# Patient Record
Sex: Male | Born: 1960 | Race: Black or African American | Hispanic: No | Marital: Married | Smoking: Never smoker
Health system: Southern US, Community
[De-identification: ages and names within clinical notes are randomized; demographics above are authoritative.]

## PROBLEM LIST (undated history)

## (undated) DIAGNOSIS — C9001 Multiple myeloma in remission: Secondary | ICD-10-CM

## (undated) HISTORY — PX: OTHER SURGICAL HISTORY: SHX169

## (undated) HISTORY — DX: Multiple myeloma in remission: C90.01

## (undated) HISTORY — PX: BACK SURGERY: SHX140

---

## 2003-06-09 ENCOUNTER — Inpatient Hospital Stay (HOSPITAL_COMMUNITY): Admission: AD | Admit: 2003-06-09 | Discharge: 2003-06-25 | Payer: Self-pay | Admitting: Neurosurgery

## 2003-06-14 ENCOUNTER — Encounter: Payer: Self-pay | Admitting: Hematology & Oncology

## 2003-11-23 ENCOUNTER — Ambulatory Visit (HOSPITAL_COMMUNITY): Admission: RE | Admit: 2003-11-23 | Discharge: 2003-11-23 | Payer: Self-pay | Admitting: Hematology and Oncology

## 2003-12-06 ENCOUNTER — Ambulatory Visit (HOSPITAL_COMMUNITY): Admission: RE | Admit: 2003-12-06 | Discharge: 2003-12-07 | Payer: Self-pay | Admitting: Interventional Radiology

## 2005-11-26 DIAGNOSIS — C9001 Multiple myeloma in remission: Secondary | ICD-10-CM

## 2005-11-26 HISTORY — DX: Multiple myeloma in remission: C90.01

## 2011-10-27 ENCOUNTER — Encounter: Payer: Medicare Other | Admitting: Hematology and Oncology

## 2011-10-27 DIAGNOSIS — C9000 Multiple myeloma not having achieved remission: Secondary | ICD-10-CM

## 2011-10-28 ENCOUNTER — Encounter: Payer: Medicare Other | Admitting: Hematology and Oncology

## 2011-10-28 DIAGNOSIS — M810 Age-related osteoporosis without current pathological fracture: Secondary | ICD-10-CM

## 2011-10-28 DIAGNOSIS — C9000 Multiple myeloma not having achieved remission: Secondary | ICD-10-CM

## 2011-11-25 ENCOUNTER — Encounter: Payer: Medicare Other | Admitting: Internal Medicine

## 2011-11-25 DIAGNOSIS — C9000 Multiple myeloma not having achieved remission: Secondary | ICD-10-CM

## 2011-12-02 ENCOUNTER — Encounter: Payer: Medicare Other | Admitting: Hematology and Oncology

## 2011-12-02 DIAGNOSIS — C9000 Multiple myeloma not having achieved remission: Secondary | ICD-10-CM

## 2012-02-23 DIAGNOSIS — C9000 Multiple myeloma not having achieved remission: Secondary | ICD-10-CM

## 2012-03-04 ENCOUNTER — Encounter: Payer: Medicare Other | Admitting: Hematology and Oncology

## 2012-03-04 DIAGNOSIS — M549 Dorsalgia, unspecified: Secondary | ICD-10-CM

## 2012-03-04 DIAGNOSIS — C9000 Multiple myeloma not having achieved remission: Secondary | ICD-10-CM

## 2012-03-04 DIAGNOSIS — Z23 Encounter for immunization: Secondary | ICD-10-CM

## 2012-04-26 DIAGNOSIS — C9000 Multiple myeloma not having achieved remission: Secondary | ICD-10-CM

## 2012-04-26 DIAGNOSIS — Z452 Encounter for adjustment and management of vascular access device: Secondary | ICD-10-CM

## 2012-05-24 DIAGNOSIS — C9000 Multiple myeloma not having achieved remission: Secondary | ICD-10-CM

## 2012-05-31 ENCOUNTER — Encounter: Payer: Medicare Other | Admitting: Internal Medicine

## 2012-05-31 DIAGNOSIS — K5900 Constipation, unspecified: Secondary | ICD-10-CM

## 2012-05-31 DIAGNOSIS — G893 Neoplasm related pain (acute) (chronic): Secondary | ICD-10-CM

## 2012-05-31 DIAGNOSIS — D649 Anemia, unspecified: Secondary | ICD-10-CM

## 2012-05-31 DIAGNOSIS — C9000 Multiple myeloma not having achieved remission: Secondary | ICD-10-CM

## 2012-07-05 DIAGNOSIS — C9000 Multiple myeloma not having achieved remission: Secondary | ICD-10-CM

## 2012-07-05 DIAGNOSIS — Z452 Encounter for adjustment and management of vascular access device: Secondary | ICD-10-CM

## 2012-08-23 DIAGNOSIS — C9000 Multiple myeloma not having achieved remission: Secondary | ICD-10-CM

## 2012-09-01 ENCOUNTER — Encounter: Payer: Medicare Other | Admitting: Internal Medicine

## 2012-09-01 DIAGNOSIS — D649 Anemia, unspecified: Secondary | ICD-10-CM

## 2012-09-01 DIAGNOSIS — G893 Neoplasm related pain (acute) (chronic): Secondary | ICD-10-CM

## 2012-09-01 DIAGNOSIS — C9000 Multiple myeloma not having achieved remission: Secondary | ICD-10-CM

## 2012-09-27 ENCOUNTER — Encounter: Payer: Medicare Other | Admitting: Internal Medicine

## 2012-09-27 DIAGNOSIS — C9000 Multiple myeloma not having achieved remission: Secondary | ICD-10-CM

## 2012-11-10 DIAGNOSIS — C9000 Multiple myeloma not having achieved remission: Secondary | ICD-10-CM

## 2012-11-24 ENCOUNTER — Encounter: Payer: Medicare Other | Admitting: Internal Medicine

## 2012-11-24 DIAGNOSIS — M549 Dorsalgia, unspecified: Secondary | ICD-10-CM

## 2012-11-24 DIAGNOSIS — Z9484 Stem cells transplant status: Secondary | ICD-10-CM

## 2012-11-24 DIAGNOSIS — C8291 Follicular lymphoma, unspecified, lymph nodes of head, face, and neck: Secondary | ICD-10-CM

## 2013-01-04 DIAGNOSIS — C9000 Multiple myeloma not having achieved remission: Secondary | ICD-10-CM

## 2013-01-04 DIAGNOSIS — Z452 Encounter for adjustment and management of vascular access device: Secondary | ICD-10-CM

## 2013-01-05 ENCOUNTER — Encounter: Payer: Self-pay | Admitting: *Deleted

## 2013-01-05 NOTE — Progress Notes (Signed)
Received confirmation of Revlimid shipment from [employer]

## 2013-02-02 ENCOUNTER — Telehealth: Payer: Self-pay | Admitting: *Deleted

## 2013-02-02 NOTE — Telephone Encounter (Signed)
[facility] faxed Revlimid refill request.  Request to provider for review. 

## 2013-02-03 ENCOUNTER — Telehealth: Payer: Self-pay

## 2013-02-03 NOTE — Telephone Encounter (Signed)
S/w Genia Del SW at [facility]. They are taking care of pt. I faxed revlimid refil request to  [phone].

## 2013-02-15 DIAGNOSIS — C9000 Multiple myeloma not having achieved remission: Secondary | ICD-10-CM

## 2013-02-15 DIAGNOSIS — D801 Nonfamilial hypogammaglobulinemia: Secondary | ICD-10-CM

## 2013-02-15 DIAGNOSIS — B009 Herpesviral infection, unspecified: Secondary | ICD-10-CM

## 2013-03-21 DIAGNOSIS — Z452 Encounter for adjustment and management of vascular access device: Secondary | ICD-10-CM

## 2013-03-21 DIAGNOSIS — C9000 Multiple myeloma not having achieved remission: Secondary | ICD-10-CM

## 2013-04-26 DIAGNOSIS — G8929 Other chronic pain: Secondary | ICD-10-CM | POA: Insufficient documentation

## 2013-05-09 DIAGNOSIS — C9000 Multiple myeloma not having achieved remission: Secondary | ICD-10-CM

## 2013-05-11 DIAGNOSIS — R609 Edema, unspecified: Secondary | ICD-10-CM

## 2013-05-11 DIAGNOSIS — Z23 Encounter for immunization: Secondary | ICD-10-CM

## 2013-05-11 DIAGNOSIS — C9000 Multiple myeloma not having achieved remission: Secondary | ICD-10-CM

## 2013-05-24 DIAGNOSIS — Z95828 Presence of other vascular implants and grafts: Secondary | ICD-10-CM | POA: Insufficient documentation

## 2013-12-20 DIAGNOSIS — N529 Male erectile dysfunction, unspecified: Secondary | ICD-10-CM | POA: Insufficient documentation

## 2014-01-19 ENCOUNTER — Encounter: Payer: Self-pay | Admitting: Oncology

## 2014-02-08 ENCOUNTER — Encounter: Payer: Self-pay | Admitting: Oncology

## 2014-06-30 DIAGNOSIS — Z95828 Presence of other vascular implants and grafts: Secondary | ICD-10-CM | POA: Diagnosis not present

## 2014-06-30 DIAGNOSIS — C9000 Multiple myeloma not having achieved remission: Secondary | ICD-10-CM | POA: Diagnosis not present

## 2014-07-06 DIAGNOSIS — M272 Inflammatory conditions of jaws: Secondary | ICD-10-CM | POA: Diagnosis not present

## 2014-08-07 DIAGNOSIS — Z95828 Presence of other vascular implants and grafts: Secondary | ICD-10-CM | POA: Diagnosis not present

## 2014-08-07 DIAGNOSIS — C9000 Multiple myeloma not having achieved remission: Secondary | ICD-10-CM | POA: Diagnosis not present

## 2014-08-14 DIAGNOSIS — M25512 Pain in left shoulder: Secondary | ICD-10-CM | POA: Diagnosis not present

## 2014-08-14 DIAGNOSIS — G8929 Other chronic pain: Secondary | ICD-10-CM | POA: Insufficient documentation

## 2014-08-14 DIAGNOSIS — C9000 Multiple myeloma not having achieved remission: Secondary | ICD-10-CM | POA: Diagnosis not present

## 2014-08-14 DIAGNOSIS — M25511 Pain in right shoulder: Secondary | ICD-10-CM

## 2014-08-21 DIAGNOSIS — M7500 Adhesive capsulitis of unspecified shoulder: Secondary | ICD-10-CM | POA: Diagnosis not present

## 2014-11-09 DIAGNOSIS — C9000 Multiple myeloma not having achieved remission: Secondary | ICD-10-CM | POA: Diagnosis not present

## 2014-11-10 DIAGNOSIS — Z95828 Presence of other vascular implants and grafts: Secondary | ICD-10-CM | POA: Diagnosis not present

## 2014-11-10 DIAGNOSIS — C9000 Multiple myeloma not having achieved remission: Secondary | ICD-10-CM | POA: Diagnosis not present

## 2014-11-29 DIAGNOSIS — M272 Inflammatory conditions of jaws: Secondary | ICD-10-CM | POA: Diagnosis not present

## 2014-11-29 DIAGNOSIS — M87180 Osteonecrosis due to drugs, jaw: Secondary | ICD-10-CM | POA: Diagnosis not present

## 2014-11-29 DIAGNOSIS — R9431 Abnormal electrocardiogram [ECG] [EKG]: Secondary | ICD-10-CM | POA: Diagnosis not present

## 2014-11-29 DIAGNOSIS — T458X5A Adverse effect of other primarily systemic and hematological agents, initial encounter: Secondary | ICD-10-CM | POA: Diagnosis not present

## 2014-12-04 DIAGNOSIS — Z95828 Presence of other vascular implants and grafts: Secondary | ICD-10-CM | POA: Diagnosis not present

## 2014-12-04 DIAGNOSIS — C9000 Multiple myeloma not having achieved remission: Secondary | ICD-10-CM | POA: Diagnosis not present

## 2014-12-12 DIAGNOSIS — C412 Malignant neoplasm of vertebral column: Secondary | ICD-10-CM | POA: Diagnosis not present

## 2014-12-12 DIAGNOSIS — M549 Dorsalgia, unspecified: Secondary | ICD-10-CM | POA: Diagnosis not present

## 2014-12-12 DIAGNOSIS — M879 Osteonecrosis, unspecified: Secondary | ICD-10-CM | POA: Diagnosis not present

## 2014-12-12 DIAGNOSIS — M009 Pyogenic arthritis, unspecified: Secondary | ICD-10-CM | POA: Diagnosis not present

## 2014-12-12 DIAGNOSIS — M8788 Other osteonecrosis, other site: Secondary | ICD-10-CM | POA: Diagnosis not present

## 2014-12-12 DIAGNOSIS — C9000 Multiple myeloma not having achieved remission: Secondary | ICD-10-CM | POA: Diagnosis not present

## 2014-12-12 DIAGNOSIS — G8929 Other chronic pain: Secondary | ICD-10-CM | POA: Diagnosis not present

## 2014-12-12 DIAGNOSIS — Z9221 Personal history of antineoplastic chemotherapy: Secondary | ICD-10-CM | POA: Diagnosis not present

## 2014-12-12 DIAGNOSIS — M272 Inflammatory conditions of jaws: Secondary | ICD-10-CM | POA: Diagnosis not present

## 2014-12-28 DIAGNOSIS — M272 Inflammatory conditions of jaws: Secondary | ICD-10-CM | POA: Diagnosis not present

## 2015-02-21 DIAGNOSIS — C9000 Multiple myeloma not having achieved remission: Secondary | ICD-10-CM | POA: Diagnosis not present

## 2015-02-22 DIAGNOSIS — Z95828 Presence of other vascular implants and grafts: Secondary | ICD-10-CM | POA: Diagnosis not present

## 2015-02-22 DIAGNOSIS — C9000 Multiple myeloma not having achieved remission: Secondary | ICD-10-CM | POA: Diagnosis not present

## 2015-02-26 DIAGNOSIS — C9000 Multiple myeloma not having achieved remission: Secondary | ICD-10-CM | POA: Diagnosis not present

## 2015-04-06 DIAGNOSIS — Z95828 Presence of other vascular implants and grafts: Secondary | ICD-10-CM | POA: Diagnosis not present

## 2015-07-02 DIAGNOSIS — C9001 Multiple myeloma in remission: Secondary | ICD-10-CM | POA: Diagnosis not present

## 2015-07-02 DIAGNOSIS — M25511 Pain in right shoulder: Secondary | ICD-10-CM | POA: Diagnosis not present

## 2015-07-02 DIAGNOSIS — C9000 Multiple myeloma not having achieved remission: Secondary | ICD-10-CM | POA: Diagnosis not present

## 2015-07-02 DIAGNOSIS — G8929 Other chronic pain: Secondary | ICD-10-CM | POA: Diagnosis not present

## 2015-08-24 DIAGNOSIS — Z95828 Presence of other vascular implants and grafts: Secondary | ICD-10-CM | POA: Diagnosis not present

## 2015-09-19 DIAGNOSIS — C9000 Multiple myeloma not having achieved remission: Secondary | ICD-10-CM | POA: Diagnosis not present

## 2015-09-24 DIAGNOSIS — M546 Pain in thoracic spine: Secondary | ICD-10-CM | POA: Diagnosis not present

## 2015-09-24 DIAGNOSIS — C9001 Multiple myeloma in remission: Secondary | ICD-10-CM | POA: Diagnosis not present

## 2015-09-24 DIAGNOSIS — M7501 Adhesive capsulitis of right shoulder: Secondary | ICD-10-CM | POA: Diagnosis not present

## 2015-09-24 DIAGNOSIS — G8929 Other chronic pain: Secondary | ICD-10-CM | POA: Diagnosis not present

## 2015-10-29 DIAGNOSIS — Z95828 Presence of other vascular implants and grafts: Secondary | ICD-10-CM | POA: Diagnosis not present

## 2015-10-29 DIAGNOSIS — C9000 Multiple myeloma not having achieved remission: Secondary | ICD-10-CM | POA: Diagnosis not present

## 2015-11-27 ENCOUNTER — Other Ambulatory Visit (HOSPITAL_COMMUNITY): Payer: Self-pay | Admitting: Oncology

## 2015-11-27 ENCOUNTER — Encounter (HOSPITAL_COMMUNITY): Payer: Self-pay | Admitting: Oncology

## 2015-11-27 DIAGNOSIS — C9001 Multiple myeloma in remission: Secondary | ICD-10-CM | POA: Insufficient documentation

## 2015-11-29 ENCOUNTER — Other Ambulatory Visit (HOSPITAL_COMMUNITY): Payer: Self-pay | Admitting: Oncology

## 2015-11-29 DIAGNOSIS — C9001 Multiple myeloma in remission: Secondary | ICD-10-CM

## 2015-12-26 ENCOUNTER — Encounter (HOSPITAL_COMMUNITY): Payer: Medicare Other | Attending: Hematology & Oncology

## 2015-12-26 DIAGNOSIS — C9001 Multiple myeloma in remission: Secondary | ICD-10-CM | POA: Insufficient documentation

## 2015-12-26 DIAGNOSIS — C9000 Multiple myeloma not having achieved remission: Secondary | ICD-10-CM | POA: Diagnosis not present

## 2015-12-26 DIAGNOSIS — Z452 Encounter for adjustment and management of vascular access device: Secondary | ICD-10-CM | POA: Diagnosis not present

## 2015-12-26 LAB — COMPREHENSIVE METABOLIC PANEL
ALT: 16 U/L — ABNORMAL LOW (ref 17–63)
AST: 22 U/L (ref 15–41)
Albumin: 4.1 g/dL (ref 3.5–5.0)
Alkaline Phosphatase: 54 U/L (ref 38–126)
Anion gap: 6 (ref 5–15)
BUN: 14 mg/dL (ref 6–20)
CO2: 26 mmol/L (ref 22–32)
Calcium: 8.2 mg/dL — ABNORMAL LOW (ref 8.9–10.3)
Chloride: 106 mmol/L (ref 101–111)
Creatinine, Ser: 0.86 mg/dL (ref 0.61–1.24)
GFR calc Af Amer: >60 mL/min (ref >60)
GFR calc non Af Amer: >60 mL/min (ref >60)
Glucose, Bld: 88 mg/dL (ref 65–99)
Potassium: 3.7 mmol/L (ref 3.5–5.1)
Sodium: 138 mmol/L (ref 135–145)
Total Bilirubin: 1.3 mg/dL — ABNORMAL HIGH (ref 0.3–1.2)
Total Protein: 7.1 g/dL (ref 6.5–8.1)

## 2015-12-26 LAB — CBC WITH DIFFERENTIAL/PLATELET
Basophils Absolute: 0 10*3/uL (ref 0.0–0.1)
Basophils Relative: 0 %
Eosinophils Absolute: 0.1 10*3/uL (ref 0.0–0.7)
Eosinophils Relative: 2 %
HCT: 39.6 % (ref 39.0–52.0)
Hemoglobin: 12.3 g/dL — ABNORMAL LOW (ref 13.0–17.0)
Lymphocytes Relative: 56 %
Lymphs Abs: 2.4 10*3/uL (ref 0.7–4.0)
MCH: 23.5 pg — ABNORMAL LOW (ref 26.0–34.0)
MCHC: 31.1 g/dL (ref 30.0–36.0)
MCV: 75.6 fL — ABNORMAL LOW (ref 78.0–100.0)
Monocytes Absolute: 0.4 10*3/uL (ref 0.1–1.0)
Monocytes Relative: 9 %
Neutro Abs: 1.4 10*3/uL — ABNORMAL LOW (ref 1.7–7.7)
Neutrophils Relative %: 33 %
Platelets: 138 10*3/uL — ABNORMAL LOW (ref 150–400)
RBC: 5.24 MIL/uL (ref 4.22–5.81)
RDW: 16.2 % — ABNORMAL HIGH (ref 11.5–15.5)
WBC: 4.2 10*3/uL (ref 4.0–10.5)

## 2015-12-26 LAB — LACTATE DEHYDROGENASE: LDH: 155 U/L (ref 98–192)

## 2015-12-26 LAB — SEDIMENTATION RATE: Sed Rate: 2 mm/hr (ref 0–16)

## 2015-12-26 LAB — C-REACTIVE PROTEIN: CRP: <0.5 mg/dL (ref <1.0)

## 2015-12-26 MED ORDER — HEPARIN SOD (PORK) LOCK FLUSH 100 UNIT/ML IV SOLN
500.0000 [IU] | Freq: Once | INTRAVENOUS | Status: AC
  Administered 2015-12-26: 500 [IU] via INTRAVENOUS
  Filled 2015-12-26: qty 5

## 2015-12-26 MED ORDER — METHADONE HCL 10 MG PO TABS: 10.0000 mg | ORAL_TABLET | Freq: Two times a day (BID) | ORAL | Status: DC

## 2015-12-26 MED ORDER — CHLORHEXIDINE GLUCONATE 0.12 % MT SOLN: 15.0000 mL | Freq: Two times a day (BID) | OROMUCOSAL | Status: DC

## 2015-12-26 MED ORDER — SODIUM CHLORIDE 0.9% FLUSH
10.0000 mL | INTRAVENOUS | Status: DC | PRN
  Administered 2015-12-26: 10 mL via INTRAVENOUS
  Filled 2015-12-26: qty 10

## 2015-12-26 NOTE — Patient Instructions (Addendum)
[place] at [facility] Discharge Instructions  RECOMMENDATIONS MADE BY THE CONSULTANT AND ANY TEST RESULTS WILL BE SENT TO YOUR REFERRING PHYSICIAN.  You had your port flushed with labs. We refilled your pain medicine today. I sent a refill on your mouthwash to the pharmacy. Follow up as scheduled. Call for any concerns or questions.  Thank you for choosing [place] at [facility] to provide your oncology and hematology care.  To afford each patient quality time with our provider, please arrive at least 15 minutes before your scheduled appointment time.   Beginning January 23rd 2017 lab work for the [facility] will be done in the  Main lab at [facility] on 1st floor. If you have a lab appointment with the [facility] please come in thru the  Main Entrance and check in at the main information desk  You need to re-schedule your appointment should you arrive 10 or more minutes late.  We strive to give you quality time with our providers, and arriving late affects you and other patients whose appointments are after yours.  Also, if you no show three or more times for appointments you may be dismissed from the clinic at the providers discretion.     Again, thank you for choosing [facility].  Our hope is that these requests will decrease the amount of time that you wait before being seen by our physicians.       _____________________________________________________________  Should you have questions after your visit to [facility], please contact our office at [phone] between the hours of 8:30 a.m. and 4:30 p.m.  Voicemails left after 4:30 p.m. will not be returned until the following business day.  For prescription refill requests, have your pharmacy contact our office.         Resources For Cancer Patients and their Caregivers ? American Cancer Society: Can assist with transportation, wigs, general  needs, runs Look Good Feel Better.        (223) 209-0905 ? Cancer Care: Provides financial assistance, online support groups, medication/co-pay assistance.  1-800-813-HOPE (559) 721-6258) ? [facility] Assists [place] cancer patients and their families through emotional , educational and financial support.  725-778-4776 ? [place] DSS Where to apply for food stamps, Medicaid and utility assistance. 269-535-4234 ? RCATS: Transportation to medical appointments. (763)506-8054 ? Social Security Administration: May apply for disability if have a Stage IV cancer. 4161599642 6802960716 ? [employer], Disability and Transit Services: Assists with nutrition, care and transit needs. [place] Support Programs: @10RELATIVEDAYS @ > Cancer Support Group  2nd Tuesday of the month 1pm-2pm, Journey Room  > Creative Journey  3rd Tuesday of the month 1130am-1pm, Journey Room  > Look Good Feel Better  1st Wednesday of the month 10am-12 noon, Journey Room (Call American Cancer Society to register [phone])        24-Hour Urine Collection HOW DO I DO A 24-HOUR URINE COLLECTION?  When you get up in the morning, urinate in the toilet and flush. Write down the time. This will be your start time on the day of collection and your end time on the next morning.  From then on, collect all of your urine in the plastic jug that is given to you.  Stop collecting your urine 24 hours after you started.  If the plastic jug that is given to you already has liquid in it, that is okay.  Do not throw out the liquid or rinse out the jug. Some tests need the liquid to be added to your urine.  Keep your plastic jug cool in an ice chest or keep it in the refrigerator during the test.  When 24 hours are over, bring your plastic jug to the clinic lab. Keep the jug cool in an ice chest while you are bringing it to the lab.   This information is not intended  to replace advice given to you by your health care provider. Make sure you discuss any questions you have with your health care provider.   Document Released: 08/29/2008 Document Revised: 06/23/2014 Document Reviewed: 10/26/2013 Elsevier Interactive Patient Education [employer].

## 2015-12-26 NOTE — Progress Notes (Signed)
Steven [name] presented for Portacath access and flush. Portacath located right chest wall accessed with  H 20 needle. Good blood return present. Portacath flushed with 23ml NS and 500U/13ml Heparin and needle removed intact. Procedure without incident. Patient tolerated procedure well.  Labs drawn per order

## 2015-12-27 LAB — IGG, IGA, IGM
IgA: 207 mg/dL (ref 90–386)
IgG (Immunoglobin G), Serum: 1087 mg/dL (ref 700–1600)
IgM, Serum: 89 mg/dL (ref 20–172)

## 2015-12-27 LAB — BETA 2 MICROGLOBULIN, SERUM: Beta-2 Microglobulin: 1.6 mg/L (ref 0.6–2.4)

## 2015-12-27 LAB — KAPPA/LAMBDA LIGHT CHAINS
Kappa free light chain: 14.6 mg/L (ref 3.3–19.4)
Kappa, lambda light chain ratio: 1.12 (ref 0.26–1.65)
Lambda free light chains: 13 mg/L (ref 5.7–26.3)

## 2015-12-28 LAB — PROTEIN ELECTROPHORESIS, SERUM
A/G Ratio: 1.3 (ref 0.7–1.7)
Albumin ELP: 3.8 g/dL (ref 2.9–4.4)
Alpha-1-Globulin: 0.2 g/dL (ref 0.0–0.4)
Alpha-2-Globulin: 0.5 g/dL (ref 0.4–1.0)
Beta Globulin: 1.2 g/dL (ref 0.7–1.3)
Gamma Globulin: 1.2 g/dL (ref 0.4–1.8)
Globulin, Total: 3 g/dL (ref 2.2–3.9)
Total Protein ELP: 6.8 g/dL (ref 6.0–8.5)

## 2015-12-28 LAB — IMMUNOFIXATION ELECTROPHORESIS
IgA: 214 mg/dL (ref 90–386)
IgG (Immunoglobin G), Serum: 1093 mg/dL (ref 700–1600)
IgM, Serum: 91 mg/dL (ref 20–172)
Total Protein ELP: 6.7 g/dL (ref 6.0–8.5)

## 2016-01-04 ENCOUNTER — Other Ambulatory Visit (HOSPITAL_COMMUNITY): Payer: Self-pay | Admitting: *Deleted

## 2016-01-04 DIAGNOSIS — C9001 Multiple myeloma in remission: Secondary | ICD-10-CM

## 2016-01-04 LAB — PROTEIN, URINE, 24 HOUR
Collection Interval-UPROT: 24 hours
Protein, 24H Urine: 63 mg/d (ref 50–100)
Protein, Urine: 7 mg/dL
Urine Total Volume-UPROT: 900 mL

## 2016-01-23 ENCOUNTER — Ambulatory Visit (HOSPITAL_COMMUNITY): Payer: Medicare Other | Admitting: Hematology & Oncology

## 2016-01-28 ENCOUNTER — Other Ambulatory Visit (HOSPITAL_COMMUNITY): Payer: Self-pay | Admitting: Oncology

## 2016-01-28 DIAGNOSIS — C9001 Multiple myeloma in remission: Secondary | ICD-10-CM

## 2016-01-28 MED ORDER — METHADONE HCL 10 MG PO TABS
10.0000 mg | ORAL_TABLET | Freq: Two times a day (BID) | ORAL | 0 refills | Status: DC
Start: 2016-01-28 — End: 2016-02-26

## 2016-02-26 ENCOUNTER — Encounter (HOSPITAL_COMMUNITY): Payer: Self-pay | Admitting: Oncology

## 2016-02-26 ENCOUNTER — Encounter (HOSPITAL_COMMUNITY): Payer: Medicare Other | Attending: Oncology | Admitting: Oncology

## 2016-02-26 VITALS — BP 163/80 | HR 66 | Temp 99.3°F | Resp 18 | Ht 66.0 in | Wt 172.6 lb

## 2016-02-26 DIAGNOSIS — D509 Iron deficiency anemia, unspecified: Secondary | ICD-10-CM

## 2016-02-26 DIAGNOSIS — C9001 Multiple myeloma in remission: Secondary | ICD-10-CM | POA: Diagnosis not present

## 2016-02-26 DIAGNOSIS — Z95828 Presence of other vascular implants and grafts: Secondary | ICD-10-CM

## 2016-02-26 DIAGNOSIS — D61818 Other pancytopenia: Secondary | ICD-10-CM

## 2016-02-26 LAB — IRON AND TIBC
Iron: 89 ug/dL (ref 45–182)
Saturation Ratios: 27 % (ref 17.9–39.5)
TIBC: 333 ug/dL (ref 250–450)
UIBC: 244 ug/dL

## 2016-02-26 LAB — COMPREHENSIVE METABOLIC PANEL
ALT: 14 U/L — ABNORMAL LOW (ref 17–63)
AST: 25 U/L (ref 15–41)
Albumin: 4.2 g/dL (ref 3.5–5.0)
Alkaline Phosphatase: 52 U/L (ref 38–126)
Anion gap: 3 — ABNORMAL LOW (ref 5–15)
BUN: 15 mg/dL (ref 6–20)
CO2: 25 mmol/L (ref 22–32)
Calcium: 8 mg/dL — ABNORMAL LOW (ref 8.9–10.3)
Chloride: 106 mmol/L (ref 101–111)
Creatinine, Ser: 0.81 mg/dL (ref 0.61–1.24)
GFR calc Af Amer: >60 mL/min (ref >60)
GFR calc non Af Amer: >60 mL/min (ref >60)
Glucose, Bld: 134 mg/dL — ABNORMAL HIGH (ref 65–99)
Potassium: 3.7 mmol/L (ref 3.5–5.1)
Sodium: 134 mmol/L — ABNORMAL LOW (ref 135–145)
Total Bilirubin: 1.2 mg/dL (ref 0.3–1.2)
Total Protein: 7.5 g/dL (ref 6.5–8.1)

## 2016-02-26 LAB — LACTATE DEHYDROGENASE: LDH: 198 U/L — ABNORMAL HIGH (ref 98–192)

## 2016-02-26 LAB — FERRITIN: Ferritin: 80 ng/mL (ref 24–336)

## 2016-02-26 MED ORDER — METHADONE HCL 10 MG PO TABS: 10.0000 mg | ORAL_TABLET | Freq: Two times a day (BID) | ORAL | 0 refills | Status: DC

## 2016-02-26 MED ORDER — HEPARIN SOD (PORK) LOCK FLUSH 100 UNIT/ML IV SOLN
500.0000 [IU] | Freq: Once | INTRAVENOUS | Status: DC
  Filled 2016-02-26: qty 5

## 2016-02-26 MED ORDER — SODIUM CHLORIDE 0.9% FLUSH: 10.0000 mL | INTRAVENOUS | Status: DC | PRN

## 2016-02-26 NOTE — Patient Instructions (Signed)
[place] at [facility] Discharge Instructions  RECOMMENDATIONS MADE BY THE CONSULTANT AND ANY TEST RESULTS WILL BE SENT TO YOUR REFERRING PHYSICIAN.  Port flush and labs today.  We will update you with results when they are available. You were provided a 3 month supply of your pain medication. Port flush in 6 weeks. Port flush and labs in 12 weeks. Return in 12 weeks for follow-up. You declined Influenza vaccine today. Please call us with any questions or concerns.  Thank you for choosing [place] at [facility] to provide your oncology and hematology care.  To afford each patient quality time with our provider, please arrive at least 15 minutes before your scheduled appointment time.   Beginning January 23rd 2017 lab work for the [facility] will be done in the  Main lab at [facility] on 1st floor. If you have a lab appointment with the [facility] please come in thru the  Main Entrance and check in at the main information desk  You need to re-schedule your appointment should you arrive 10 or more minutes late.  We strive to give you quality time with our providers, and arriving late affects you and other patients whose appointments are after yours.  Also, if you no show three or more times for appointments you may be dismissed from the clinic at the providers discretion.     Again, thank you for choosing [facility].  Our hope is that these requests will decrease the amount of time that you wait before being seen by our physicians.       _____________________________________________________________  Should you have questions after your visit to [facility], please contact our office at [phone] between the hours of 8:30 a.m. and 4:30 p.m.  Voicemails left after 4:30 p.m. will not be returned until the following business day.  For prescription refill requests, have your pharmacy contact our office.          Resources For Cancer Patients and their Caregivers ? American Cancer Society: Can assist with transportation, wigs, general needs, runs Look Good Feel Better.        272-344-5790 ? Cancer Care: Provides financial assistance, online support groups, medication/co-pay assistance.  1-800-813-HOPE 778 498 8925) ? [facility] Assists [place] cancer patients and their families through emotional , educational and financial support.  508-447-8359 ? [place] DSS Where to apply for food stamps, Medicaid and utility assistance. 306-108-1051 ? RCATS: Transportation to medical appointments. 872 168 2670 ? Social Security Administration: May apply for disability if have a Stage IV cancer. 6676863854 779 728 5528 ? [employer], Disability and Transit Services: Assists with nutrition, care and transit needs. [place] Support Programs: @10RELATIVEDAYS @ > Cancer Support Group  2nd Tuesday of the month 1pm-2pm, Journey Room  > Creative Journey  3rd Tuesday of the month 1130am-1pm, Journey Room  > Look Good Feel Better  1st Wednesday of the month 10am-12 noon, Journey Room (Call [facility] to register [phone])

## 2016-02-26 NOTE — Progress Notes (Signed)
[facility] Hematology/Oncology Consultation   Name: Steven [name]      MRN: [id number]    Date: 02/26/2016 Time:10:49 PM   REFERRING PHYSICIAN:  Everardo All, MD (Medical Oncology at [facility])  [place]:  Transfer of medical oncology care.   DIAGNOSIS:  Multiple myeloma, kappa light chain, presenting with back pain as a result of T2 vertebral body fracture    Multiple myeloma in remission ([place])   06/05/2003 Initial Diagnosis    Myeloma, presenting with excruciating back pain and T12 vertebral body fracture, kappa light chain multiple myeloma diagnosed with 2030 mg light chains per 24 hours and urine      06/09/2003 - 06/22/2003 Radiation Therapy    19 Gray to T11-L1 or T12 vertebral body fracture. Sublimity T2-T4 for T3 vertebral body involvement      06/26/2003 - 07/25/2004 Chemotherapy    Doxil, Velcade, and dexamethasone       02/27/2004 Bone Marrow Biopsy    Normal cellular bone marrow showing trilineage hematopoiesis with less than 10% plasma cells.      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  Followed by [facility] Department of dentistry.      06/22/2008 Progression    Relapse with The light chains in 24-hour urine found, therapy reinitiated with Revlimid/dexamethasone.      07/31/2008 - 08/10/2013 Chemotherapy    Revlimid/dexamethasone initiated for recurrent disease.       08/27/2010 - 09/10/2010 Radiation Therapy    2500 cGy to right hip for impending right femoral neck fracture      08/04/2013 Imaging    Bone density-normal with T score of -1.0.      01/19/2014 Imaging    MRI pelvis-partially visualized right L4-L5 and L5-S1 facet arthritis with periarticular edema. This can be associated with referred pain to the right hip. Small abnormal  marrow signal foci in the proximal right femur are likely representing treated myeloma. There is no cortical erosion, stress reaction or stress fracture. Similar lesions are present in the right supra-acetabular ileum.      02/08/2014 Imaging    Bone survey-no significant change in widespread myelomatous involvement of skeleton. Multiple pathologic fractures in the thoracic spine at L1 appears stable. No interval pathologic fracture identified.      02/26/2016 Treatment Plan Change    Transfer her medical oncology care to [facility]       HISTORY OF PRESENT ILLNESS:  Steven [name] is a 55 y.o. 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  Oncology history is outlined above.  He is doing well.  He denies any new complaints, however, he does have some chronic complaints that have been ongoing and at baseline.  He notes chronic back pain.  He also notes chronic peripheral neuropathy of his feet bilaterally.  It is unclear whether neuropathy is secondary to chemotherapy of from his vertebral body involvement and pathologic fractures.  The patient reports that the neuropathy occurred suddenly and simultaneous to diagnosis.  He cannot decipher the timing of this symptom.  Otherwise, he is doing well.  He is not on any current therapy at this time.  He was last seen by Dr. Tressie Stalker in April of this year:   Review of Systems  Constitutional: Negative for chills, fever and weight loss.  HENT: Negative.   Eyes: Negative.   Respiratory: Negative.   Cardiovascular: Negative.   Gastrointestinal: Negative.   Genitourinary:  Negative.   Musculoskeletal: Positive for back pain (chronic since time of dx).  Skin: Negative.   Neurological: Positive for tingling (feet bilaterally.). Negative for weakness.  Endo/Heme/Allergies: Negative.   Psychiatric/Behavioral: Negative.   14 point review of systems was performed and is negative except as detailed under history of present illness and above   PAST MEDICAL HISTORY:   Past Medical History:  Diagnosis Date  . Multiple myeloma in remission ([place]) 11/27/2015    ALLERGIES: Not on File    MEDICATIONS: I have reviewed the patient's current medications.    Current Outpatient Prescriptions on File Prior to Visit  Medication Sig Dispense Refill  . aspirin (GOODSENSE ASPIRIN) 325 MG tablet Take 325 mg by mouth.    . calcium citrate-vitamin D (CITRACAL+D) 315-200 MG-UNIT tablet Take by mouth.    . chlorhexidine (PERIDEX) 0.12 % solution Use as directed 15 mLs in the mouth or throat 2 (two) times daily. 825 mL 2  . folic acid (FOLVITE) 1 MG tablet Take 1 mg by mouth.    . lidocaine-prilocaine (EMLA) cream Apply 1 application topically as needed.    . polyethylene glycol (MIRALAX / GLYCOLAX) packet Take by mouth.    . potassium chloride SA (K-DUR,KLOR-CON) 20 MEQ tablet Take by mouth.    . Pramox-PE-Glycerin-Petrolatum (PREPARATION H) 1-0.25-14.4-15 % CREA Place rectally.    . sodium chloride (OCEAN) 0.65 % nasal spray Place into the nose.     No current facility-administered medications on file prior to visit.      PAST SURGICAL HISTORY History reviewed. No pertinent surgical history.  FAMILY HISTORY: Family History  Problem Relation Age of Onset  . Diabetes Mother   . Cancer Sister     SOCIAL HISTORY: He is a never smoker, never drinker, and no illicit drug abuse. Religion is Protestant. He is on disability secondary to multiple myeloma. Previously, he was a Magazine features editor.  Social History   Social History  . Marital status: Married    Spouse  name: N/A  . Number of children: N/A  . Years of education: N/A   Social History Main Topics  . Smoking status: Never Smoker  . Smokeless tobacco: Never Used  . Alcohol use No  . Drug use: No  . Sexual activity: Not Asked   Other Topics Concern  . None   Social History Narrative  . None    PERFORMANCE STATUS: The patient's performance status is 1 - Symptomatic but completely ambulatory  PHYSICAL EXAM: Most Recent Vital Signs: Blood pressure (!) 163/80, pulse 66, temperature 99.3 F (37.4 C), temperature source Oral, resp. rate 18, height 5' 6"  (1.676 m), weight 172 lb 9.6 oz (78.3 kg), SpO2 98 %. BP (!) 163/80 (BP Location: Left Arm)   Pulse 66   Temp 99.3 F (37.4 C) (Oral)   Resp 18   Ht 5' 6"  (1.676 m)   Wt 172 lb 9.6 oz (78.3 kg)   SpO2 98%   BMI 27.86 kg/m  General Appearance:    Alert, cooperative, no distress, appears stated age, Accompanied by his wife, Ivin Booty.   Head:    Normocephalic, without obvious abnormality, atraumatic  Eyes:    Conjunctiva/corneas clear, EOM's intact.       Ears:    External ears normal  Nose:   No sinus drainage     Throat:   Lips, mucosa, and tongue normal; fair dental condition   Neck:   Supple, symmetrical, trachea midline, no adenopathy;       thyroid:  No enlargement/tenderness/nodules  Back:     Back brace in place.  Lungs:     Clear to auscultation bilaterally, respirations unlabored  Chest wall:    No tenderness or deformity  Heart:    Regular rate and rhythm, S1 and S2 normal, no murmur, rub   or gallop  Abdomen:     Soft, non-tender, bowel sounds active all four quadrants,    no masses, no organomegaly        Extremities:   Extremities normal, atraumatic, no cyanosis or edema  Pulses:   2+ and symmetric all extremities  Skin:   Skin color, texture, turgor normal, no rashes or lesions  Lymph nodes:   Cervical, supraclavicular, and axillary nodes normal  Neurologic:   CNII-XII intact. Normal strength, sensation and  reflexes      Throughout. Mild stutter during conversation      LABORATORY DATA:  Results for orders placed or performed in visit on 02/26/16 (from the past 48 hour(s))  Comprehensive metabolic panel     Status: Abnormal   Collection Time: 02/26/16  5:00 PM  Result Value Ref Range   Sodium 134 (L) 135 - 145 mmol/L   Potassium 3.7 3.5 - 5.1 mmol/L   Chloride 106 101 - 111 mmol/L   CO2 25 22 - 32 mmol/L   Glucose, Bld 134 (H) 65 - 99 mg/dL   BUN 15 6 - 20 mg/dL   Creatinine, Ser 0.81 0.61 - 1.24 mg/dL   Calcium 8.0 (L) 8.9 - 10.3 mg/dL   Total Protein 7.5 6.5 - 8.1 g/dL   Albumin 4.2 3.5 - 5.0 g/dL   AST 25 15 - 41 U/L   ALT 14 (L) 17 - 63 U/L   Alkaline Phosphatase 52 38 - 126 U/L   Total Bilirubin 1.2 0.3 - 1.2 mg/dL   GFR calc non Af Amer >60 >60 mL/min   GFR calc Af Amer >60 >60 mL/min    Comment: (NOTE) The eGFR has been calculated using the CKD EPI equation. This calculation has not been validated in all clinical situations. eGFR's persistently <60 mL/min signify possible Chronic Kidney Disease.    Anion gap 3 (L) 5 - 15  Lactate dehydrogenase     Status: Abnormal   Collection Time: 02/26/16  5:00 PM  Result Value Ref Range   LDH 198 (H) 98 - 192 U/L  Iron and TIBC     Status: None   Collection Time: 02/26/16  5:00 PM  Result Value Ref Range   Iron 89 45 - 182 ug/dL   TIBC 333 250 - 450 ug/dL   Saturation Ratios 27 17.9 - 39.5 %   UIBC 244 ug/dL    Comment: Performed at [facility]  Ferritin     Status: None   Collection Time: 02/26/16  5:00 PM  Result Value Ref Range   Ferritin 80 24 - 336 ng/mL    Comment: Performed at [facility]  RADIOGRAPHY:              PATHOLOGY:        ASSESSMENT/PLAN: Multiple myeloma in remission, light chain disease Hx herpes zoster T2 vertebral body fracture Osteonecrosis of Jaw Microcytic anemia  Multiple myeloma, kappa light chain, presenting with back pain as a result of T2  vertebral body fracture, in remission.  Not on therapy at this time.  Bisphosphonate therapy discontinued due to osteonecrosis of jaw, followed by [facility] Dept of Dentistry.  Oncology history is developed.  Labs today: CBC diff, CMET, LDH, ESR, CRP, SPEP+IFE, B2M, IgG, IgM, IgA, light chain assay, iron/TIBC, ferritin.  Previous labs demonstrate a mild microcytic anemia.  Further peripheral work-up with iron studies are ordered for today. I suspect however, underlying thalassemia trait. Mild pancytopenia may be post transplant related and from prior years of therapy. Will continue to monitor.   Rx for Methadone x 3 months printed and provided to the patient.  Compliance will be monitored closely.  NCCN guidelines pertaining to surveillance of MM recommends annual bone surveys.  Last one performed in 2015 according to available records.  Order is placed for bone survey.  Port flushes every 6 weeks.  Labs in 12 weeks: CBC diff,  CMET, LDH, ESR, CRP, SPEP+IFE, B2M, IgG, IgM, IgA, light chain assay  Return in 12 weeks for follow-up.  NCCN guidelines pertaining to surveillance of multiple myeloma (active) are as follows (3.2017):  A. CBC diff  B. Serum quantitative immunoglobulins, SPEP + SIFE  C. 24 hr urine collection for total protein, UPEP, UIFE.  D. Serum BUN, creatinine, calcium  E. Serum FLC assay as clinically indicated.  F. Skeletal survey annually or for symptoms  G. Bone marrow aspiration and biopsy as clinically indicated.  H. Whole body low-dose CT scan as clinically indicated  I. Whole body MRI as clinically indicated  J. PET/CT scan as clinically indicated.  K. Assess minimal residual disease (MRD) as indicated.   ORDERS PLACED FOR THIS ENCOUNTER: Orders Placed This Encounter  Procedures  . DG Bone Survey Met  . CBC with Differential  . Comprehensive metabolic panel  . Lactate dehydrogenase  . Sedimentation rate  . Kappa/lambda light chains  . Beta 2 microglobuline, serum    . IgG, IgA, IgM  . Immunofixation electrophoresis  . Protein electrophoresis, serum  . Iron and TIBC  . Ferritin  . CBC with Differential  . Comprehensive metabolic panel  . Lactate dehydrogenase  . Sedimentation rate  . Kappa/lambda light chains  . Beta 2 microglobuline, serum  . IgG, IgA, IgM  . Protein electrophoresis, serum  . Immunofixation electrophoresis    MEDICATIONS PRESCRIBED THIS ENCOUNTER: Meds ordered this encounter  Medications  . DISCONTD: methadone (DOLOPHINE) 10 MG tablet    Sig: Take 1 tablet (10 mg total) by mouth every 12 (twelve) hours.    Dispense:  60 tablet    Refill:  0    Order Specific Question:   Supervising Provider    Answer:   Patrici Ranks [id number]  . DISCONTD: methadone (DOLOPHINE) 10 MG tablet    Sig: Take 1 tablet (10 mg total) by mouth every 12 (twelve) hours.    Dispense:  60 tablet    Refill:  0    To be filled on 03/25/2016    Order Specific Question:   Supervising Provider    Answer:   Patrici Ranks [[id number]]  . DISCONTD: methadone (DOLOPHINE) 10 MG tablet    Sig: Take 1  tablet (10 mg total) by mouth every 12 (twelve) hours.    Dispense:  60 tablet    Refill:  0    To be filled on 04/22/2016    Order Specific Question:   Supervising Provider    Answer:   Patrici Ranks [[id number]]  . methadone (DOLOPHINE) 10 MG tablet    Sig: Take 1 tablet (10 mg total) by mouth every 12 (twelve) hours.    Dispense:  60 tablet    Refill:  0    To be filled on 05/20/2016    Order Specific Question:   Supervising Provider    Answer:   Patrici Ranks [[id number]]  . heparin lock flush 100 unit/mL  . sodium chloride flush (NS) 0.9 % injection 10 mL    All questions were answered. The patient knows to call the clinic with any problems, questions or concerns. We can certainly see the patient much sooner if necessary.  This note is electronically signed TD:[email or web address] Cyril Mourning, MD  02/26/2016 10:49 PM

## 2016-02-26 NOTE — Assessment & Plan Note (Addendum)
Multiple myeloma, kappa light chain, presenting with back pain as a result of T2 vertebral body fracture, in remission.  Not on therapy at this time.  Bisphosphonate therapy discontinued due to osteonecrosis of jaw, followed by [facility] Dept of Dentistry.  Oncology history is developed.  Labs today: CBC diff, CMET, LDH, ESR, CRP, SPEP+IFE, B2M, IgG, IgM, IgA, light chain assay, iron/TIBC, ferritin.  I personally reviewed and went over laboratory results with the patient.  The results are noted within this dictation.  Previous labs demonstrate a mild microcytic anemia.  Further peripheral work-up with iron studies are ordered for today.  Rx for Methadone x 3 months printed and provided to the patient.  Compliance will be monitored closely.  NCCN guidelines pertaining to surveillance of MM recommends annual bone surveys.  Last one performed in 2015 according to available records.  Order is placed for bone survey.  Port flushes every 6 weeks.  Labs in 12 weeks: CBC diff,  CMET, LDH, ESR, CRP, SPEP+IFE, B2M, IgG, IgM, IgA, light chain assay  Return in 12 weeks for follow-up.  NCCN guidelines pertaining to surveillance of multiple myeloma (active) are as follows (3.2017):  A. CBC diff  B. Serum quantitative immunoglobulins, SPEP + SIFE  C. 24 hr urine collection for total protein, UPEP, UIFE.  D. Serum BUN, creatinine, calcium  E. Serum FLC assay as clinically indicated.  F. Skeletal survey annually or for symptoms  G. Bone marrow aspiration and biopsy as clinically indicated.  H. Whole body low-dose CT scan as clinically indicated  I. Whole body MRI as clinically indicated  J. PET/CT scan as clinically indicated.  K. Assess minimal residual disease (MRD) as indicated.

## 2016-02-27 LAB — PROTEIN ELECTROPHORESIS, SERUM
A/G Ratio: 1.2 (ref 0.7–1.7)
Albumin ELP: 3.9 g/dL (ref 2.9–4.4)
Alpha-1-Globulin: 0.3 g/dL (ref 0.0–0.4)
Alpha-2-Globulin: 0.7 g/dL (ref 0.4–1.0)
Beta Globulin: 1.2 g/dL (ref 0.7–1.3)
Gamma Globulin: 1.2 g/dL (ref 0.4–1.8)
Globulin, Total: 3.3 g/dL (ref 2.2–3.9)
Total Protein ELP: 7.2 g/dL (ref 6.0–8.5)

## 2016-02-27 LAB — IGG, IGA, IGM
IgA: 236 mg/dL (ref 90–386)
IgG (Immunoglobin G), Serum: 1164 mg/dL (ref 700–1600)
IgM, Serum: 103 mg/dL (ref 20–172)

## 2016-02-27 LAB — KAPPA/LAMBDA LIGHT CHAINS
Kappa free light chain: 12.5 mg/L (ref 3.3–19.4)
Kappa, lambda light chain ratio: 0.86 (ref 0.26–1.65)
Lambda free light chains: 14.5 mg/L (ref 5.7–26.3)

## 2016-02-27 LAB — BETA 2 MICROGLOBULIN, SERUM: Beta-2 Microglobulin: 1 mg/L (ref 0.6–2.4)

## 2016-02-29 LAB — IMMUNOFIXATION ELECTROPHORESIS
IgA: 244 mg/dL (ref 90–386)
IgG (Immunoglobin G), Serum: 1152 mg/dL (ref 700–1600)
IgM, Serum: 101 mg/dL (ref 20–172)
Total Protein ELP: 7.3 g/dL (ref 6.0–8.5)

## 2016-03-02 ENCOUNTER — Encounter (HOSPITAL_COMMUNITY): Payer: Self-pay | Admitting: Oncology

## 2016-03-04 ENCOUNTER — Telehealth (HOSPITAL_COMMUNITY): Payer: Self-pay | Admitting: *Deleted

## 2016-03-04 NOTE — Telephone Encounter (Signed)
-----   Message from [facility], PA-C sent at 03/03/2016 10:44 PM EDT ----- Stable

## 2016-03-04 NOTE — Telephone Encounter (Signed)
LMOM that labs were normal.

## 2016-04-22 ENCOUNTER — Ambulatory Visit (HOSPITAL_COMMUNITY)
Admission: RE | Admit: 2016-04-22 | Discharge: 2016-04-22 | Disposition: A | Discharge status: Home / Self Care | Payer: Medicare Other | Source: Ambulatory Visit | Attending: Oncology | Admitting: Oncology

## 2016-04-22 ENCOUNTER — Encounter (HOSPITAL_COMMUNITY): Payer: Medicare Other | Attending: Oncology

## 2016-04-22 VITALS — BP 154/81 | HR 66 | Temp 97.9°F | Resp 20

## 2016-04-22 DIAGNOSIS — C9001 Multiple myeloma in remission: Secondary | ICD-10-CM | POA: Diagnosis not present

## 2016-04-22 DIAGNOSIS — Z452 Encounter for adjustment and management of vascular access device: Secondary | ICD-10-CM | POA: Diagnosis present

## 2016-04-22 DIAGNOSIS — Z95828 Presence of other vascular implants and grafts: Secondary | ICD-10-CM

## 2016-04-22 MED ORDER — HEPARIN SOD (PORK) LOCK FLUSH 100 UNIT/ML IV SOLN
500.0000 [IU] | Freq: Once | INTRAVENOUS | Status: AC
  Administered 2016-04-22: 500 [IU] via INTRAVENOUS
  Filled 2016-04-22: qty 5

## 2016-04-22 MED ORDER — SODIUM CHLORIDE 0.9% FLUSH
10.0000 mL | Freq: Once | INTRAVENOUS | Status: AC
  Administered 2016-04-22: 10 mL via INTRAVENOUS

## 2016-04-22 NOTE — Progress Notes (Signed)
Steven [name] presented for Portacath access and flush. Proper placement of portacath confirmed by CXR. Portacath located right chest wall accessed with  H 20 needle. Good blood return present. Portacath flushed with 29ml NS and 500U/52ml Heparin and needle removed intact. Procedure without incident. Patient tolerated procedure well.

## 2016-04-22 NOTE — Patient Instructions (Signed)
[facility] at [facility] Discharge Instructions  RECOMMENDATIONS MADE BY THE CONSULTANT AND ANY TEST RESULTS WILL BE SENT TO YOUR REFERRING PHYSICIAN.  Port flush today as scheduled. Return as scheduled.  Thank you for choosing [facility] at [facility] to provide your oncology and hematology care.  To afford each patient quality time with our provider, please arrive at least 15 minutes before your scheduled appointment time.   Beginning January 23rd 2017 lab work for the Cancer Center will be done in the  Main lab at [place] on 1st floor. If you have a lab appointment with the Cancer Center please come in thru the  Main Entrance and check in at the main information desk  You need to re-schedule your appointment should you arrive 10 or more minutes late.  We strive to give you quality time with our providers, and arriving late affects you and other patients whose appointments are after yours.  Also, if you no show three or more times for appointments you may be dismissed from the clinic at the providers discretion.     Again, thank you for choosing [facility].  Our hope is that these requests will decrease the amount of time that you wait before being seen by our physicians.       _____________________________________________________________  Should you have questions after your visit to [facility], please contact our office at [phone] between the hours of 8:30 a.m. and 4:30 p.m.  Voicemails left after 4:30 p.m. will not be returned until the following business day.  For prescription refill requests, have your pharmacy contact our office.         Resources For Cancer Patients and their Caregivers ? American Cancer Society: Can assist with transportation, wigs, general needs, runs Look Good Feel Better.        1-888-227-6333 ? Cancer Care: Provides financial assistance, online support groups,  medication/co-pay assistance.  1-800-813-HOPE (4673) ? [facility] Assists [place] cancer patients and their families through emotional , educational and financial support.  336-427-4357 ? [place] DSS Where to apply for food stamps, Medicaid and utility assistance. 336-342-1394 ? RCATS: Transportation to medical appointments. 336-347-2287 ? Social Security Administration: May apply for disability if have a Stage IV cancer. 336-342-7796 1-800-772-1213 ? [place] Aging, Disability and Transit Services: Assists with nutrition, care and transit needs. 336-349-2343  Cancer Center Support Programs: @10RELATIVEDAYS@ > Cancer Support Group  2nd Tuesday of the month 1pm-2pm, Journey Room  > Creative Journey  3rd Tuesday of the month 1130am-1pm, Journey Room  > Look Good Feel Better  1st Wednesday of the month 10am-12 noon, Journey Room (Call American Cancer Society to register 1-800-395-5775)   

## 2016-05-29 ENCOUNTER — Encounter (HOSPITAL_COMMUNITY): Payer: Medicare Other | Attending: Oncology | Admitting: Hematology & Oncology

## 2016-05-29 ENCOUNTER — Other Ambulatory Visit (HOSPITAL_COMMUNITY): Payer: Medicare Other

## 2016-05-29 ENCOUNTER — Encounter (HOSPITAL_BASED_OUTPATIENT_CLINIC_OR_DEPARTMENT_OTHER): Payer: Medicare Other

## 2016-05-29 ENCOUNTER — Encounter (HOSPITAL_COMMUNITY): Payer: Self-pay | Admitting: Hematology & Oncology

## 2016-05-29 VITALS — Wt 174.3 lb

## 2016-05-29 DIAGNOSIS — D509 Iron deficiency anemia, unspecified: Secondary | ICD-10-CM | POA: Diagnosis not present

## 2016-05-29 DIAGNOSIS — T451X5A Adverse effect of antineoplastic and immunosuppressive drugs, initial encounter: Secondary | ICD-10-CM

## 2016-05-29 DIAGNOSIS — G620 Drug-induced polyneuropathy: Secondary | ICD-10-CM | POA: Diagnosis not present

## 2016-05-29 DIAGNOSIS — C9001 Multiple myeloma in remission: Secondary | ICD-10-CM

## 2016-05-29 DIAGNOSIS — Z452 Encounter for adjustment and management of vascular access device: Secondary | ICD-10-CM

## 2016-05-29 DIAGNOSIS — Z95828 Presence of other vascular implants and grafts: Secondary | ICD-10-CM

## 2016-05-29 LAB — CBC WITH DIFFERENTIAL/PLATELET
Basophils Absolute: 0 10*3/uL (ref 0.0–0.1)
Basophils Relative: 1 %
Eosinophils Absolute: 0.1 10*3/uL (ref 0.0–0.7)
Eosinophils Relative: 2 %
HCT: 40.2 % (ref 39.0–52.0)
Hemoglobin: 12.6 g/dL — ABNORMAL LOW (ref 13.0–17.0)
Lymphocytes Relative: 60 %
Lymphs Abs: 2.2 10*3/uL (ref 0.7–4.0)
MCH: 24 pg — ABNORMAL LOW (ref 26.0–34.0)
MCHC: 31.3 g/dL (ref 30.0–36.0)
MCV: 76.4 fL — ABNORMAL LOW (ref 78.0–100.0)
Monocytes Absolute: 0.2 10*3/uL (ref 0.1–1.0)
Monocytes Relative: 6 %
Neutro Abs: 1.2 10*3/uL — ABNORMAL LOW (ref 1.7–7.7)
Neutrophils Relative %: 32 %
Platelets: 100 10*3/uL — ABNORMAL LOW (ref 150–400)
RBC: 5.26 MIL/uL (ref 4.22–5.81)
RDW: 16.9 % — ABNORMAL HIGH (ref 11.5–15.5)
WBC: 3.8 10*3/uL — ABNORMAL LOW (ref 4.0–10.5)

## 2016-05-29 LAB — COMPREHENSIVE METABOLIC PANEL
ALT: 12 U/L — ABNORMAL LOW (ref 17–63)
AST: 20 U/L (ref 15–41)
Albumin: 4 g/dL (ref 3.5–5.0)
Alkaline Phosphatase: 47 U/L (ref 38–126)
Anion gap: 6 (ref 5–15)
BUN: 11 mg/dL (ref 6–20)
CO2: 26 mmol/L (ref 22–32)
Calcium: 8.3 mg/dL — ABNORMAL LOW (ref 8.9–10.3)
Chloride: 105 mmol/L (ref 101–111)
Creatinine, Ser: 0.8 mg/dL (ref 0.61–1.24)
GFR calc Af Amer: >60 mL/min (ref >60)
GFR calc non Af Amer: >60 mL/min (ref >60)
Glucose, Bld: 108 mg/dL — ABNORMAL HIGH (ref 65–99)
Potassium: 3.3 mmol/L — ABNORMAL LOW (ref 3.5–5.1)
Sodium: 137 mmol/L (ref 135–145)
Total Bilirubin: 1.4 mg/dL — ABNORMAL HIGH (ref 0.3–1.2)
Total Protein: 7.1 g/dL (ref 6.5–8.1)

## 2016-05-29 LAB — LACTATE DEHYDROGENASE: LDH: 152 U/L (ref 98–192)

## 2016-05-29 LAB — SEDIMENTATION RATE: Sed Rate: 1 mm/hr (ref 0–16)

## 2016-05-29 MED ORDER — SODIUM CHLORIDE 0.9% FLUSH
10.0000 mL | INTRAVENOUS | Status: DC | PRN
  Administered 2016-05-29: 10 mL via INTRAVENOUS
  Filled 2016-05-29: qty 10

## 2016-05-29 MED ORDER — METHADONE HCL 10 MG PO TABS: 10.0000 mg | ORAL_TABLET | Freq: Two times a day (BID) | ORAL | 0 refills | Status: DC

## 2016-05-29 MED ORDER — CHLORHEXIDINE GLUCONATE 0.12 % MT SOLN: 15.0000 mL | Freq: Two times a day (BID) | OROMUCOSAL | 6 refills | Status: DC

## 2016-05-29 MED ORDER — HEPARIN SOD (PORK) LOCK FLUSH 100 UNIT/ML IV SOLN
500.0000 [IU] | Freq: Once | INTRAVENOUS | Status: AC
  Administered 2016-05-29: 500 [IU] via INTRAVENOUS

## 2016-05-29 NOTE — Patient Instructions (Signed)
[name] at [facility] Discharge Instructions  RECOMMENDATIONS MADE BY THE CONSULTANT AND ANY TEST RESULTS WILL BE SENT TO YOUR REFERRING PHYSICIAN.  Port flush with labs done. Follow up as scheduled.  Thank you for choosing  [name] at [facility] to provide your oncology and hematology care.  To afford each patient quality time with our provider, please arrive at least 15 minutes before your scheduled appointment time.   Beginning January 23rd 2017 lab work for the [name] will be done in the  Main lab at [place] on 1st floor. If you have a lab appointment with the [name] please come in thru the  Main Entrance and check in at the main information desk  You need to re-schedule your appointment should you arrive 10 or more minutes late.  We strive to give you quality time with our providers, and arriving late affects you and other patients whose appointments are after yours.  Also, if you no show three or more times for appointments you may be dismissed from the clinic at the providers discretion.     Again, thank you for choosing [name].  Our hope is that these requests will decrease the amount of time that you wait before being seen by our physicians.       _____________________________________________________________  Should you have questions after your visit to [name], please contact our office at [phone] between the hours of 8:30 a.m. and 4:30 p.m.  Voicemails left after 4:30 p.m. will not be returned until the following business day.  For prescription refill requests, have your pharmacy contact our office.         Resources For Cancer Patients and their Caregivers ? American Cancer Society: Can assist with transportation, wigs, general needs, runs Look Good Feel Better.        1-888-227-6333 ? Cancer Care: Provides financial assistance, online support groups,  medication/co-pay assistance.  1-800-813-HOPE (4673) ? [name] Assists [place] cancer patients and their families through emotional , educational and financial support.  336-427-4357 ? [place] DSS Where to apply for food stamps, Medicaid and utility assistance. 336-342-1394 ? RCATS: Transportation to medical appointments. 336-347-2287 ? Social Security Administration: May apply for disability if have a Stage IV cancer. 336-342-7796 1-800-772-1213 ? [place] Aging, Disability and Transit Services: Assists with nutrition, care and transit needs. 336-349-2343  [name] Support Programs: @10RELATIVEDAYS@ > Cancer Support Group  2nd Tuesday of the month 1pm-2pm, Journey Room  > Creative Journey  3rd Tuesday of the month 1130am-1pm, Journey Room  > Look Good Feel Better  1st Wednesday of the month 10am-12 noon, Journey Room (Call American Cancer Society to register 1-800-395-5775)   

## 2016-05-29 NOTE — Patient Instructions (Signed)
[place] at [facility] Discharge Instructions  RECOMMENDATIONS MADE BY THE CONSULTANT AND ANY TEST RESULTS WILL BE SENT TO YOUR REFERRING PHYSICIAN.  You saw Dr.Penland today. Follow up in 3 months with labs You have methadone prescriptions for Jan- Feb- March See [name] at checkout for appointments.  Thank you for choosing [place] at [facility] to provide your oncology and hematology care.  To afford each patient quality time with our provider, please arrive at least 15 minutes before your scheduled appointment time.   Beginning January 23rd 2017 lab work for the [facility] will be done in the  Main lab at [facility] on 1st floor. If you have a lab appointment with the [facility] please come in thru the  Main Entrance and check in at the main information desk  You need to re-schedule your appointment should you arrive 10 or more minutes late.  We strive to give you quality time with our providers, and arriving late affects you and other patients whose appointments are after yours.  Also, if you no show three or more times for appointments you may be dismissed from the clinic at the providers discretion.     Again, thank you for choosing [facility].  Our hope is that these requests will decrease the amount of time that you wait before being seen by our physicians.       _____________________________________________________________  Should you have questions after your visit to [facility], please contact our office at [phone] between the hours of 8:30 a.m. and 4:30 p.m.  Voicemails left after 4:30 p.m. will not be returned until the following business day.  For prescription refill requests, have your pharmacy contact our office.         Resources For Cancer Patients and their Caregivers ? American Cancer Society: Can assist with transportation, wigs, general needs, runs Look Good Feel Better.         951-504-0686 ? Cancer Care: Provides financial assistance, online support groups, medication/co-pay assistance.  1-800-813-HOPE 510 854 4536) ? [facility] Assists [place] cancer patients and their families through emotional , educational and financial support.  (639) 739-5193 ? [place] DSS Where to apply for food stamps, Medicaid and utility assistance. 506-754-2668 ? RCATS: Transportation to medical appointments. 254-018-5670 ? Social Security Administration: May apply for disability if have a Stage IV cancer. 916-287-4486 334 766 5618 ? [employer], Disability and Transit Services: Assists with nutrition, care and transit needs. [place] Support Programs: @10RELATIVEDAYS @ > Cancer Support Group  2nd Tuesday of the month 1pm-2pm, Journey Room  > Creative Journey  3rd Tuesday of the month 1130am-1pm, Journey Room  > Look Good Feel Better  1st Wednesday of the month 10am-12 noon, Journey Room (Call [place] to register [phone])

## 2016-05-29 NOTE — Progress Notes (Addendum)
[facility] Hematology/Oncology Consultation   Name: Steven [name]      MRN: [id number]    Date: 05/29/2016 Time:2:17 PM   REFERRING PHYSICIAN:  Everardo All, MD (Medical Oncology at [facility])  [place]:  Transfer of medical oncology care.   DIAGNOSIS:  Multiple myeloma, kappa light chain, presenting with back pain as a result of T2 vertebral body fracture    Multiple myeloma in remission ([place])   06/05/2003 Initial Diagnosis    Myeloma, presenting with excruciating back pain and T12 vertebral body fracture, kappa light chain multiple myeloma diagnosed with 2030 mg light chains per 24 hours and urine      06/09/2003 - 06/22/2003 Radiation Therapy    19 [place] to T11-L1 or T12 vertebral body fracture. [place] T2-T4 for T3 vertebral body involvement      06/26/2003 - 07/25/2004 Chemotherapy    Doxil, Velcade, and dexamethasone       02/27/2004 Bone Marrow Biopsy    Normal cellular bone marrow showing trilineage hematopoiesis with less than 10% plasma cells.      07/25/2004 Bone Marrow Transplant    Transplant after high-dose chemotherapy with melphalan 200 mg/m, but no maintenance therapy was given, transplant at [facility].      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  Followed by [facility] Department of dentistry.      06/22/2008 Progression    Relapse with The light chains in 24-hour urine found, therapy reinitiated with Revlimid/dexamethasone.      07/31/2008 - 08/10/2013 Chemotherapy    Revlimid/dexamethasone initiated for recurrent disease.       08/27/2010 - 09/10/2010 Radiation Therapy    2500 cGy to right hip for impending right femoral neck fracture      08/04/2013 Imaging    Bone density-normal with T score of -1.0.      01/19/2014 Imaging    MRI pelvis-partially visualized right L4-L5 and L5-S1 facet arthritis with periarticular edema. This can be associated with referred pain to the right hip. Small abnormal  marrow signal foci in the proximal right femur are likely representing treated myeloma. There is no cortical erosion, stress reaction or stress fracture. Similar lesions are present in the right supra-acetabular ileum.      02/08/2014 Imaging    Bone survey-no significant change in widespread myelomatous involvement of skeleton. Multiple pathologic fractures in the thoracic spine at L1 appears stable. No interval pathologic fracture identified.      02/26/2016 Treatment Plan Change    Transfer her medical oncology care to [facility]       HISTORY OF PRESENT ILLNESS:  Steven [name] is a 55 y.o. 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  Oncology history is outlined above.  Patient is accompanied by wife. He has had port flushed and labs drawn today.  Denies new pain or GU issues. Reports normal appetite.  He tries to walk on treadmill daily. He notes that he tries to stay physically active.   Needs refill on methadone and requested I print script for Magic Mouthwash. He takes methadone twice a day. Pain is  fairly controlled. Neuropathy is his major complaint. He he tried multiple therapies in the past but activity and methadone are working the best.  He denies falls. No new pain.  Review of Systems  Constitutional: Negative for chills, fever and weight loss.  HENT: Negative.   Eyes: Negative.   Respiratory: Negative.   Cardiovascular: Negative.   Gastrointestinal: Negative.   Genitourinary: Negative.   Musculoskeletal: Positive for back pain (chronic since time of dx).  Skin: Negative.   Neurological: Positive for tingling  (feet bilaterally.). Negative for weakness.  Endo/Heme/Allergies: Negative.   Psychiatric/Behavioral: Negative.   14 point review of systems was performed and is negative except as detailed under history of present illness and above   PAST MEDICAL HISTORY:   Past Medical History:  Diagnosis Date  . Multiple myeloma in remission ([place]) 11/27/2015    ALLERGIES: Not on File    MEDICATIONS: I have reviewed the patient's current medications.    Current Outpatient Prescriptions on File Prior to Visit  Medication Sig Dispense Refill  . aspirin (GOODSENSE ASPIRIN) 325 MG tablet Take 325 mg by mouth.    . calcium citrate-vitamin D (CITRACAL+D) 315-200 MG-UNIT tablet Take by mouth.    . chlorhexidine (PERIDEX) 0.12 % solution Use as directed 15 mLs in the mouth or throat 2 (two) times daily. 867 mL 2  . folic acid (FOLVITE) 1 MG tablet Take 1 mg by mouth.    . lidocaine-prilocaine (EMLA) cream Apply 1 application topically as needed.    . methadone (DOLOPHINE) 10 MG tablet Take 1 tablet (10 mg total) by mouth every 12 (twelve) hours. 60 tablet 0  . polyethylene glycol (MIRALAX / GLYCOLAX) packet Take by mouth.    . potassium chloride SA (K-DUR,KLOR-CON) 20 MEQ tablet Take by mouth.     No current facility-administered medications on file prior to visit.      PAST SURGICAL HISTORY History reviewed. No pertinent surgical history.  FAMILY HISTORY: Family History  Problem Relation Age of Onset  . Diabetes Mother   . Cancer Sister     SOCIAL HISTORY: He is a never smoker, never drinker, and no illicit drug abuse. Religion is Protestant. He is on disability secondary to multiple myeloma. Previously, he was a Magazine features editor.  Social History   Social History  . Marital status: Married    Spouse name: N/A  . Number of children: N/A  . Years of education: N/A   Social History Main Topics  . Smoking status: Never Smoker  . Smokeless tobacco: Never Used  . Alcohol use No  .  Drug use: No  . Sexual activity: Not Asked   Other Topics Concern  . None   Social History Narrative  . None    PERFORMANCE STATUS: The patient's performance status is 1 - Symptomatic but completely ambulatory  PHYSICAL EXAM: Most Recent Vital Signs: Weight 174 lb 4.8 oz (79.1 kg). Wt 174 lb 4.8 oz (79.1 kg)   BMI 28.13 kg/m   General Appearance:    Alert, cooperative, no distress, appears stated age, Accompanied by his wife, Ivin Booty.   Head:    Normocephalic, without obvious abnormality, atraumatic  Eyes:    Conjunctiva/corneas clear, EOM's intact.       Ears:    External ears normal  Nose:   No sinus drainage     Throat:   Lips, mucosa, and tongue normal; fair dental condition   Neck:   Supple, symmetrical, trachea midline, no adenopathy;  thyroid:  No enlargement/tenderness/nodules  Back:     Back brace in place.  Lungs:     Clear to auscultation bilaterally, respirations unlabored  Chest wall:    No tenderness or deformity  Heart:    Regular rate and rhythm, S1 and S2 normal, no murmur, rub   or gallop  Abdomen:     Soft, non-tender, bowel sounds active all four quadrants,    no masses, no organomegaly        Extremities:   Extremities normal, atraumatic, no cyanosis or edema  Pulses:   2+ and symmetric all extremities  Skin:   Skin color, texture, turgor normal, no rashes or lesions  Lymph nodes:   Cervical, supraclavicular, and axillary nodes normal  Neurologic:   CNII-XII intact. Normal strength, sensation and reflexes      Throughout. Mild stutter during conversation      LABORATORY DATA:  No results found for this or any previous visit (from the past 48 hour(s)).    Results for WEBER, MONNIER (MRN [id number])    Ref. Range 05/29/2016 13:09  Sodium Latest Ref Range: 135 - 145 mmol/L 137  Potassium Latest Ref Range: 3.5 - 5.1 mmol/L 3.3 (L)  Chloride Latest Ref Range: 101 - 111 mmol/L 105  CO2 Latest Ref Range: 22 - 32 mmol/L 26  BUN Latest Ref  Range: 6 - 20 mg/dL 11  Creatinine Latest Ref Range: 0.61 - 1.24 mg/dL 0.80  Calcium Latest Ref Range: 8.9 - 10.3 mg/dL 8.3 (L)  EGFR (Non-African Amer.) Latest Ref Range: >60 mL/min >60  EGFR (African American) Latest Ref Range: >60 mL/min >60  Glucose Latest Ref Range: 65 - 99 mg/dL 108 (H)  Anion gap Latest Ref Range: 5 - 15  6  Alkaline Phosphatase Latest Ref Range: 38 - 126 U/L 47  Albumin Latest Ref Range: 3.5 - 5.0 g/dL 4.0  AST Latest Ref Range: 15 - 41 U/L 20  ALT Latest Ref Range: 17 - 63 U/L 12 (L)  Total Protein Latest Ref Range: 6.5 - 8.1 g/dL 7.1  Total Bilirubin Latest Ref Range: 0.3 - 1.2 mg/dL 1.4 (H)  LDH Latest Ref Range: 98 - 192 U/L 152  Beta-2 Microglobulin Latest Ref Range: 0.6 - 2.4 mg/L 1.3   Results for ROREY, BISSON (MRN [id number])   Ref. Range 05/29/2016 13:09  WBC Latest Ref Range: 4.0 - 10.5 K/uL 3.8 (L)  RBC Latest Ref Range: 4.22 - 5.81 MIL/uL 5.26  Hemoglobin Latest Ref Range: 13.0 - 17.0 g/dL 12.6 (L)  HCT Latest Ref Range: 39.0 - 52.0 % 40.2  MCV Latest Ref Range: 78.0 - 100.0 fL 76.4 (L)  MCH Latest Ref Range: 26.0 - 34.0 pg 24.0 (L)  MCHC Latest Ref Range: 30.0 - 36.0 g/dL 31.3  RDW Latest Ref Range: 11.5 - 15.5 % 16.9 (H)  Platelets Latest Ref Range: 150 - 400 K/uL 100 (L)  Neutrophils Latest Units: % 32  Lymphocytes Latest Units: % 60  Monocytes Relative Latest Units: % 6  Eosinophil Latest Units: % 2  Basophil Latest Units: % 1  NEUT# Latest Ref Range: 1.7 - 7.7 K/uL 1.2 (L)  Lymphocyte # Latest Ref Range: 0.7 - 4.0 K/uL 2.2  Monocyte # Latest Ref Range: 0.1 - 1.0 K/uL 0.2  Eosinophils Absolute Latest Ref Range: 0.0 - 0.7 K/uL 0.1  Basophils Absolute Latest Ref Range: 0.0 - 0.1 K/uL 0.0  Sed Rate Latest Ref Range: 0 - 16 mm/hr 1  Glucose Latest Ref  Range: 65 - 99 mg/dL 108 (H)  IgG (Immunoglobin G), Serum Latest Ref Range: 700 - 1,600 mg/dL 1,045  IgA Latest Ref Range: 90 - 386 mg/dL 241                 RADIOGRAPHY: Study Result   CLINICAL DATA:  Multiple myeloma in remission  EXAM: METASTATIC BONE SURVEY  COMPARISON:  Metastatic bone survey of February 08, 2014  FINDINGS: Numerous stable subcentimeter lucencies are noted within the frontal and parietal and occipital bones.  Spine: There is stable compression of the body of T12. Stable post kyphoplasty changes at L1 are present. Mild loss of height of the bodies of T9 and 10 are stable. Mild compression of T3 is faintly visible and grossly stable. There is stable multilevel facet joint hypertrophy of the lumbar spine.  Chest: The lungs are well-expanded and clear. The heart and pulmonary vascularity are normal. The Port-A-Cath tip projects over the midportion of the SVC. There is no pleural effusion. There is stable expansile remodeling of the anterior aspect of the left seventh rib. There are subtle lucencies within the clavicles which are stable.  Upper extremities: Numerous stable lucencies are noted in the shafts of the humeri. There also lucencies in the proximal shaft of the right radius.  Pelvis and lower extremities: There is heterogeneous mineralization throughout the bony pelvis similar to that seen previously. Multiple tiny lucencies are present. There is no fracture. The hip joint spaces are preserved. Within the femurs there are multiple tiny lucencies bilaterally. There are stable 2-3 mm lucencies in the shaft of the left fibula.  IMPRESSION: Stable appearance of widespread myelomatous involvement of the skeleton. No progressive lesions are observed. Stable appearing partial compressions of thoracic vertebral bodies. Stable vertebra plana at T12.   Electronically Signed   By: David  Martinique M.D.   On: 04/23/2016 07:10                PATHOLOGY:        ASSESSMENT/PLAN: Multiple myeloma in remission, light chain disease Hx herpes zoster T2 vertebral  body fracture Osteonecrosis of Jaw Microcytic anemia Chemotherapy induced neuropathy  Multiple myeloma, kappa light chain, presenting with back pain as a result of T2 vertebral body fracture, in remission.  Not on therapy at this time.  Bisphosphonate therapy discontinued due to osteonecrosis of jaw, followed by [facility] Dept of Dentistry.  Patient is pleasant. He is in remission for multiple myeloma.  [place] had a bone survey on 04/22/16 indicating stable appearance of widespread myelomatous involvement of the skeleton. Reviewed results with patient. Results are noted above.   He had port flushed and labs drawn today.  Refilled patient's medication. Continue physical activity/methadone for neuropathy.  RTC in 6 weeks.   NCCN guidelines pertaining to surveillance of multiple myeloma (active) are as follows (3.2017):  A. CBC diff  B. Serum quantitative immunoglobulins, SPEP + SIFE  C. 24 hr urine collection for total protein, UPEP, UIFE.  D. Serum BUN, creatinine, calcium  E. Serum FLC assay as clinically indicated.  F. Skeletal survey annually or for symptoms  G. Bone marrow aspiration and biopsy as clinically indicated.  H. Whole body low-dose CT scan as clinically indicated  I. Whole body MRI as clinically indicated  J. PET/CT scan as clinically indicated.  K. Assess minimal residual disease (MRD) as indicated.  ORDERS PLACED FOR THIS ENCOUNTER: No orders of the defined types were placed in this encounter.  Orders Placed This Encounter  Procedures  .  CBC with Differential    Standing Status:   Future    Standing Expiration Date:   05/29/2017  . Comprehensive metabolic panel    Standing Status:   Future    Standing Expiration Date:   05/29/2017  . Lactate dehydrogenase    Standing Status:   Future    Standing Expiration Date:   05/29/2017  . Sedimentation rate    Standing Status:   Future    Standing Expiration Date:   05/29/2017  . Kappa/lambda light chains    Standing  Status:   Future    Standing Expiration Date:   05/29/2017  . Beta 2 microglobuline, serum    Standing Status:   Future    Standing Expiration Date:   05/29/2017  . IgG, IgA, IgM    Standing Status:   Future    Standing Expiration Date:   05/29/2017  . Immunofixation electrophoresis    Standing Status:   Future    Standing Expiration Date:   05/29/2017  . Protein electrophoresis, serum    Standing Status:   Future    Standing Expiration Date:   05/29/2017   All questions were answered. The patient knows to call the clinic with any problems, questions or concerns. We can certainly see the patient much sooner if necessary.  This document serves as a record of services personally performed by Ancil Linsey, MD. It was created on her behalf by Elmyra Ricks, a trained medical scribe. The creation of this record is based on the scribe's personal observations and the provider's statements to them. This document has been checked and approved by the attending provider.  I have reviewed the above documentation for accuracy and completeness and I agree with the above.  This note is electronically signed [email or web address] Cyril Mourning, MD  05/29/2016 2:17 PM

## 2016-05-29 NOTE — Progress Notes (Signed)
Steven [name] presented for Portacath access and flush. Portacath located right chest wall accessed with  H 20 needle. Good blood return present. Portacath flushed with 20ml NS and 500U/5ml Heparin and needle removed intact. Procedure without incident. Patient tolerated procedure well.  Labs drawn per orders.    

## 2016-05-30 ENCOUNTER — Other Ambulatory Visit (HOSPITAL_COMMUNITY): Payer: Self-pay

## 2016-05-30 DIAGNOSIS — C9001 Multiple myeloma in remission: Secondary | ICD-10-CM

## 2016-05-30 LAB — IGG, IGA, IGM
IgA: 241 mg/dL (ref 90–386)
IgG (Immunoglobin G), Serum: 1045 mg/dL (ref 700–1600)
IgM, Serum: 97 mg/dL (ref 20–172)

## 2016-05-30 LAB — IMMUNOFIXATION ELECTROPHORESIS
IgA: 229 mg/dL (ref 90–386)
IgG (Immunoglobin G), Serum: 1042 mg/dL (ref 700–1600)
IgM, Serum: 96 mg/dL (ref 20–172)
Total Protein ELP: 6.8 g/dL (ref 6.0–8.5)

## 2016-05-30 LAB — KAPPA/LAMBDA LIGHT CHAINS
Kappa free light chain: 9.2 mg/L (ref 3.3–19.4)
Kappa, lambda light chain ratio: 0.93 (ref 0.26–1.65)
Lambda free light chains: 9.9 mg/L (ref 5.7–26.3)

## 2016-05-30 LAB — BETA 2 MICROGLOBULIN, SERUM: Beta-2 Microglobulin: 1.3 mg/L (ref 0.6–2.4)

## 2016-05-30 MED ORDER — POTASSIUM CHLORIDE CRYS ER 20 MEQ PO TBCR: 40.0000 meq | EXTENDED_RELEASE_TABLET | Freq: Every day | ORAL | 0 refills | Status: DC

## 2016-06-02 LAB — PROTEIN ELECTROPHORESIS, SERUM
A/G Ratio: 1.2 (ref 0.7–1.7)
Albumin ELP: 3.7 g/dL (ref 2.9–4.4)
Alpha-1-Globulin: 0.2 g/dL (ref 0.0–0.4)
Alpha-2-Globulin: 0.5 g/dL (ref 0.4–1.0)
Beta Globulin: 1.1 g/dL (ref 0.7–1.3)
Gamma Globulin: 1.2 g/dL (ref 0.4–1.8)
Globulin, Total: 3 g/dL (ref 2.2–3.9)
Total Protein ELP: 6.7 g/dL (ref 6.0–8.5)

## 2016-06-14 ENCOUNTER — Encounter (HOSPITAL_COMMUNITY): Payer: Self-pay | Admitting: Hematology & Oncology

## 2016-07-25 ENCOUNTER — Encounter (HOSPITAL_COMMUNITY): Payer: Medicare Other

## 2016-08-28 ENCOUNTER — Other Ambulatory Visit (HOSPITAL_COMMUNITY): Payer: Self-pay | Admitting: Oncology

## 2016-08-28 DIAGNOSIS — C9001 Multiple myeloma in remission: Secondary | ICD-10-CM

## 2016-08-28 MED ORDER — METHADONE HCL 10 MG PO TABS: 10.0000 mg | ORAL_TABLET | Freq: Two times a day (BID) | ORAL | 0 refills | Status: DC

## 2016-09-05 ENCOUNTER — Encounter (HOSPITAL_BASED_OUTPATIENT_CLINIC_OR_DEPARTMENT_OTHER): Payer: Medicare Other

## 2016-09-05 ENCOUNTER — Encounter (HOSPITAL_COMMUNITY): Payer: Medicare Other | Attending: Oncology | Admitting: Adult Health

## 2016-09-05 ENCOUNTER — Encounter (HOSPITAL_COMMUNITY): Payer: Self-pay | Admitting: Adult Health

## 2016-09-05 VITALS — BP 164/78 | HR 66 | Temp 98.2°F | Resp 16 | Wt 171.1 lb

## 2016-09-05 DIAGNOSIS — C9001 Multiple myeloma in remission: Secondary | ICD-10-CM | POA: Insufficient documentation

## 2016-09-05 DIAGNOSIS — Z452 Encounter for adjustment and management of vascular access device: Secondary | ICD-10-CM | POA: Diagnosis present

## 2016-09-05 LAB — COMPREHENSIVE METABOLIC PANEL
ALT: 13 U/L — ABNORMAL LOW (ref 17–63)
AST: 21 U/L (ref 15–41)
Albumin: 4.4 g/dL (ref 3.5–5.0)
Alkaline Phosphatase: 45 U/L (ref 38–126)
Anion gap: 12 (ref 5–15)
BUN: 15 mg/dL (ref 6–20)
CO2: 22 mmol/L (ref 22–32)
Calcium: 8.9 mg/dL (ref 8.9–10.3)
Chloride: 107 mmol/L (ref 101–111)
Creatinine, Ser: 0.95 mg/dL (ref 0.61–1.24)
GFR calc Af Amer: >60 mL/min (ref >60)
GFR calc non Af Amer: >60 mL/min (ref >60)
Glucose, Bld: 100 mg/dL — ABNORMAL HIGH (ref 65–99)
Potassium: 3.8 mmol/L (ref 3.5–5.1)
Sodium: 141 mmol/L (ref 135–145)
Total Bilirubin: 1.4 mg/dL — ABNORMAL HIGH (ref 0.3–1.2)
Total Protein: 7.5 g/dL (ref 6.5–8.1)

## 2016-09-05 LAB — CBC WITH DIFFERENTIAL/PLATELET
Basophils Absolute: 0 10*3/uL (ref 0.0–0.1)
Basophils Relative: 0 %
Eosinophils Absolute: 0 10*3/uL (ref 0.0–0.7)
Eosinophils Relative: 1 %
HCT: 40.9 % (ref 39.0–52.0)
Hemoglobin: 13.2 g/dL (ref 13.0–17.0)
Lymphocytes Relative: 42 %
Lymphs Abs: 1.5 10*3/uL (ref 0.7–4.0)
MCH: 24.3 pg — ABNORMAL LOW (ref 26.0–34.0)
MCHC: 32.3 g/dL (ref 30.0–36.0)
MCV: 75.3 fL — ABNORMAL LOW (ref 78.0–100.0)
Monocytes Absolute: 0.2 10*3/uL (ref 0.1–1.0)
Monocytes Relative: 5 %
Neutro Abs: 1.8 10*3/uL (ref 1.7–7.7)
Neutrophils Relative %: 52 %
Platelets: 106 10*3/uL — ABNORMAL LOW (ref 150–400)
RBC: 5.43 MIL/uL (ref 4.22–5.81)
RDW: 16.1 % — ABNORMAL HIGH (ref 11.5–15.5)
WBC: 3.5 10*3/uL — ABNORMAL LOW (ref 4.0–10.5)

## 2016-09-05 LAB — LACTATE DEHYDROGENASE: LDH: 133 U/L (ref 98–192)

## 2016-09-05 MED ORDER — METHADONE HCL 10 MG PO TABS: 10.0000 mg | ORAL_TABLET | Freq: Two times a day (BID) | ORAL | 0 refills | Status: DC

## 2016-09-05 MED ORDER — CHLORHEXIDINE GLUCONATE 0.12 % MT SOLN: 15.0000 mL | Freq: Two times a day (BID) | OROMUCOSAL | 6 refills | Status: DC

## 2016-09-05 MED ORDER — HEPARIN SOD (PORK) LOCK FLUSH 100 UNIT/ML IV SOLN
500.0000 [IU] | Freq: Once | INTRAVENOUS | Status: AC
  Administered 2016-09-05: 500 [IU] via INTRAVENOUS
  Filled 2016-09-05: qty 5

## 2016-09-05 MED ORDER — SODIUM CHLORIDE 0.9% FLUSH
10.0000 mL | INTRAVENOUS | Status: DC | PRN
  Administered 2016-09-05: 10 mL via INTRAVENOUS
  Filled 2016-09-05: qty 10

## 2016-09-05 NOTE — Patient Instructions (Signed)
[facility] at [facility] Discharge Instructions  RECOMMENDATIONS MADE BY THE CONSULTANT AND ANY TEST RESULTS WILL BE SENT TO YOUR REFERRING PHYSICIAN.  Port flush with labs Follow up as scheduled.  Thank you for choosing [facility] at [facility] to provide your oncology and hematology care.  To afford each patient quality time with our provider, please arrive at least 15 minutes before your scheduled appointment time.    If you have a lab appointment with the Cancer Center please come in thru the  Main Entrance and check in at the main information desk  You need to re-schedule your appointment should you arrive 10 or more minutes late.  We strive to give you quality time with our providers, and arriving late affects you and other patients whose appointments are after yours.  Also, if you no show three or more times for appointments you may be dismissed from the clinic at the providers discretion.     Again, thank you for choosing [facility].  Our hope is that these requests will decrease the amount of time that you wait before being seen by our physicians.       _____________________________________________________________  Should you have questions after your visit to [facility], please contact our office at [phone] between the hours of 8:30 a.m. and 4:30 p.m.  Voicemails left after 4:30 p.m. will not be returned until the following business day.  For prescription refill requests, have your pharmacy contact our office.       Resources For Cancer Patients and their Caregivers ? American Cancer Society: Can assist with transportation, wigs, general needs, runs Look Good Feel Better.        1-888-227-6333 ? Cancer Care: Provides financial assistance, online support groups, medication/co-pay assistance.  1-800-813-HOPE (4673) ? [facility] Assists [place] cancer patients and  their families through emotional , educational and financial support.  336-427-4357 ? [place] DSS Where to apply for food stamps, Medicaid and utility assistance. 336-342-1394 ? RCATS: Transportation to medical appointments. 336-347-2287 ? Social Security Administration: May apply for disability if have a Stage IV cancer. 336-342-7796 1-800-772-1213 ? [place] Aging, Disability and Transit Services: Assists with nutrition, care and transit needs. 336-349-2343  Cancer Center Support Programs: @10RELATIVEDAYS@ > Cancer Support Group  2nd Tuesday of the month 1pm-2pm, Journey Room  > Creative Journey  3rd Tuesday of the month 1130am-1pm, Journey Room  > Look Good Feel Better  1st Wednesday of the month 10am-12 noon, Journey Room (Call American Cancer Society to register 1-800-395-5775)   

## 2016-09-05 NOTE — Patient Instructions (Addendum)
[place] at [facility] Discharge Instructions  RECOMMENDATIONS MADE BY THE CONSULTANT AND ANY TEST RESULTS WILL BE SENT TO YOUR REFERRING PHYSICIAN.  You saw Steven Craze, NP, today Follow up in 3 months with labs and port flush. See [name] at checkout for appointments.  Thank you for choosing [place] at [facility] to provide your oncology and hematology care.  To afford each patient quality time with our provider, please arrive at least 15 minutes before your scheduled appointment time.    If you have a lab appointment with the [facility] please come in thru the  Main Entrance and check in at the main information desk  You need to re-schedule your appointment should you arrive 10 or more minutes late.  We strive to give you quality time with our providers, and arriving late affects you and other patients whose appointments are after yours.  Also, if you no show three or more times for appointments you may be dismissed from the clinic at the providers discretion.     Again, thank you for choosing [facility].  Our hope is that these requests will decrease the amount of time that you wait before being seen by our physicians.       _____________________________________________________________  Should you have questions after your visit to [facility], please contact our office at [phone] between the hours of 8:30 a.m. and 4:30 p.m.  Voicemails left after 4:30 p.m. will not be returned until the following business day.  For prescription refill requests, have your pharmacy contact our office.       Resources For Cancer Patients and their Caregivers ? American Cancer Society: Can assist with transportation, wigs, general needs, runs Look Good Feel Better.        858-734-1441 ? Cancer Care: Provides financial assistance, online support groups, medication/co-pay assistance.  1-800-813-HOPE  (902)586-8647) ? [facility] Assists [place] cancer patients and their families through emotional , educational and financial support.  4061136124 ? [place] DSS Where to apply for food stamps, Medicaid and utility assistance. (314)341-2247 ? RCATS: Transportation to medical appointments. 206 208 7418 ? Social Security Administration: May apply for disability if have a Stage IV cancer. 785-477-8711 936 880 1912 ? [employer], Disability and Transit Services: Assists with nutrition, care and transit needs. [place] Support Programs: @10RELATIVEDAYS @ > Cancer Support Group  2nd Tuesday of the month 1pm-2pm, Journey Room  > Creative Journey  3rd Tuesday of the month 1130am-1pm, Journey Room  > Look Good Feel Better  1st Wednesday of the month 10am-12 noon, Journey Room (Call [place] to register [phone])

## 2016-09-05 NOTE — Progress Notes (Signed)
Steven [name] presented for Portacath access and flush. Portacath located right chest wall accessed with  H 20 needle. Good blood return present. Portacath flushed with 59ml NS and 500U/28ml Heparin and needle removed intact. Procedure without incident. Patient tolerated procedure well.  Labs drawn per orders.

## 2016-09-05 NOTE — Progress Notes (Signed)
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 [place] T2-T4 for T3 vertebral body involvement      06/26/2003 - 07/25/2004 Chemotherapy    Doxil, Velcade, and dexamethasone       02/27/2004 Bone Marrow Biopsy    Normal cellular bone marrow showing trilineage hematopoiesis with less than 10% plasma cells.      07/25/2004 Bone Marrow Transplant    Transplant after high-dose chemotherapy with melphalan 200 mg/m, but no maintenance therapy was given, transplant at [facility].      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  Followed by [facility] Department of dentistry.      06/22/2008 Progression    Relapse with The light chains in 24-hour urine found, therapy reinitiated with Revlimid/dexamethasone.      07/31/2008 - 08/10/2013 Chemotherapy    Revlimid/dexamethasone initiated for recurrent disease.       08/27/2010 - 09/10/2010 Radiation Therapy    2500 cGy to right hip for impending right femoral neck fracture      08/04/2013 Imaging    Bone density-normal with T score of -1.0.      01/19/2014 Imaging    MRI pelvis-partially visualized right L4-L5 and L5-S1 facet arthritis with periarticular edema. This can be associated with referred pain to the right hip. Small abnormal marrow signal foci in the proximal right femur are likely representing  treated myeloma. There is no cortical erosion, stress reaction or stress fracture. Similar lesions are present in the right supra-acetabular ileum.      02/08/2014 Imaging    Bone survey-no significant change in widespread myelomatous involvement of skeleton. Multiple pathologic fractures in the thoracic spine at L1 appears stable. No interval pathologic fracture identified.      12/26/2015 Tumor Marker    M-spike NOT OBSERVED; normal IgG, IgM, IgA, & kappa/lambda light chains.        02/26/2016 Treatment Plan Change    Transfer of medical oncology care to [facility]      02/26/2016 Tumor Marker    M-spike NOT OBSERVED; normal IgG, IgM, IgA, & kappa/lambda light chains.       03/22/2016 Imaging    Skeletal survey imaging: IMPRESSION: Stable appearance of widespread myelomatous involvement of the skeleton. No progressive lesions are observed. Stable appearing partial compressions of thoracic vertebral bodies. Stable vertebra plana at T12.      05/29/2016 Tumor Marker    M-spike NOT OBSERVED; normal IgG, IgM, IgA, & kappa/lambda light chains.         HISTORY OF PRESENT ILLNESS:  (From Dr. Donald Pore last note on 05/29/16)     INTERVAL HISTORY:  Steven [name] 56 y.o. male presents for routine follow-up for multiple myeloma, in remission.   He is here today with his wife. Reports feeling very well. He denies any new bone  pain or other concerns. He is voiding well; bowels are moving well.  Denies any headaches, dizziness, or falls.  Appetite and energy levels are good; weights have largely been stable. (our records indicate his weight is down about 3 lbs).  He remains active by walking on the treadmill daily or every other day.    Pain remains well-controlled with BID dosing of methadone alone. The majority of his pain is neuropathic and is in his feet/legs; describes the pain as tingling and throbbing.  He tried several pain medication regimens in the past, but  methadone has given him the best control.  He is requesting a refill of his methadone today, as well as the Peridex mouthwash he uses for h/o osteonecrosis of jaw.  He occasionally has oral ulcers; states "I have pretty bad teeth"; he is followed by [facility].   Otherwise, he feels well and is largely without complaints today.    REVIEW OF SYSTEMS:  Review of Systems  Constitutional: Negative.  Negative for appetite change, chills, fatigue and fever.  HENT:   Positive for mouth sores (occasionally, but not often ). Negative for trouble swallowing.   Eyes: Negative.   Respiratory: Negative.  Negative for cough and shortness of breath.   Cardiovascular: Negative.  Negative for chest pain and palpitations.  Gastrointestinal: Negative.  Negative for abdominal pain, blood in stool, constipation, diarrhea, nausea and vomiting.  Endocrine: Negative.   Genitourinary: Negative.  Negative for dysuria and hematuria.   Skin: Negative.  Negative for rash.  Neurological: Negative for dizziness and light-headedness.       + peripheral neuropathy to feet/legs   Hematological: Negative.   Psychiatric/Behavioral: Negative.      PAST MEDICAL/SURGICAL HISTORY:  Past Medical History:  Diagnosis Date  . Multiple myeloma in remission ([place]) 11/27/2015   History reviewed. No pertinent surgical history.   SOCIAL HISTORY:  Social History   Social History  . Marital status: Married    Spouse name: N/A  . Number of children: N/A  . Years of education: N/A   Occupational History  . Not on file.   Social History Main Topics  . Smoking status: Never Smoker  . Smokeless tobacco: Never Used  . Alcohol use No  . Drug use: No  . Sexual activity: Not on file   Other Topics Concern  . Not on file   Social History Narrative  . No narrative on file    FAMILY HISTORY:  Family History  Problem Relation Age of Onset  . Diabetes Mother   . Cancer Sister     CURRENT MEDICATIONS:  Outpatient  Encounter Prescriptions as of 09/05/2016  Medication Sig Note  . aspirin (GOODSENSE ASPIRIN) 325 MG tablet Take 325 mg by mouth. 12/26/2015: Received from: [facility]  . calcium citrate-vitamin D (CITRACAL+D) 315-200 MG-UNIT tablet Take by mouth. 12/26/2015: Received from: [facility]  . chlorhexidine (PERIDEX) 0.12 % solution Use as directed 15 mLs in the mouth or throat 2 (two) times daily.   . folic acid (FOLVITE) 1 MG tablet Take 1 mg by mouth. 12/26/2015: Received from: [facility]  . lidocaine-prilocaine (EMLA) cream Apply 1 application topically as needed.   . methadone (DOLOPHINE) 10 MG tablet Take 1 tablet (10 mg total) by mouth every 12 (twelve) hours.   . polyethylene glycol (MIRALAX / GLYCOLAX) packet Take by mouth. 12/26/2015: Received from: [facility]  . potassium chloride SA (K-DUR,KLOR-CON) 20 MEQ tablet Take 2 tablets (40 mEq total) by  mouth daily.   . [DISCONTINUED] chlorhexidine (PERIDEX) 0.12 % solution Use as directed 15 mLs in the mouth or throat 2 (two) times daily. Fill in January   . [DISCONTINUED] methadone (DOLOPHINE) 10 MG tablet Take 1 tablet (10 mg total) by mouth every 12 (twelve) hours. To be filled on 08/17/2016   . [DISCONTINUED] methadone (DOLOPHINE) 10 MG tablet Take 1 tablet (10 mg total) by mouth every 12 (twelve) hours.   . [DISCONTINUED] methadone (DOLOPHINE) 10 MG tablet Take 1 tablet (10 mg total) by mouth every 12 (twelve) hours.    Facility-Administered Encounter Medications as of 09/05/2016  Medication  . [COMPLETED] heparin lock flush 100 unit/mL  . sodium chloride flush (NS) 0.9 % injection 10 mL    ALLERGIES:  Not on File   PHYSICAL EXAM:  ECOG Performance status: 1 - Symptomatic, but independent.   Vitals:   09/05/16 1334  BP: (!) 164/78  Pulse: 66  Resp: 16  Temp: 98.2 F (36.8 C)   Filed Weights   09/05/16 1334  Weight: 171 lb 1.6 oz (77.6 kg)    Physical Exam  Constitutional: He is oriented to person, place, and  time and well-developed, well-nourished, and in no distress.  HENT:  Head: Normocephalic.  Mouth/Throat: Oropharynx is clear and moist. No oropharyngeal exudate.  Eyes: Conjunctivae are normal. Pupils are equal, round, and reactive to light. No scleral icterus.  Neck: Normal range of motion. Neck supple.  Cardiovascular: Normal rate, regular rhythm and normal heart sounds.   Pulmonary/Chest: Effort normal and breath sounds normal. No respiratory distress. He has no wheezes.  Abdominal: Soft. Bowel sounds are normal. There is no tenderness. There is no rebound and no guarding.  Musculoskeletal: He exhibits no edema.  Back brace in place.   Lymphadenopathy:    He has no cervical adenopathy.       Right: No supraclavicular adenopathy present.       Left: No supraclavicular adenopathy present.  Neurological: He is alert and oriented to person, place, and time. No cranial nerve deficit. Gait normal.  Skin: Skin is warm and dry. No rash noted.  Psychiatric: Mood, memory, affect and judgment normal.  Nursing note and vitals reviewed.    LABORATORY DATA:  I have reviewed the labs as listed.  CBC    Component Value Date/Time   WBC 3.8 (L) 05/29/2016 1309   RBC 5.26 05/29/2016 1309   HGB 12.6 (L) 05/29/2016 1309   HCT 40.2 05/29/2016 1309   PLT 100 (L) 05/29/2016 1309   MCV 76.4 (L) 05/29/2016 1309   MCH 24.0 (L) 05/29/2016 1309   MCHC 31.3 05/29/2016 1309   RDW 16.9 (H) 05/29/2016 1309   LYMPHSABS 2.2 05/29/2016 1309   MONOABS 0.2 05/29/2016 1309   EOSABS 0.1 05/29/2016 1309   BASOSABS 0.0 05/29/2016 1309   CMP Latest Ref Rng & Units 05/29/2016 02/26/2016 12/26/2015  Glucose 65 - 99 mg/dL 108(H) 134(H) 88  BUN 6 - 20 mg/dL 11 15 14   Creatinine 0.61 - 1.24 mg/dL 0.80 0.81 0.86  Sodium 135 - 145 mmol/L 137 134(L) 138  Potassium 3.5 - 5.1 mmol/L 3.3(L) 3.7 3.7  Chloride 101 - 111 mmol/L 105 106 106  CO2 22 - 32 mmol/L 26 25 26   Calcium 8.9 - 10.3 mg/dL 8.3(L) 8.0(L) 8.2(L)  Total  Protein 6.5 - 8.1 g/dL 7.1 7.5 7.1  Total Bilirubin 0.3 - 1.2 mg/dL 1.4(H) 1.2 1.3(H)  Alkaline Phos 38 - 126 U/L 47 52 54  AST 15 -  41 U/L 20 25 22   ALT 17 - 63 U/L 12(L) 14(L) 16(L)    PENDING LABS:  SPEP/IFE, IgG, IgM, IgA, kappa/lambda light chains    DIAGNOSTIC IMAGING:  Skeletal survey imaging: 04/22/16    PATHOLOGY:  Bone marrow biopsy: 02/27/04     ASSESSMENT & PLAN:   Kappa light chain multiple myeloma, in remission:  -Diagnosed in 05/2003; underwent radiation therapy to T11-L1, T2-T4 and chemotherapy with Doxil/Velcade/Decadron when achieved normal bone marrow. 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  In 06/2008, relapse of light chains in 24-hour urine collected noted; therapy restarted with Revlimid/Decadron. Underwent right femoral radiation for fracture in 08/2010. Completed chemotherapy in 07/2013. Has remained in remission since that time with undetectable M-spike and normal light chains.  -M-spike has been undetectable since we assumed his care last Fall. Light chains remain normal with normal ratios as well.   -SPEP/IFE pending; we will call patient with results. Clinically, he continues to do very well.   -Last skeletal survey was 04/2016 showing stability; will consider annual skeletal imaging for continued surveillance.  -Return to cancer center for follow-up in 3 months.   Bone health:  -Bisphosphonate therapy discontinued d/t osteonecrosis of jaw; he sees [facility] for management. Peridex oral solution refilled today and e-scribed to his pharmacy.  -Continue calcium/vitamin D supplementation and weight-bearing exercises as tolerated.   Chronic pain/peripheral neuropathy:  -Likely secondary to prior chemotherapy.      Dispo:  -Return to cancer center in 3 months for continued surveillance.    All questions were answered to patient's stated  satisfaction. Encouraged patient to call with any new concerns or questions before his next visit to the cancer center and we can certain see him sooner, if needed.      Orders placed this encounter:  Orders Placed This Encounter  Procedures  . CBC with Differential/Platelet  . Comprehensive metabolic panel  . Immunofixation electrophoresis  . IgG, IgA, IgM  . Protein electrophoresis, serum  . Lactate dehydrogenase      Mike Craze, NP [place] [phone]

## 2016-09-06 LAB — IGG, IGA, IGM
IgA: 254 mg/dL (ref 90–386)
IgG (Immunoglobin G), Serum: 1052 mg/dL (ref 700–1600)
IgM, Serum: 92 mg/dL (ref 20–172)

## 2016-09-08 LAB — PROTEIN ELECTROPHORESIS, SERUM
A/G Ratio: 1.3 (ref 0.7–1.7)
Albumin ELP: 3.9 g/dL (ref 2.9–4.4)
Alpha-1-Globulin: 0.2 g/dL (ref 0.0–0.4)
Alpha-2-Globulin: 0.5 g/dL (ref 0.4–1.0)
Beta Globulin: 1.3 g/dL (ref 0.7–1.3)
Gamma Globulin: 1.1 g/dL (ref 0.4–1.8)
Globulin, Total: 3.1 g/dL (ref 2.2–3.9)
Total Protein ELP: 7 g/dL (ref 6.0–8.5)

## 2016-09-09 LAB — IMMUNOFIXATION ELECTROPHORESIS
IgA: 254 mg/dL (ref 90–386)
IgG (Immunoglobin G), Serum: 1034 mg/dL (ref 700–1600)
IgM, Serum: 94 mg/dL (ref 20–172)
Total Protein ELP: 7 g/dL (ref 6.0–8.5)

## 2016-10-31 ENCOUNTER — Encounter (HOSPITAL_COMMUNITY): Payer: Medicare Other

## 2016-11-21 ENCOUNTER — Encounter (HOSPITAL_BASED_OUTPATIENT_CLINIC_OR_DEPARTMENT_OTHER): Payer: Medicare Other

## 2016-11-21 ENCOUNTER — Encounter (HOSPITAL_COMMUNITY): Payer: Self-pay | Admitting: Hematology

## 2016-11-21 ENCOUNTER — Encounter (HOSPITAL_COMMUNITY): Payer: Medicare Other | Attending: Oncology | Admitting: Hematology

## 2016-11-21 VITALS — BP 162/87 | HR 63 | Temp 97.8°F | Resp 18 | Wt 168.8 lb

## 2016-11-21 DIAGNOSIS — G620 Drug-induced polyneuropathy: Secondary | ICD-10-CM | POA: Diagnosis not present

## 2016-11-21 DIAGNOSIS — C9001 Multiple myeloma in remission: Secondary | ICD-10-CM | POA: Diagnosis not present

## 2016-11-21 DIAGNOSIS — Z452 Encounter for adjustment and management of vascular access device: Secondary | ICD-10-CM

## 2016-11-21 DIAGNOSIS — T451X5A Adverse effect of antineoplastic and immunosuppressive drugs, initial encounter: Secondary | ICD-10-CM

## 2016-11-21 LAB — COMPREHENSIVE METABOLIC PANEL
ALT: 12 U/L — ABNORMAL LOW (ref 17–63)
AST: 20 U/L (ref 15–41)
Albumin: 4.3 g/dL (ref 3.5–5.0)
Alkaline Phosphatase: 49 U/L (ref 38–126)
Anion gap: 8 (ref 5–15)
BUN: 13 mg/dL (ref 6–20)
CO2: 25 mmol/L (ref 22–32)
Calcium: 8.6 mg/dL — ABNORMAL LOW (ref 8.9–10.3)
Chloride: 105 mmol/L (ref 101–111)
Creatinine, Ser: 0.8 mg/dL (ref 0.61–1.24)
GFR calc Af Amer: >60 mL/min (ref >60)
GFR calc non Af Amer: >60 mL/min (ref >60)
Glucose, Bld: 105 mg/dL — ABNORMAL HIGH (ref 65–99)
Potassium: 3.9 mmol/L (ref 3.5–5.1)
Sodium: 138 mmol/L (ref 135–145)
Total Bilirubin: 1.3 mg/dL — ABNORMAL HIGH (ref 0.3–1.2)
Total Protein: 7.8 g/dL (ref 6.5–8.1)

## 2016-11-21 LAB — CBC WITH DIFFERENTIAL/PLATELET
Basophils Absolute: 0 10*3/uL (ref 0.0–0.1)
Basophils Relative: 0 %
Eosinophils Absolute: 0 10*3/uL (ref 0.0–0.7)
Eosinophils Relative: 0 %
HCT: 39.4 % (ref 39.0–52.0)
Hemoglobin: 12.6 g/dL — ABNORMAL LOW (ref 13.0–17.0)
Lymphocytes Relative: 22 %
Lymphs Abs: 1.3 10*3/uL (ref 0.7–4.0)
MCH: 24.1 pg — ABNORMAL LOW (ref 26.0–34.0)
MCHC: 32 g/dL (ref 30.0–36.0)
MCV: 75.5 fL — ABNORMAL LOW (ref 78.0–100.0)
Monocytes Absolute: 0.4 10*3/uL (ref 0.1–1.0)
Monocytes Relative: 7 %
Neutro Abs: 3.9 10*3/uL (ref 1.7–7.7)
Neutrophils Relative %: 70 %
Platelets: 145 10*3/uL — ABNORMAL LOW (ref 150–400)
RBC: 5.22 MIL/uL (ref 4.22–5.81)
RDW: 16.4 % — ABNORMAL HIGH (ref 11.5–15.5)
WBC: 5.6 10*3/uL (ref 4.0–10.5)

## 2016-11-21 LAB — LACTATE DEHYDROGENASE: LDH: 175 U/L (ref 98–192)

## 2016-11-21 MED ORDER — HEPARIN SOD (PORK) LOCK FLUSH 100 UNIT/ML IV SOLN
500.0000 [IU] | Freq: Once | INTRAVENOUS | Status: AC
  Administered 2016-11-21: 500 [IU] via INTRAVENOUS

## 2016-11-21 MED ORDER — METHADONE HCL 10 MG PO TABS: 10.0000 mg | ORAL_TABLET | Freq: Two times a day (BID) | ORAL | 0 refills | Status: DC

## 2016-11-21 MED ORDER — SODIUM CHLORIDE 0.9% FLUSH
10.0000 mL | INTRAVENOUS | Status: DC | PRN
  Administered 2016-11-21: 10 mL via INTRAVENOUS
  Filled 2016-11-21: qty 10

## 2016-11-21 MED ORDER — HEPARIN SOD (PORK) LOCK FLUSH 100 UNIT/ML IV SOLN
INTRAVENOUS | Status: AC
  Filled 2016-11-21: qty 5

## 2016-11-21 NOTE — Progress Notes (Signed)
Steven [name] presented for Portacath access and flush.  Portacath located right chest wall accessed with  H 20 needle.  Good blood return present. Portacath flushed with 43ml NS and 500U/74ml Heparin and needle removed intact.  Procedure tolerated well and without incident.  Discharged ambulatory.

## 2016-11-21 NOTE — Patient Instructions (Signed)
[facility] at [facility] Discharge Instructions  RECOMMENDATIONS MADE BY THE CONSULTANT AND ANY TEST RESULTS WILL BE SENT TO YOUR REFERRING PHYSICIAN.  You saw Dr. Kale today.  Thank you for choosing [facility] at [facility] to provide your oncology and hematology care.  To afford each patient quality time with our provider, please arrive at least 15 minutes before your scheduled appointment time.    If you have a lab appointment with the Cancer Center please come in thru the  Main Entrance and check in at the main information desk  You need to re-schedule your appointment should you arrive 10 or more minutes late.  We strive to give you quality time with our providers, and arriving late affects you and other patients whose appointments are after yours.  Also, if you no show three or more times for appointments you may be dismissed from the clinic at the providers discretion.     Again, thank you for choosing [facility].  Our hope is that these requests will decrease the amount of time that you wait before being seen by our physicians.       _____________________________________________________________  Should you have questions after your visit to  Cancer Center, please contact our office at [phone] between the hours of 8:30 a.m. and 4:30 p.m.  Voicemails left after 4:30 p.m. will not be returned until the following business day.  For prescription refill requests, have your pharmacy contact our office.       Resources For Cancer Patients and their Caregivers ? American Cancer Society: Can assist with transportation, wigs, general needs, runs Look Good Feel Better.        1-888-227-6333 ? Cancer Care: Provides financial assistance, online support groups, medication/co-pay assistance.  1-800-813-HOPE (4673) ? [facility] Assists [place] cancer patients and their families through  emotional , educational and financial support.  336-427-4357 ? [place] DSS Where to apply for food stamps, Medicaid and utility assistance. 336-342-1394 ? RCATS: Transportation to medical appointments. 336-347-2287 ? Social Security Administration: May apply for disability if have a Stage IV cancer. 336-342-7796 1-800-772-1213 ? [place] Aging, Disability and Transit Services: Assists with nutrition, care and transit needs. 336-349-2343  Cancer Center Support Programs: @10RELATIVEDAYS@ > Cancer Support Group  2nd Tuesday of the month 1pm-2pm, Journey Room  > Creative Journey  3rd Tuesday of the month 1130am-1pm, Journey Room  > Look Good Feel Better  1st Wednesday of the month 10am-12 noon, Journey Room (Call American Cancer Society to register 1-800-395-5775)    

## 2016-11-22 LAB — IGG, IGA, IGM
IgA: 265 mg/dL (ref 90–386)
IgG (Immunoglobin G), Serum: 1110 mg/dL (ref 700–1600)
IgM, Serum: 131 mg/dL (ref 20–172)

## 2016-11-24 LAB — KAPPA/LAMBDA LIGHT CHAINS
Kappa free light chain: 10.1 mg/L (ref 3.3–19.4)
Kappa, lambda light chain ratio: 1.03 (ref 0.26–1.65)
Lambda free light chains: 9.8 mg/L (ref 5.7–26.3)

## 2016-11-24 LAB — PROTEIN ELECTROPHORESIS, SERUM
A/G Ratio: 1.3 (ref 0.7–1.7)
Albumin ELP: 4.1 g/dL (ref 2.9–4.4)
Alpha-1-Globulin: 0.2 g/dL (ref 0.0–0.4)
Alpha-2-Globulin: 0.5 g/dL (ref 0.4–1.0)
Beta Globulin: 1.2 g/dL (ref 0.7–1.3)
Gamma Globulin: 1.2 g/dL (ref 0.4–1.8)
Globulin, Total: 3.2 g/dL (ref 2.2–3.9)
Total Protein ELP: 7.3 g/dL (ref 6.0–8.5)

## 2016-11-24 LAB — IMMUNOFIXATION ELECTROPHORESIS
IgA: 280 mg/dL (ref 90–386)
IgG (Immunoglobin G), Serum: 1173 mg/dL (ref 700–1600)
IgM, Serum: 137 mg/dL (ref 20–172)
Total Protein ELP: 7.4 g/dL (ref 6.0–8.5)

## 2016-12-10 NOTE — Progress Notes (Signed)
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 [place] T2-T4 for T3 vertebral body involvement      06/26/2003 - 07/25/2004 Chemotherapy    Doxil, Velcade, and dexamethasone       02/27/2004 Bone Marrow Biopsy    Normal cellular bone marrow showing trilineage hematopoiesis with less than 10% plasma cells.      07/25/2004 Bone Marrow Transplant    Transplant after high-dose chemotherapy with melphalan 200 mg/m, but no maintenance therapy was given, transplant at [facility]      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  Followed by [facility] Department of dentistry.      06/22/2008 Progression    Relapse with The light chains in 24-hour urine found, therapy reinitiated with Revlimid/dexamethasone.      07/31/2008 - 08/10/2013 Chemotherapy    Revlimid/dexamethasone initiated for recurrent disease.       08/27/2010 - 09/10/2010 Radiation Therapy    2500 cGy to right hip for impending right femoral neck fracture      08/04/2013 Imaging    Bone density-normal with T score of -1.0.      01/19/2014 Imaging    MRI pelvis-partially visualized right L4-L5 and L5-S1 facet arthritis with periarticular edema. This can be associated with referred pain to the right hip. Small abnormal marrow signal foci in the proximal right femur are likely representing treated myeloma. There is no  cortical erosion, stress reaction or stress fracture. Similar lesions are present in the right supra-acetabular ileum.      02/08/2014 Imaging    Bone survey-no significant change in widespread myelomatous involvement of skeleton. Multiple pathologic fractures in the thoracic spine at L1 appears stable. No interval pathologic fracture identified.      12/26/2015 Tumor Marker    M-spike NOT OBSERVED; normal IgG, IgM, IgA, & kappa/lambda light chains.        02/26/2016 Treatment Plan Change    Transfer of medical oncology care to [facility]      02/26/2016 Tumor Marker    M-spike NOT OBSERVED; normal IgG, IgM, IgA, & kappa/lambda light chains.       03/22/2016 Imaging    Skeletal survey imaging: IMPRESSION: Stable appearance of widespread myelomatous involvement of the skeleton. No progressive lesions are observed. Stable appearing partial compressions of thoracic vertebral bodies. Stable vertebra plana at T12.      05/29/2016 Tumor Marker    M-spike NOT OBSERVED; normal IgG, IgM, IgA, & kappa/lambda light chains.        INTERVAL HISTORY:  Patient is here for his 3 month follow-up for multiple myeloma which has been in remission. he notes some chronic low back pain but no other Specific focal new bone pains. No new fatigue. Overall feels well.  REVIEW OF SYSTEMS:    10 Point review of systems of done and is negative except as noted  above.  . Past Medical History:  Diagnosis Date  . Multiple myeloma in remission ([place]) 11/27/2015    .History reviewed. No pertinent surgical history.  . Social History  Substance Use Topics  . Smoking status: Never Smoker  . Smokeless tobacco: Never Used  . Alcohol use No    ALLERGIES:  has No Known Allergies.  MEDICATIONS:  Current Outpatient Prescriptions  Medication Sig Dispense Refill  . aspirin (GOODSENSE ASPIRIN) 325 MG tablet Take 325 mg by mouth.    . calcium citrate-vitamin D (CITRACAL+D) 315-200 MG-UNIT tablet  Take by mouth.    . chlorhexidine (PERIDEX) 0.12 % solution Use as directed 15 mLs in the mouth or throat 2 (two) times daily. 010 mL 6  . folic acid (FOLVITE) 1 MG tablet Take 1 mg by mouth.    . lidocaine-prilocaine (EMLA) cream Apply 1 application topically as needed.    Derrill Memo ON 02/28/2017] methadone (DOLOPHINE) 10 MG tablet Take 1 tablet (10 mg total) by mouth every 12 (twelve) hours. 60 tablet 0  . polyethylene glycol (MIRALAX / GLYCOLAX) packet Take by mouth.    . potassium chloride SA (K-DUR,KLOR-CON) 20 MEQ tablet Take 2 tablets (40 mEq total) by mouth daily. 28 tablet 0   No current facility-administered medications for this visit.     PHYSICAL EXAMINATION: ECOG PERFORMANCE STATUS: 1 - Symptomatic but completely ambulatory  . Vitals:   11/21/16 1457  BP: (!) 162/87  Pulse: 63  Resp: 18  Temp: 97.8 F (36.6 C)    Filed Weights   11/21/16 1457  Weight: 168 lb 12.8 oz (76.6 kg)   .Body mass index is 27.25 kg/m.  GENERAL:alert, in no acute distress and comfortable SKIN: no acute rashes, no significant lesions EYES: conjunctiva are pink and non-injected, sclera anicteric OROPHARYNX: MMM, no exudates, no oropharyngeal erythema or ulceration NECK: supple, no JVD LYMPH:  no palpable lymphadenopathy in the cervical, axillary or inguinal regions LUNGS: clear to auscultation b/l with normal respiratory effort HEART: regular rate & rhythm ABDOMEN:  normoactive bowel sounds , non tender, not distended. Extremity: no pedal edema PSYCH: alert & oriented x 3 with fluent speech NEURO: no focal motor/sensory deficits  LABORATORY DATA:   I have reviewed the data as listed  . CBC Latest Ref Rng & Units 11/21/2016 09/05/2016 05/29/2016  WBC 4.0 - 10.5 K/uL 5.6 3.5(L) 3.8(L)  Hemoglobin 13.0 - 17.0 g/dL 12.6(L) 13.2 12.6(L)  Hematocrit 39.0 - 52.0 % 39.4 40.9 40.2  Platelets 150 - 400 K/uL 145(L) 106(L) 100(L)    . CMP Latest Ref Rng & Units 11/21/2016 09/05/2016 05/29/2016    Glucose 65 - 99 mg/dL 105(H) 100(H) 108(H)  BUN 6 - 20 mg/dL _0 Creatinine 0.61 - 1.24 mg/dL 0.80 0.95 0.80  Sodium 135 - 145 mmol/L 138 141 137  Potassium 3.5 - 5.1 mmol/L 3.9 3.8 3.3(L)  Chloride 101 - 111 mmol/L 105 107 105  CO2 22 - 32 mmol/L _1 Calcium 8.9 - 10.3 mg/dL 8.6(L) 8.9 8.3(L)  Total Protein 6.5 - 8.1 g/dL 7.8 7.5 7.1  Total Bilirubin 0.3 - 1.2 mg/dL 1.3(H) 1.4(H) 1.4(H)  Alkaline Phos 38 - 126 U/L 49 45 47  AST 15 - 41 U/L _2 ALT 17 - 63 U/L 12(L) 13(L) 12(L)   Component     Latest Ref Rng & Units 11/21/2016 11/21/2016 11/21/2016         3:30 PM  3:30 PM  3:31 PM  Total Protein ELP     6.0 - 8.5 g/dL 7.3 7.4   Albumin ELP     2.9 - 4.4 g/dL 4.1    Alpha-1-Globulin     0.0 - 0.4 g/dL 0.2    Alpha-2-Globulin     0.4 - 1.0 g/dL 0.5    Beta Globulin     0.7 - 1.3 g/dL 1.2    Gamma Globulin     0.4 - 1.8 g/dL 1.2    M-SPIKE, %     Not Observed g/dL Not Observed    SPE Interp.      Comment    Comment      Comment    Globulin, Total     2.2 - 3.9 g/dL 3.2    A/G Ratio     0.7 - 1.7 1.3    IgG (Immunoglobin G), Serum     700 - 1,600 mg/dL 1,110 1,173   IgA     90 - 386 mg/dL 265 280   IgM, Serum     20 - 172 mg/dL 131 137   Immunofixation Result, Serum       Comment   Kappa free light chain     3.3 - 19.4 mg/L   10.1  Lamda free light chains     5.7 - 26.3 mg/L   9.8  Kappa, lamda light chain ratio     0.26 - 1.65   1.03  LDH     98 - 192 U/L 175      RADIOGRAPHIC STUDIES: I have personally reviewed the radiological images as listed and agreed with the findings in the report. No results found.  ASSESSMENT & PLAN:   56 year old male with  1) Kappa light chain multiple myeloma, in remission:  -Diagnosed in 05/2003; underwent radiation therapy to T11-L1, T2-T4 and chemotherapy with Doxil/Velcade/Decadron when achieved normal bone marrow. He went on to have stem cell transplant at [facility] in 07/2004. 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  In 06/2008, relapse of light chains in 24-hour urine collected noted; therapy restarted with Revlimid/Decadron. Underwent right femoral radiation for fracture in 08/2010. Completed chemotherapy in 07/2013. Has remained in remission since that time with undetectable M-spike and normal light chains.  Last skeletal survey was 04/2016 showing stability; will consider annual skeletal imaging for continued surveillance.  Plan -Labs from today show no M spike and normal serum free light chains and the ratio. No anemia no renal insufficiency and hypercalcemia. -No clinical indication or lab indication for multiple myeloma progression at this time. -RTC in 3 month with MD/APP with labs  I spent 20 minutes counseling the patient face to face. The total time spent in the appointment was 25 minutes and more than 50% was on counseling and direct patient cares.    Sullivan Lone MD [place] AAHIVMS [facility] Hematology/Oncology Physician [employer]  (Office):       [phone] (Work cell):  [phone] (Fax):           [phone]

## 2016-12-26 ENCOUNTER — Encounter (HOSPITAL_COMMUNITY): Payer: Medicare Other

## 2017-01-16 ENCOUNTER — Encounter (HOSPITAL_COMMUNITY): Payer: Medicare Other

## 2017-02-09 ENCOUNTER — Telehealth (HOSPITAL_COMMUNITY): Payer: Self-pay | Admitting: *Deleted

## 2017-02-09 ENCOUNTER — Other Ambulatory Visit (HOSPITAL_COMMUNITY): Payer: Self-pay | Admitting: Adult Health

## 2017-02-09 DIAGNOSIS — C9001 Multiple myeloma in remission: Secondary | ICD-10-CM

## 2017-02-09 MED ORDER — POTASSIUM CHLORIDE CRYS ER 20 MEQ PO TBCR: 20.0000 meq | EXTENDED_RELEASE_TABLET | Freq: Every day | ORAL | 0 refills | Status: DC

## 2017-02-09 NOTE — Telephone Encounter (Signed)
Potassium refilled.   Please call patient and let him know that I changed the prescription to 1 tab (20 mEq) potassium once daily, as it has been quite some time since we filled his potassium Rx.  We can re-address at his follow-up visit in 02/2017; please make sure he has labs (CMET) ordered & scheduled the same day as his follow-up visit please. -gwd

## 2017-02-10 ENCOUNTER — Other Ambulatory Visit (HOSPITAL_COMMUNITY): Payer: Self-pay

## 2017-02-10 DIAGNOSIS — C9001 Multiple myeloma in remission: Secondary | ICD-10-CM

## 2017-02-10 NOTE — Telephone Encounter (Signed)
Notified patient of potassium refill and change in dosage and lab appointment for same day prior to follow up with verbalized understanding.

## 2017-02-27 ENCOUNTER — Other Ambulatory Visit (HOSPITAL_COMMUNITY): Payer: Medicare Other

## 2017-02-27 ENCOUNTER — Encounter (HOSPITAL_COMMUNITY): Payer: Medicare Other | Admitting: Oncology

## 2017-03-02 ENCOUNTER — Encounter (HOSPITAL_COMMUNITY): Payer: Medicare Other | Attending: Oncology

## 2017-03-02 ENCOUNTER — Encounter (HOSPITAL_COMMUNITY): Payer: Self-pay | Admitting: Oncology

## 2017-03-02 ENCOUNTER — Encounter (HOSPITAL_BASED_OUTPATIENT_CLINIC_OR_DEPARTMENT_OTHER): Payer: Medicare Other | Admitting: Oncology

## 2017-03-02 DIAGNOSIS — Z95828 Presence of other vascular implants and grafts: Secondary | ICD-10-CM

## 2017-03-02 DIAGNOSIS — C9001 Multiple myeloma in remission: Secondary | ICD-10-CM

## 2017-03-02 LAB — COMPREHENSIVE METABOLIC PANEL
ALT: 12 U/L — ABNORMAL LOW (ref 17–63)
AST: 23 U/L (ref 15–41)
Albumin: 4.4 g/dL (ref 3.5–5.0)
Alkaline Phosphatase: 50 U/L (ref 38–126)
Anion gap: 11 (ref 5–15)
BUN: 15 mg/dL (ref 6–20)
CO2: 24 mmol/L (ref 22–32)
Calcium: 8.8 mg/dL — ABNORMAL LOW (ref 8.9–10.3)
Chloride: 104 mmol/L (ref 101–111)
Creatinine, Ser: 0.94 mg/dL (ref 0.61–1.24)
GFR calc Af Amer: >60 mL/min (ref >60)
GFR calc non Af Amer: >60 mL/min (ref >60)
Glucose, Bld: 117 mg/dL — ABNORMAL HIGH (ref 65–99)
Potassium: 3.7 mmol/L (ref 3.5–5.1)
Sodium: 139 mmol/L (ref 135–145)
Total Bilirubin: 0.9 mg/dL (ref 0.3–1.2)
Total Protein: 7.7 g/dL (ref 6.5–8.1)

## 2017-03-02 LAB — CBC WITH DIFFERENTIAL/PLATELET
Basophils Absolute: 0 10*3/uL (ref 0.0–0.1)
Basophils Relative: 0 %
Eosinophils Absolute: 0 10*3/uL (ref 0.0–0.7)
Eosinophils Relative: 0 %
HCT: 40.5 % (ref 39.0–52.0)
Hemoglobin: 12.9 g/dL — ABNORMAL LOW (ref 13.0–17.0)
Lymphocytes Relative: 22 %
Lymphs Abs: 1.1 10*3/uL (ref 0.7–4.0)
MCH: 24.1 pg — ABNORMAL LOW (ref 26.0–34.0)
MCHC: 31.9 g/dL (ref 30.0–36.0)
MCV: 75.7 fL — ABNORMAL LOW (ref 78.0–100.0)
Monocytes Absolute: 0.2 10*3/uL (ref 0.1–1.0)
Monocytes Relative: 5 %
Neutro Abs: 3.7 10*3/uL (ref 1.7–7.7)
Neutrophils Relative %: 73 %
Platelets: 103 10*3/uL — ABNORMAL LOW (ref 150–400)
RBC: 5.35 MIL/uL (ref 4.22–5.81)
RDW: 16.6 % — ABNORMAL HIGH (ref 11.5–15.5)
WBC: 5.1 10*3/uL (ref 4.0–10.5)

## 2017-03-02 LAB — LACTATE DEHYDROGENASE: LDH: 142 U/L (ref 98–192)

## 2017-03-02 MED ORDER — METHADONE HCL 10 MG PO TABS: 10.0000 mg | ORAL_TABLET | Freq: Two times a day (BID) | ORAL | 0 refills | Status: DC

## 2017-03-02 MED ORDER — POTASSIUM CHLORIDE CRYS ER 20 MEQ PO TBCR: 20.0000 meq | EXTENDED_RELEASE_TABLET | Freq: Every day | ORAL | 0 refills | Status: DC

## 2017-03-02 MED ORDER — SODIUM CHLORIDE 0.9% FLUSH
10.0000 mL | INTRAVENOUS | Status: DC | PRN
  Administered 2017-03-02: 10 mL via INTRAVENOUS
  Filled 2017-03-02: qty 10

## 2017-03-02 MED ORDER — HEPARIN SOD (PORK) LOCK FLUSH 100 UNIT/ML IV SOLN
500.0000 [IU] | Freq: Once | INTRAVENOUS | Status: AC
  Administered 2017-03-02: 500 [IU] via INTRAVENOUS

## 2017-03-02 NOTE — Progress Notes (Signed)
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 [place] T2-T4 for T3 vertebral body involvement      06/26/2003 - 07/25/2004 Chemotherapy    Doxil, Velcade, and dexamethasone       02/27/2004 Bone Marrow Biopsy    Normal cellular bone marrow showing trilineage hematopoiesis with less than 10% plasma cells.      07/25/2004 Bone Marrow Transplant    Transplant after high-dose chemotherapy with melphalan 200 mg/m, but no maintenance therapy was given, transplant at [facility].      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  Followed by [facility] Department of dentistry.      06/22/2008 Progression    Relapse with The light chains in 24-hour urine found, therapy reinitiated with Revlimid/dexamethasone.      07/31/2008 - 08/10/2013 Chemotherapy    Revlimid/dexamethasone initiated for recurrent disease.       08/27/2010 - 09/10/2010 Radiation Therapy    2500 cGy to right hip for impending right femoral neck fracture      08/04/2013 Imaging    Bone density-normal with T score of -1.0.      01/19/2014 Imaging    MRI pelvis-partially visualized right L4-L5 and L5-S1 facet arthritis with periarticular edema. This can be associated with referred pain to the right hip. Small abnormal marrow signal foci in the proximal right femur are likely representing treated myeloma. There is no  cortical erosion, stress reaction or stress fracture. Similar lesions are present in the right supra-acetabular ileum.      02/08/2014 Imaging    Bone survey-no significant change in widespread myelomatous involvement of skeleton. Multiple pathologic fractures in the thoracic spine at L1 appears stable. No interval pathologic fracture identified.      12/26/2015 Tumor Marker    M-spike NOT OBSERVED; normal IgG, IgM, IgA, & kappa/lambda light chains.        02/26/2016 Treatment Plan Change    Transfer of medical oncology care to [facility]      02/26/2016 Tumor Marker    M-spike NOT OBSERVED; normal IgG, IgM, IgA, & kappa/lambda light chains.       03/22/2016 Imaging    Skeletal survey imaging: IMPRESSION: Stable appearance of widespread myelomatous involvement of the skeleton. No progressive lesions are observed. Stable appearing partial compressions of thoracic vertebral bodies. Stable vertebra plana at T12.      05/29/2016 Tumor Marker    M-spike NOT OBSERVED; normal IgG, IgM, IgA, & kappa/lambda light chains.        INTERVAL HISTORY:  Patient is here for his 3 month follow-up for multiple myeloma which has been in remission. he notes some chronic low back pain but no other Specific focal new bone pains. No new fatigue. Overall feels well. March 02, 2017 Patient is h for further follow-up and treatment consideration.  No chills fever and abdominal pain.  No bony pains.  No nausea no vomiting or diarrhea appetite has been fairly good All the lab data is pending  REVIEW OF SYSTEMS:    10 Point review of systems of done and is negative except as noted above.  . Past Medical History:  Diagnosis Date  . Multiple myeloma in remission ([place]) 11/27/2015    .History reviewed. No pertinent surgical history.  . Social History  Substance Use Topics  . Smoking status: Never Smoker  . Smokeless tobacco: Never Used  . Alcohol use No    ALLERGIES:  has No Known  Allergies.  MEDICATIONS:  Current Outpatient Prescriptions  Medication Sig Dispense Refill  . aspirin (GOODSENSE ASPIRIN) 325 MG tablet Take 325 mg by mouth.    . calcium citrate-vitamin D (CITRACAL+D) 315-200 MG-UNIT tablet Take by mouth.    . chlorhexidine (PERIDEX) 0.12 % solution Use as directed 15 mLs in the mouth or throat 2 (two) times daily. 017 mL 6  . folic acid (FOLVITE) 1 MG tablet Take 1 mg by mouth.    . lidocaine-prilocaine (EMLA) cream Apply 1 application topically as needed.    . methadone (DOLOPHINE) 10 MG tablet Take 1 tablet (10 mg total) by mouth every 12 (twelve) hours. 60 tablet 0  . polyethylene glycol (MIRALAX / GLYCOLAX) packet Take by mouth.    . potassium chloride SA (K-DUR,KLOR-CON) 20 MEQ tablet Take 1 tablet (20 mEq total) by mouth daily. 30 tablet 0   No current facility-administered medications for this visit.    Facility-Administered Medications Ordered in Other Visits  Medication Dose Route Frequency Provider Last Rate Last Dose  . sodium chloride flush (NS) 0.9 % injection 10 mL  10 mL Intravenous PRN Twana First, MD   10 mL at 03/02/17 1400    PHYSICAL EXAMINATION: ECOG PERFORMANCE STATUS: 1 - Symptomatic but completely ambulatory  . Vitals:   03/02/17 1348  BP: (!) 184/76  Pulse: 67  Resp: 16  SpO2: 100%    Filed Weights   03/02/17 1348  Weight: 170 lb 6.4 oz (77.3 kg)   .Body mass index is 27.5 kg/m.  GENERAL:alert, in no acute distress and comfortable SKIN: no acute rashes, no significant lesions EYES: conjunctiva are pink and non-injected, sclera anicteric OROPHARYNX: MMM, no exudates, no oropharyngeal erythema or ulceration NECK: supple, no JVD LYMPH:  no palpable lymphadenopathy in the cervical, axillary or inguinal regions LUNGS: clear to auscultation b/l with normal respiratory effort HEART: regular rate & rhythm ABDOMEN:  normoactive bowel sounds , non tender, not distended. Extremity: no pedal edema PSYCH: alert &  oriented x 3 with fluent speech NEURO: no focal motor/sensory deficits  LABORATORY DATA:   I have reviewed the data as listed  . CBC Latest Ref Rng & Units 11/21/2016 09/05/2016 05/29/2016  WBC 4.0 - 10.5 K/uL 5.6 3.5(L) 3.8(L)  Hemoglobin 13.0 - 17.0 g/dL 12.6(L) 13.2 12.6(L)  Hematocrit 39.0 - 52.0 % 39.4 40.9 40.2  Platelets 150 - 400 K/uL 145(L) 106(L) 100(L)    . CMP Latest Ref Rng & Units 11/21/2016 09/05/2016 05/29/2016  Glucose 65 - 99 mg/dL 105(H) 100(H) 108(H)  BUN 6 - 20 mg/dL 13 15 11   Creatinine 0.61 - 1.24 mg/dL 0.80 0.95 0.80  Sodium 135 - 145 mmol/L 138 141 137  Potassium 3.5 - 5.1 mmol/L 3.9 3.8 3.3(L)  Chloride 101 - 111 mmol/L 105 107 105  CO2 22 - 32 mmol/L 25 22 26   Calcium 8.9 - 10.3 mg/dL 8.6(L) 8.9 8.3(L)  Total  Protein 6.5 - 8.1 g/dL 7.8 7.5 7.1  Total Bilirubin 0.3 - 1.2 mg/dL 1.3(H) 1.4(H) 1.4(H)  Alkaline Phos 38 - 126 U/L 49 45 47  AST 15 - 41 U/L 20 21 20   ALT 17 - 63 U/L 12(L) 13(L) 12(L)   Component     Latest Ref Rng & Units 11/21/2016 11/21/2016 11/21/2016         3:30 PM  3:30 PM  3:31 PM  Total Protein ELP     6.0 - 8.5 g/dL 7.3 7.4   Albumin ELP     2.9 - 4.4 g/dL 4.1    Alpha-1-Globulin     0.0 - 0.4 g/dL 0.2    Alpha-2-Globulin     0.4 - 1.0 g/dL 0.5    Beta Globulin     0.7 - 1.3 g/dL 1.2    Gamma Globulin     0.4 - 1.8 g/dL 1.2    M-SPIKE, %     Not Observed g/dL Not Observed    SPE Interp.      Comment    Comment      Comment    Globulin, Total     2.2 - 3.9 g/dL 3.2    A/G Ratio     0.7 - 1.7 1.3    IgG (Immunoglobin G), Serum     700 - 1,600 mg/dL 1,110 1,173   IgA     90 - 386 mg/dL 265 280   IgM, Serum     20 - 172 mg/dL 131 137   Immunofixation Result, Serum       Comment   Kappa free light chain     3.3 - 19.4 mg/L   10.1  Lamda free light chains     5.7 - 26.3 mg/L   9.8  Kappa, lamda light chain ratio     0.26 - 1.65   1.03  LDH     98 - 192 U/L 175      RADIOGRAPHIC STUDIES: I have personally reviewed  the radiological images as listed and agreed with the findings in the report. No results found.  ASSESSMENT & PLAN:   56 year old male with  1) Kappa light chain multiple myeloma, in remission:  -Diagnosed in 05/2003; underwent radiation therapy to T11-L1, T2-T4 and chemotherapy with Doxil/Velcade/Decadron when achieved normal bone marrow. He went on to have stem cell transplant at [facility] in 07/2004. 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  In 06/2008, relapse of light chains in 24-hour urine collected noted; therapy restarted with Revlimid/Decadron. Underwent right femoral radiation for fracture in 08/2010. Completed chemotherapy in 07/2013. Has remained in remission since that time with undetectable M-spike and normal light chains.  2.we will reevaluate patient in 3 months unless lab data in September of 2018 shows any abnormality  At the time of return appointment patient will get CBC, comprehensive metabolic panel, SIEP, light chain.  24 hours urine for evaluation and skeletal survey -No clinical indication or lab indication for multiple myeloma progression at this time. -RTC in 3 month with MD/APP with labs

## 2017-03-02 NOTE — Progress Notes (Signed)
Steven [name] presented for Portacath access and flush. Portacath located right chest wall accessed with  H 20 needle.  Good blood return present. Portacath flushed with 21ml NS and 500U/52ml Heparin and needle removed intact.  Procedure tolerated well and without incident.   Patient tolerated treatment without incidence. Patient discharged ambulatory and in stable condition from clinic. Patient to follow up as scheduled.

## 2017-03-02 NOTE — Patient Instructions (Signed)
[facility] at [facility] Discharge Instructions  RECOMMENDATIONS MADE BY THE CONSULTANT AND ANY TEST RESULTS WILL BE SENT TO YOUR REFERRING PHYSICIAN.  You had your port flushed today Continue to get it flushed every 6-8 weeks Follow up as scheduled.  Thank you for choosing [facility] at [facility] to provide your oncology and hematology care.  To afford each patient quality time with our provider, please arrive at least 15 minutes before your scheduled appointment time.    If you have a lab appointment with the Cancer Center please come in thru the  Main Entrance and check in at the main information desk  You need to re-schedule your appointment should you arrive 10 or more minutes late.  We strive to give you quality time with our providers, and arriving late affects you and other patients whose appointments are after yours.  Also, if you no show three or more times for appointments you may be dismissed from the clinic at the providers discretion.     Again, thank you for choosing [facility].  Our hope is that these requests will decrease the amount of time that you wait before being seen by our physicians.       _____________________________________________________________  Should you have questions after your visit to [facility], please contact our office at [phone] between the hours of 8:30 a.m. and 4:30 p.m.  Voicemails left after 4:30 p.m. will not be returned until the following business day.  For prescription refill requests, have your pharmacy contact our office.       Resources For Cancer Patients and their Caregivers ? American Cancer Society: Can assist with transportation, wigs, general needs, runs Look Good Feel Better.        1-888-227-6333 ? Cancer Care: Provides financial assistance, online support groups, medication/co-pay assistance.  1-800-813-HOPE (4673) ? [facility] Assists [place] cancer patients and their families through emotional , educational and financial support.  336-427-4357 ? [place] DSS Where to apply for food stamps, Medicaid and utility assistance. 336-342-1394 ? [facility]: Transportation to medical appointments. 336-347-2287 ? Social Security Administration: May apply for disability if have a Stage IV cancer. 336-342-7796 1-800-772-1213 ? [place] Aging, Disability and Transit Services: Assists with nutrition, care and transit needs. 336-349-2343  Cancer Center Support Programs: @10RELATIVEDAYS@ > Cancer Support Group  2nd Tuesday of the month 1pm-2pm, Journey Room  > Creative Journey  3rd Tuesday of the month 1130am-1pm, Journey Room  > Look Good Feel Better  1st Wednesday of the month 10am-12 noon, Journey Room (Call American Cancer Society to register 1-800-395-5775)    

## 2017-03-03 LAB — PROTEIN ELECTROPHORESIS, SERUM
A/G Ratio: 1.3 (ref 0.7–1.7)
Albumin ELP: 4.2 g/dL (ref 2.9–4.4)
Alpha-1-Globulin: 0.2 g/dL (ref 0.0–0.4)
Alpha-2-Globulin: 0.5 g/dL (ref 0.4–1.0)
Beta Globulin: 1.3 g/dL (ref 0.7–1.3)
Gamma Globulin: 1.3 g/dL (ref 0.4–1.8)
Globulin, Total: 3.2 g/dL (ref 2.2–3.9)
Total Protein ELP: 7.4 g/dL (ref 6.0–8.5)

## 2017-03-03 LAB — IMMUNOFIXATION ELECTROPHORESIS
IgA: 296 mg/dL (ref 90–386)
IgG (Immunoglobin G), Serum: 1233 mg/dL (ref 700–1600)
IgM (Immunoglobulin M), Srm: 98 mg/dL (ref 20–172)
Total Protein ELP: 7.4 g/dL (ref 6.0–8.5)

## 2017-03-03 LAB — IGG, IGA, IGM
IgA: 290 mg/dL (ref 90–386)
IgG (Immunoglobin G), Serum: 1208 mg/dL (ref 700–1600)
IgM (Immunoglobulin M), Srm: 96 mg/dL (ref 20–172)

## 2017-03-03 LAB — KAPPA/LAMBDA LIGHT CHAINS
Kappa free light chain: 9.4 mg/L (ref 3.3–19.4)
Kappa, lambda light chain ratio: 0.92 (ref 0.26–1.65)
Lambda free light chains: 10.2 mg/L (ref 5.7–26.3)

## 2017-04-02 ENCOUNTER — Other Ambulatory Visit: Payer: Self-pay | Admitting: *Deleted

## 2017-04-02 DIAGNOSIS — C9001 Multiple myeloma in remission: Secondary | ICD-10-CM

## 2017-04-03 NOTE — Telephone Encounter (Signed)
Advised pharmacy to send to [facility]

## 2017-04-07 ENCOUNTER — Other Ambulatory Visit (HOSPITAL_COMMUNITY): Payer: Self-pay | Admitting: Oncology

## 2017-04-07 DIAGNOSIS — C9001 Multiple myeloma in remission: Secondary | ICD-10-CM

## 2017-04-28 ENCOUNTER — Encounter (HOSPITAL_COMMUNITY): Payer: Self-pay

## 2017-04-28 ENCOUNTER — Encounter (HOSPITAL_COMMUNITY): Payer: Medicare Other | Attending: Oncology

## 2017-04-28 VITALS — BP 156/73 | HR 61 | Temp 97.4°F | Resp 16

## 2017-04-28 DIAGNOSIS — Z452 Encounter for adjustment and management of vascular access device: Secondary | ICD-10-CM | POA: Diagnosis not present

## 2017-04-28 DIAGNOSIS — C9001 Multiple myeloma in remission: Secondary | ICD-10-CM | POA: Insufficient documentation

## 2017-04-28 MED ORDER — SODIUM CHLORIDE 0.9% FLUSH
10.0000 mL | Freq: Once | INTRAVENOUS | Status: AC
  Administered 2017-04-28: 10 mL via INTRAVENOUS

## 2017-04-28 MED ORDER — METHADONE HCL 10 MG PO TABS: 10.0000 mg | ORAL_TABLET | Freq: Two times a day (BID) | ORAL | 0 refills | Status: DC

## 2017-04-28 MED ORDER — HEPARIN SOD (PORK) LOCK FLUSH 100 UNIT/ML IV SOLN
500.0000 [IU] | Freq: Once | INTRAVENOUS | Status: AC
  Administered 2017-04-28: 500 [IU] via INTRAVENOUS

## 2017-04-28 NOTE — Patient Instructions (Signed)
[facility] at [facility]  Discharge Instructions:  Your port was flushed today. _______________________________________________________________  Thank you for choosing [facility] at [facility] to provide your oncology and hematology care.  To afford each patient quality time with our providers, please arrive at least 15 minutes before your scheduled appointment.  You need to re-schedule your appointment if you arrive 10 or more minutes late.  We strive to give you quality time with our providers, and arriving late affects you and other patients whose appointments are after yours.  Also, if you no show three or more times for appointments you may be dismissed from the clinic.  Again, thank you for choosing [facility] at [facility]. Our hope is that these requests will allow you access to exceptional care and in a timely manner. _______________________________________________________________  If you have questions after your visit, please contact our office at [phone] between the hours of 8:30 a.m. and 5:00 p.m. Voicemails left after 4:30 p.m. will not be returned until the following business day. _______________________________________________________________  For prescription refill requests, have your pharmacy contact our office. _______________________________________________________________  Recommendations made by the consultant and any test results will be sent to your referring physician. _______________________________________________________________ 

## 2017-04-28 NOTE — Progress Notes (Signed)
Patients port flushed per protocol.  Site clean and dry with no bruising or swelling noted at site.  Band aid applied.  VSS with discharge and left ambulatory with no s/s of distress noted.  24hour urine jug given and reviewed instructions for collection and to bring back to the lab with understanding verbalized.

## 2017-05-01 ENCOUNTER — Other Ambulatory Visit (HOSPITAL_COMMUNITY): Payer: Self-pay | Admitting: *Deleted

## 2017-05-01 DIAGNOSIS — C9001 Multiple myeloma in remission: Secondary | ICD-10-CM

## 2017-05-01 MED ORDER — POTASSIUM CHLORIDE CRYS ER 20 MEQ PO TBCR: 20.0000 meq | EXTENDED_RELEASE_TABLET | Freq: Every day | ORAL | 0 refills | Status: DC

## 2017-05-04 ENCOUNTER — Other Ambulatory Visit (HOSPITAL_COMMUNITY): Payer: Self-pay

## 2017-05-04 DIAGNOSIS — C9001 Multiple myeloma in remission: Secondary | ICD-10-CM

## 2017-05-04 LAB — PROTEIN, URINE, 24 HOUR
Collection Interval-UPROT: 24 hours
Protein, Urine: <6 mg/dL
Urine Total Volume-UPROT: 1655 mL

## 2017-05-27 ENCOUNTER — Other Ambulatory Visit (HOSPITAL_COMMUNITY): Payer: Self-pay | Admitting: Oncology

## 2017-05-27 DIAGNOSIS — C9001 Multiple myeloma in remission: Secondary | ICD-10-CM

## 2017-05-27 MED ORDER — METHADONE HCL 10 MG PO TABS: 10.0000 mg | ORAL_TABLET | Freq: Two times a day (BID) | ORAL | 0 refills | Status: DC

## 2017-06-01 ENCOUNTER — Ambulatory Visit (HOSPITAL_COMMUNITY): Payer: Medicare Other

## 2017-06-01 ENCOUNTER — Other Ambulatory Visit (HOSPITAL_COMMUNITY): Payer: Medicare Other

## 2017-06-05 ENCOUNTER — Other Ambulatory Visit (HOSPITAL_COMMUNITY): Payer: Self-pay | Admitting: Oncology

## 2017-06-05 DIAGNOSIS — C9001 Multiple myeloma in remission: Secondary | ICD-10-CM

## 2017-06-15 ENCOUNTER — Ambulatory Visit (HOSPITAL_COMMUNITY): Payer: Medicare Other | Admitting: Hematology and Oncology

## 2017-06-15 ENCOUNTER — Other Ambulatory Visit (HOSPITAL_COMMUNITY): Payer: Medicare Other

## 2017-06-19 ENCOUNTER — Encounter (HOSPITAL_COMMUNITY): Payer: Self-pay | Admitting: Oncology

## 2017-06-19 ENCOUNTER — Inpatient Hospital Stay (HOSPITAL_COMMUNITY): Payer: Medicare Other

## 2017-06-19 ENCOUNTER — Other Ambulatory Visit: Payer: Self-pay

## 2017-06-19 ENCOUNTER — Ambulatory Visit (HOSPITAL_COMMUNITY)
Admission: RE | Admit: 2017-06-19 | Discharge: 2017-06-19 | Disposition: A | Discharge status: Home / Self Care | Payer: Medicare Other | Source: Ambulatory Visit | Attending: Oncology | Admitting: Oncology

## 2017-06-19 ENCOUNTER — Inpatient Hospital Stay (HOSPITAL_COMMUNITY): Payer: Medicare Other | Attending: Hematology and Oncology | Admitting: Oncology

## 2017-06-19 VITALS — BP 147/98 | HR 78 | Temp 98.3°F | Resp 18 | Ht 66.0 in | Wt 166.0 lb

## 2017-06-19 DIAGNOSIS — C9001 Multiple myeloma in remission: Secondary | ICD-10-CM

## 2017-06-19 DIAGNOSIS — Z95828 Presence of other vascular implants and grafts: Secondary | ICD-10-CM

## 2017-06-19 DIAGNOSIS — G8929 Other chronic pain: Secondary | ICD-10-CM

## 2017-06-19 DIAGNOSIS — I10 Essential (primary) hypertension: Secondary | ICD-10-CM

## 2017-06-19 DIAGNOSIS — M546 Pain in thoracic spine: Secondary | ICD-10-CM | POA: Diagnosis not present

## 2017-06-19 LAB — LACTATE DEHYDROGENASE: LDH: 120 U/L (ref 98–192)

## 2017-06-19 LAB — CBC WITH DIFFERENTIAL/PLATELET
Basophils Absolute: 0 10*3/uL (ref 0.0–0.1)
Basophils Relative: 0 %
Eosinophils Absolute: 0.2 10*3/uL (ref 0.0–0.7)
Eosinophils Relative: 4 %
HCT: 39.5 % (ref 39.0–52.0)
Hemoglobin: 12.6 g/dL — ABNORMAL LOW (ref 13.0–17.0)
Lymphocytes Relative: 54 %
Lymphs Abs: 2.3 10*3/uL (ref 0.7–4.0)
MCH: 23.7 pg — ABNORMAL LOW (ref 26.0–34.0)
MCHC: 31.9 g/dL (ref 30.0–36.0)
MCV: 74.2 fL — ABNORMAL LOW (ref 78.0–100.0)
Monocytes Absolute: 0.5 10*3/uL (ref 0.1–1.0)
Monocytes Relative: 11 %
Neutro Abs: 1.4 10*3/uL — ABNORMAL LOW (ref 1.7–7.7)
Neutrophils Relative %: 31 %
Platelets: 126 10*3/uL — ABNORMAL LOW (ref 150–400)
RBC: 5.32 MIL/uL (ref 4.22–5.81)
RDW: 16.1 % — ABNORMAL HIGH (ref 11.5–15.5)
WBC: 4.3 10*3/uL (ref 4.0–10.5)

## 2017-06-19 LAB — COMPREHENSIVE METABOLIC PANEL
ALT: 13 U/L — ABNORMAL LOW (ref 17–63)
AST: 19 U/L (ref 15–41)
Albumin: 4.1 g/dL (ref 3.5–5.0)
Alkaline Phosphatase: 53 U/L (ref 38–126)
Anion gap: 9 (ref 5–15)
BUN: 12 mg/dL (ref 6–20)
CO2: 24 mmol/L (ref 22–32)
Calcium: 8.6 mg/dL — ABNORMAL LOW (ref 8.9–10.3)
Chloride: 109 mmol/L (ref 101–111)
Creatinine, Ser: 0.97 mg/dL (ref 0.61–1.24)
GFR calc Af Amer: >60 mL/min (ref >60)
GFR calc non Af Amer: >60 mL/min (ref >60)
Glucose, Bld: 101 mg/dL — ABNORMAL HIGH (ref 65–99)
Potassium: 3.5 mmol/L (ref 3.5–5.1)
Sodium: 142 mmol/L (ref 135–145)
Total Bilirubin: 1.4 mg/dL — ABNORMAL HIGH (ref 0.3–1.2)
Total Protein: 7.3 g/dL (ref 6.5–8.1)

## 2017-06-19 MED ORDER — SODIUM CHLORIDE 0.9% FLUSH
10.0000 mL | INTRAVENOUS | Status: AC | PRN
  Administered 2017-06-19: 10 mL via INTRAVENOUS
  Filled 2017-06-19: qty 10

## 2017-06-19 MED ORDER — POTASSIUM CHLORIDE CRYS ER 20 MEQ PO TBCR: 20.0000 meq | EXTENDED_RELEASE_TABLET | Freq: Every day | ORAL | 5 refills | Status: DC

## 2017-06-19 MED ORDER — METHADONE HCL 10 MG PO TABS: 10.0000 mg | ORAL_TABLET | Freq: Two times a day (BID) | ORAL | 0 refills | Status: DC

## 2017-06-19 MED ORDER — HEPARIN SOD (PORK) LOCK FLUSH 100 UNIT/ML IV SOLN
500.0000 [IU] | Freq: Once | INTRAVENOUS | Status: AC
  Administered 2017-06-19: 500 [IU] via INTRAVENOUS

## 2017-06-19 NOTE — Patient Instructions (Signed)
[place] at [facility] Discharge Instructions  RECOMMENDATIONS MADE BY THE CONSULTANT AND ANY TEST RESULTS WILL BE SENT TO YOUR REFERRING PHYSICIAN.  We will update laboratory work today. You are due for a skeletal survey and this can be completed today or within the next 3 months at your convenience. Methadone refills have been provided for 3 months.  With Laboratory work in 3 months. Return in 3 months for follow-up.  Thank you for choosing [place] at [facility] to provide your oncology and hematology care.  To afford each patient quality time with our provider, please arrive at least 15 minutes before your scheduled appointment time.    If you have a lab appointment with the [facility] please come in thru the  Main Entrance and check in at the main information desk  You need to re-schedule your appointment should you arrive 10 or more minutes late.  We strive to give you quality time with our providers, and arriving late affects you and other patients whose appointments are after yours.  Also, if you no show three or more times for appointments you may be dismissed from the clinic at the providers discretion.     Again, thank you for choosing [facility].  Our hope is that these requests will decrease the amount of time that you wait before being seen by our physicians.       _____________________________________________________________  Should you have questions after your visit to [facility], please contact our office at [phone] between the hours of 8:30 a.m. and 4:30 p.m.  Voicemails left after 4:30 p.m. will not be returned until the following business day.  For prescription refill requests, have your pharmacy contact our office.       Resources For Cancer Patients and their Caregivers ? American Cancer Society: Can assist with transportation, wigs, general needs, runs Look Good Feel Better.         ([phone] ? Cancer Care: Provides financial assistance, online support groups, medication/co-pay assistance.  1-800-813-HOPE 806 177 3961) ? [facility] Assists [place] cancer patients and their families through emotional , educational and financial support.  551-692-9158 ? [place] DSS Where to apply for food stamps, Medicaid and utility assistance. 416-381-8048 ? [facility]: Transportation to medical appointments. 385 096 6558 ? Social Security Administration: May apply for disability if have a Stage IV cancer. 619-169-3090 8542333986 ? [employer], Disability and Transit Services: Assists with nutrition, care and transit needs. [place] Support Programs: @10RELATIVEDAYS @ > Cancer Support Group  2nd Tuesday of the month 1pm-2pm, Journey Room  > Creative Journey  3rd Tuesday of the month 1130am-1pm, Journey Room  > Look Good Feel Better  1st Wednesday of the month 10am-12 noon, Journey Room (Call [place] to register [phone])

## 2017-06-19 NOTE — Assessment & Plan Note (Addendum)
Multiple myeloma, kappa light chain restricted, presenting with back pain as a result of a T2 vertebral body fracture.  His multiple myeloma currently in remission.  Not on therapy at this time.  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  Labs today: CBC diff, CMET, LDH, SPEP+IFE, light chain assay, B2M.  Labs not yet drawn.  Previous laboratory work from 02/2017 demonstrates a mild anemia at 12.7 g/dL, normal white blood cell count, and chronic (stable) thrombocytopenia.  M spike was not observed and light chains were within normal limits.  Prescription for methadone times 3 months is printed for the patient today.  Port flushes every 6-8 weeks.  Laboratory work in 3 weeks: CBC diff, CMET, LDR, SPEP+IFE, light chain assay and B2M.  He is overdue for annual skeletal survey.  Orders placed for skeletal survey and this can be completed today if he wishes or within the next 3 months prior to his next follow-up appointment at which time results will be reviewed with medical oncology and recommendations will follow accordingly.  Return in 12 weeks for follow-up.

## 2017-06-19 NOTE — Progress Notes (Signed)
Steven Blitz, MD [address] [place] [address]  Multiple myeloma in remission [facility]) - Plan: Lactate dehydrogenase, Protein electrophoresis, serum, IgG, IgA, IgM, Immunofixation electrophoresis, Comprehensive metabolic panel, CBC with Differential/Platelet, CBC with Differential, Comprehensive metabolic panel, Lactate dehydrogenase, Kappa/lambda light chains, Beta 2 microglobuline, serum, IgG, IgA, IgM, Immunofixation electrophoresis, Protein electrophoresis, serum, DG Bone Survey Met, methadone (DOLOPHINE) 10 MG tablet, potassium chloride SA (K-DUR,KLOR-CON) 20 MEQ tablet, DISCONTINUED: methadone (DOLOPHINE) 10 MG tablet, DISCONTINUED: methadone (DOLOPHINE) 10 MG tablet  Chronic midline thoracic back pain  Port-A-Cath in place  Essential hypertension  CURRENT THERAPY: Active surveillance, not on active therapy.  INTERVAL HISTORY: Steven [name] 57 y.o. male returns for followup of multiple myeloma, kappa-light chain restricted   HPI Elements   Location:  Bone marrow, blood, vertebral body  Quality:  Multiple myeloma, kappa restricted  Severity:  In remission  Duration:   Context:  Not on current therapy.  Timing:  Presented with a T2 vertebral body fracture  Modifying Factors:   Associated Signs & Symptoms:    Hematologically, the patient is doing well.  He denies any new complaints.  Specifically, he denies any new pain including bone pain.  He continues to have chronic back pain and is wearing a back brace for support.  He continues on chronic opiate with methadone for which he has been on for many years.  He denies any B symptoms.  He reports that he would like to get back to exercising but due to his back pain, this is prohibitive.  I recommended exercise at the [facility] in the pool to help with stress on joints and bones.  Review of Systems  Constitutional: Negative.  Negative for chills, fever and weight loss.  HENT: Negative.   Eyes: Negative.   Respiratory:  Negative.  Negative for cough.   Cardiovascular: Negative.  Negative for chest pain.  Gastrointestinal: Negative.  Negative for blood in stool, constipation, diarrhea, melena, nausea and vomiting.  Genitourinary: Negative.   Musculoskeletal: Positive for back pain.  Skin: Negative.   Neurological: Negative.  Negative for weakness.  Endo/Heme/Allergies: Negative.   Psychiatric/Behavioral: Negative.     Past Medical History:  Diagnosis Date  . Multiple myeloma in remission ([place]) 11/27/2015    History reviewed. No pertinent surgical history.  Family History  Problem Relation Age of Onset  . Diabetes Mother   . Cancer Sister     Social History   Socioeconomic History  . Marital status: Married    Spouse name: None  . Number of children: None  . Years of education: None  . Highest education level: None  Social Needs  . Financial resource strain: None  . Food insecurity - worry: None  . Food insecurity - inability: None  . Transportation needs - medical: None  . Transportation needs - non-medical: None  Occupational History  . None  Tobacco Use  . Smoking status: Never Smoker  . Smokeless tobacco: Never Used  Substance and Sexual Activity  . Alcohol use: No  . Drug use: No  . Sexual activity: None  Other Topics Concern  . None  Social History Narrative  . None     PHYSICAL EXAMINATION  ECOG PERFORMANCE STATUS: 1 - Symptomatic but completely ambulatory  Vitals:   06/19/17 1052  BP: (!) 147/98  Pulse: 78  Resp: 18  Temp: 98.3 F (36.8 C)  SpO2: 100%    GENERAL:alert, no distress, well nourished, well developed, comfortable, cooperative and  smiling SKIN: skin color, texture, turgor are normal, no rashes or significant lesions HEAD: Normocephalic, No masses, lesions, tenderness or abnormalities EYES: normal, EOMI, Conjunctiva are pink and non-injected EARS: External ears normal OROPHARYNX:lips, buccal mucosa, and tongue normal and mucous membranes are  moist  NECK: supple, no adenopathy, trachea midline LYMPH:  no palpable lymphadenopathy BREAST:not examined LUNGS: clear to auscultation and percussion HEART: regular rate & rhythm, no murmurs, no gallops, S1 normal and S2 normal ABDOMEN:abdomen soft, non-tender, normal bowel sounds and no masses or organomegaly BACK: Back symmetric, no curvature., No CVA tenderness EXTREMITIES:less then 2 second capillary refill, no joint deformities, effusion, or inflammation, no edema, no skin discoloration, no clubbing, no cyanosis  NEURO: alert & oriented x 3 with fluent speech, no focal motor/sensory deficits, gait normal   LABORATORY DATA: CBC    Component Value Date/Time   WBC 4.3 06/19/2017 1022   RBC 5.32 06/19/2017 1022   HGB 12.6 (L) 06/19/2017 1022   HCT 39.5 06/19/2017 1022   PLT 126 (L) 06/19/2017 1022   MCV 74.2 (L) 06/19/2017 1022   MCH 23.7 (L) 06/19/2017 1022   MCHC 31.9 06/19/2017 1022   RDW 16.1 (H) 06/19/2017 1022   LYMPHSABS 2.3 06/19/2017 1022   MONOABS 0.5 06/19/2017 1022   EOSABS 0.2 06/19/2017 1022   BASOSABS 0.0 06/19/2017 1022      Chemistry      Component Value Date/Time   NA 142 06/19/2017 1022   K 3.5 06/19/2017 1022   CL 109 06/19/2017 1022   CO2 24 06/19/2017 1022   BUN 12 06/19/2017 1022   CREATININE 0.97 06/19/2017 1022      Component Value Date/Time   CALCIUM 8.6 (L) 06/19/2017 1022   ALKPHOS 53 06/19/2017 1022   AST 19 06/19/2017 1022   ALT 13 (L) 06/19/2017 1022   BILITOT 1.4 (H) 06/19/2017 1022     Lab Results  Component Value Date   PROT 7.3 06/19/2017   ALBUMINELP 4.2 03/02/2017   A1GS 0.2 03/02/2017   A2GS 0.5 03/02/2017   BETS 1.3 03/02/2017   GAMS 1.3 03/02/2017   MSPIKE Not Observed 03/02/2017   SPEI Comment 03/02/2017   SPECOM Comment 03/02/2017   IGGSERUM 1,233 03/02/2017   IGGSERUM 1,208 03/02/2017   IGA 296 03/02/2017   IGA 290 03/02/2017   IGMSERUM 98 03/02/2017   IGMSERUM 96 03/02/2017   KPAFRELGTCHN 9.4 03/02/2017     LAMBDASER 10.2 03/02/2017   KAPLAMBRATIO 0.92 03/02/2017     PENDING LABS:   RADIOGRAPHIC STUDIES:  Dg Bone Survey Met  Result Date: 06/19/2017 CLINICAL DATA:  Multiple myeloma in remission. Evaluate for osseous/lytic lesions. EXAM: METASTATIC BONE SURVEY COMPARISON:  Bone survey dated April 22, 2016. FINDINGS: Skull: Numerous subcentimeter lucent lesions within the skull are grossly unchanged. Chest: Right chest wall port catheter in unchanged position with the tip in the distal SVC. The cardiomediastinal silhouette is normal in size. Normal pulmonary vascularity. No focal consolidation, pleural effusion, or pneumothorax. Stable expansile lesion within the left anterior sixth rib. Spine: Stable severe compression deformity of T12. Prior L1 kyphoplasty. No new vertebral body height loss. Upper extremities: Stable subcentimeter lucent lesions in the bilateral humeral shafts and proximal right radius. Pelvis and lower extremities: Diffusely heterogeneous appearance of the pelvis, similar to prior study. Multiple subcentimeter lucencies within the proximal to mid bilateral femoral shafts are unchanged. IMPRESSION: Stable sequelae of multiple myeloma within the axial and appendicular skeleton. No progressive osseous lesions or acute osseous abnormality. Electronically Signed  By: Titus Dubin M.D.   On: 06/19/2017 15:24     PATHOLOGY:    ASSESSMENT AND PLAN:  Multiple myeloma in remission ([place]) Multiple myeloma, kappa light chain restricted, presenting with back pain as a result of a T2 vertebral body fracture.  His multiple myeloma currently in remission.  Not on therapy at this time.  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  Labs today: CBC diff, CMET, LDH, SPEP+IFE, light chain assay, B2M.  Labs not yet drawn.  Previous laboratory work from 02/2017 demonstrates a mild anemia at  12.7 g/dL, normal white blood cell count, and chronic (stable) thrombocytopenia.  M spike was not observed and light chains were within normal limits.  Prescription for methadone times 3 months is printed for the patient today.  Port flushes every 6-8 weeks.  Laboratory work in 3 weeks: CBC diff, CMET, LDR, SPEP+IFE, light chain assay and B2M.  He is overdue for annual skeletal survey.  Orders placed for skeletal survey and this can be completed today if he wishes or within the next 3 months prior to his next follow-up appointment at which time results will be reviewed with medical oncology and recommendations will follow accordingly.  Return in 12 weeks for follow-up.  2. Chronic midline thoracic back pain Secondary to previous pathologic fracture.  On chronic opiate, methadone, with improved pain management.  No new pain or progression of pain.  3. Port-A-Cath in place Port flush today.  We will continue with port flushes every 12 weeks.  4. Hypertension We will defer management to primary care provider.  Hypertension is noted today at 147/98.   Final Result of Complexity      Choose decision making level with 2 or 3 checks OR choose the decision making level on Section B       A Number of diagnoses or treatment options  _0   </= 1 Minimal  _1   2 Limited  _2   3 Multiple  _3   >/= 4 Extensive  B Amount and complexity of data  _4   </= 1 Minimal or low  _5   2 Limited  _6   3  Moderate  _7   >/= 4 Extensive  C Highest risk  _8   Minimal  _9   Low  _10   Moderate  _11   High   Type of decision making  _12   Straight-forward  _13   Low Complexity  _14   Moderate- Complexity  _15   High- Complexity     ORDERS PLACED FOR THIS ENCOUNTER: Orders Placed This Encounter  Procedures  . DG Bone Survey Met  . CBC with Differential  . Comprehensive metabolic panel  . Lactate dehydrogenase  . Kappa/lambda light chains  . Beta 2 microglobuline, serum  . IgG, IgA, IgM  .  Immunofixation electrophoresis  . Protein electrophoresis, serum    MEDICATIONS PRESCRIBED THIS ENCOUNTER: Meds ordered this encounter  Medications  . DISCONTD: methadone (DOLOPHINE) 10 MG tablet    Sig: Take 1 tablet (10 mg total) by mouth every 12 (twelve) hours.    Dispense:  60 tablet    Refill:  0    To be filled on 06/25/2017 or later    Order Specific Question:   Supervising Provider    Answer:   Brunetta Genera [[id number]]  . DISCONTD: methadone (DOLOPHINE) 10 MG tablet    Sig: Take 1 tablet (10 mg total) by mouth every 12 (twelve) hours.  Dispense:  60 tablet    Refill:  0    To be filled on 07/23/2017 or later    Order Specific Question:   Supervising Provider    Answer:   Brunetta Genera [[id number]]  . methadone (DOLOPHINE) 10 MG tablet    Sig: Take 1 tablet (10 mg total) by mouth every 12 (twelve) hours.    Dispense:  60 tablet    Refill:  0    To be filled on 08/20/2017 or later    Order Specific Question:   Supervising Provider    Answer:   Brunetta Genera [[id number]]  . potassium chloride SA (K-DUR,KLOR-CON) 20 MEQ tablet    Sig: Take 1 tablet (20 mEq total) by mouth daily.    Dispense:  30 tablet    Refill:  5    Order Specific Question:   Supervising Provider    Answer:   Brunetta Genera [[id number]]    THERAPY PLAN:  Skeletal survey in the near future for annual surveillance.  We will continue with blood work every 3 months.  We will continue with long-acting pain medication for chronic back pain secondary to history of pathologic fracture of T2 vertebral body.  All questions were answered. The patient knows to call the clinic with any problems, questions or concerns. We can certainly see the patient much sooner if necessary.  Patient and plan discussed with Dr. Twana First and she is in agreement with the aforementioned.   This note is electronically signed by: Doy Mince 06/19/2017 6:38 PM

## 2017-06-20 LAB — IGG, IGA, IGM
IgA: 323 mg/dL (ref 90–386)
IgG (Immunoglobin G), Serum: 1184 mg/dL (ref 700–1600)
IgM (Immunoglobulin M), Srm: 94 mg/dL (ref 20–172)

## 2017-06-22 LAB — PROTEIN ELECTROPHORESIS, SERUM
A/G Ratio: 1.1 (ref 0.7–1.7)
Albumin ELP: 3.7 g/dL (ref 2.9–4.4)
Alpha-1-Globulin: 0.2 g/dL (ref 0.0–0.4)
Alpha-2-Globulin: 0.6 g/dL (ref 0.4–1.0)
Beta Globulin: 1.3 g/dL (ref 0.7–1.3)
Gamma Globulin: 1.4 g/dL (ref 0.4–1.8)
Globulin, Total: 3.5 g/dL (ref 2.2–3.9)
Total Protein ELP: 7.2 g/dL (ref 6.0–8.5)

## 2017-06-22 LAB — IMMUNOFIXATION ELECTROPHORESIS
IgA: 299 mg/dL (ref 90–386)
IgG (Immunoglobin G), Serum: 1133 mg/dL (ref 700–1600)
IgM (Immunoglobulin M), Srm: 101 mg/dL (ref 20–172)
Total Protein ELP: 7.2 g/dL (ref 6.0–8.5)

## 2017-06-29 ENCOUNTER — Other Ambulatory Visit (HOSPITAL_COMMUNITY): Payer: Self-pay | Admitting: Hematology & Oncology

## 2017-06-29 DIAGNOSIS — C9001 Multiple myeloma in remission: Secondary | ICD-10-CM

## 2017-08-10 ENCOUNTER — Encounter (HOSPITAL_COMMUNITY): Payer: Medicare Other

## 2017-08-28 ENCOUNTER — Other Ambulatory Visit (HOSPITAL_COMMUNITY): Payer: Self-pay | Admitting: Adult Health

## 2017-08-28 DIAGNOSIS — C9001 Multiple myeloma in remission: Secondary | ICD-10-CM

## 2017-09-17 ENCOUNTER — Ambulatory Visit (HOSPITAL_COMMUNITY): Payer: Medicare Other | Admitting: Internal Medicine

## 2017-09-17 ENCOUNTER — Other Ambulatory Visit (HOSPITAL_COMMUNITY): Payer: Medicare Other

## 2017-09-22 ENCOUNTER — Encounter (HOSPITAL_COMMUNITY): Payer: Self-pay | Admitting: Internal Medicine

## 2017-09-22 ENCOUNTER — Inpatient Hospital Stay (HOSPITAL_COMMUNITY): Payer: Medicare Other | Attending: Hematology and Oncology | Admitting: Internal Medicine

## 2017-09-22 ENCOUNTER — Inpatient Hospital Stay (HOSPITAL_COMMUNITY): Payer: Medicare Other

## 2017-09-22 ENCOUNTER — Other Ambulatory Visit: Payer: Self-pay

## 2017-09-22 DIAGNOSIS — Z95828 Presence of other vascular implants and grafts: Secondary | ICD-10-CM

## 2017-09-22 DIAGNOSIS — C9001 Multiple myeloma in remission: Secondary | ICD-10-CM | POA: Insufficient documentation

## 2017-09-22 DIAGNOSIS — Z452 Encounter for adjustment and management of vascular access device: Secondary | ICD-10-CM | POA: Diagnosis not present

## 2017-09-22 DIAGNOSIS — I10 Essential (primary) hypertension: Secondary | ICD-10-CM | POA: Insufficient documentation

## 2017-09-22 DIAGNOSIS — G8929 Other chronic pain: Secondary | ICD-10-CM | POA: Insufficient documentation

## 2017-09-22 LAB — COMPREHENSIVE METABOLIC PANEL
ALT: 14 U/L — ABNORMAL LOW (ref 17–63)
AST: 22 U/L (ref 15–41)
Albumin: 4.2 g/dL (ref 3.5–5.0)
Alkaline Phosphatase: 49 U/L (ref 38–126)
Anion gap: 12 (ref 5–15)
BUN: 14 mg/dL (ref 6–20)
CO2: 25 mmol/L (ref 22–32)
Calcium: 8.4 mg/dL — ABNORMAL LOW (ref 8.9–10.3)
Chloride: 101 mmol/L (ref 101–111)
Creatinine, Ser: 0.93 mg/dL (ref 0.61–1.24)
GFR calc Af Amer: >60 mL/min (ref >60)
GFR calc non Af Amer: >60 mL/min (ref >60)
Glucose, Bld: 119 mg/dL — ABNORMAL HIGH (ref 65–99)
Potassium: 3.2 mmol/L — ABNORMAL LOW (ref 3.5–5.1)
Sodium: 138 mmol/L (ref 135–145)
Total Bilirubin: 1.3 mg/dL — ABNORMAL HIGH (ref 0.3–1.2)
Total Protein: 7.5 g/dL (ref 6.5–8.1)

## 2017-09-22 LAB — CBC WITH DIFFERENTIAL/PLATELET
Basophils Absolute: 0 10*3/uL (ref 0.0–0.1)
Basophils Relative: 0 %
Eosinophils Absolute: 0.1 10*3/uL (ref 0.0–0.7)
Eosinophils Relative: 1 %
HCT: 38.2 % — ABNORMAL LOW (ref 39.0–52.0)
Hemoglobin: 12 g/dL — ABNORMAL LOW (ref 13.0–17.0)
Lymphocytes Relative: 41 %
Lymphs Abs: 1.7 10*3/uL (ref 0.7–4.0)
MCH: 23.7 pg — ABNORMAL LOW (ref 26.0–34.0)
MCHC: 31.4 g/dL (ref 30.0–36.0)
MCV: 75.3 fL — ABNORMAL LOW (ref 78.0–100.0)
Monocytes Absolute: 0.4 10*3/uL (ref 0.1–1.0)
Monocytes Relative: 9 %
Neutro Abs: 2 10*3/uL (ref 1.7–7.7)
Neutrophils Relative %: 49 %
Platelets: 130 10*3/uL — ABNORMAL LOW (ref 150–400)
RBC: 5.07 MIL/uL (ref 4.22–5.81)
RDW: 17 % — ABNORMAL HIGH (ref 11.5–15.5)
WBC: 4.2 10*3/uL (ref 4.0–10.5)

## 2017-09-22 LAB — LACTATE DEHYDROGENASE: LDH: 138 U/L (ref 98–192)

## 2017-09-22 MED ORDER — SODIUM CHLORIDE 0.9% FLUSH
10.0000 mL | INTRAVENOUS | Status: DC | PRN
  Administered 2017-09-22: 10 mL via INTRAVENOUS
  Filled 2017-09-22: qty 10

## 2017-09-22 MED ORDER — HEPARIN SOD (PORK) LOCK FLUSH 100 UNIT/ML IV SOLN
500.0000 [IU] | Freq: Once | INTRAVENOUS | Status: AC
  Administered 2017-09-22: 500 [IU] via INTRAVENOUS

## 2017-09-22 MED ORDER — METHADONE HCL 10 MG PO TABS: 10.0000 mg | ORAL_TABLET | Freq: Two times a day (BID) | ORAL | 0 refills | Status: DC

## 2017-09-22 NOTE — Progress Notes (Signed)
Steven [name] presented for Portacath access and flush.  Portacath located right chest wall accessed with  H 20 needle.  Good blood return present. Portacath flushed with 83ml NS and 500U/71ml Heparin and needle removed intact.  Procedure tolerated well and without incident.  Discharged ambulatory in c/o spouse.

## 2017-09-23 LAB — PROTEIN ELECTROPHORESIS, SERUM
A/G Ratio: 1.2 (ref 0.7–1.7)
Albumin ELP: 3.9 g/dL (ref 2.9–4.4)
Alpha-1-Globulin: 0.2 g/dL (ref 0.0–0.4)
Alpha-2-Globulin: 0.5 g/dL (ref 0.4–1.0)
Beta Globulin: 1.2 g/dL (ref 0.7–1.3)
Gamma Globulin: 1.2 g/dL (ref 0.4–1.8)
Globulin, Total: 3.2 g/dL (ref 2.2–3.9)
Total Protein ELP: 7.1 g/dL (ref 6.0–8.5)

## 2017-09-23 LAB — KAPPA/LAMBDA LIGHT CHAINS
Kappa free light chain: 11.7 mg/L (ref 3.3–19.4)
Kappa, lambda light chain ratio: 1.03 (ref 0.26–1.65)
Lambda free light chains: 11.4 mg/L (ref 5.7–26.3)

## 2017-09-23 LAB — BETA 2 MICROGLOBULIN, SERUM: Beta-2 Microglobulin: 1.2 mg/L (ref 0.6–2.4)

## 2017-09-23 LAB — IGG, IGA, IGM
IgA: 314 mg/dL (ref 90–386)
IgG (Immunoglobin G), Serum: 1191 mg/dL (ref 700–1600)
IgM (Immunoglobulin M), Srm: 87 mg/dL (ref 20–172)

## 2017-09-24 LAB — IMMUNOFIXATION ELECTROPHORESIS
IgA: 314 mg/dL (ref 90–386)
IgG (Immunoglobin G), Serum: 1206 mg/dL (ref 700–1600)
IgM (Immunoglobulin M), Srm: 85 mg/dL (ref 20–172)
Total Protein ELP: 7 g/dL (ref 6.0–8.5)

## 2017-09-27 ENCOUNTER — Other Ambulatory Visit (HOSPITAL_COMMUNITY): Payer: Self-pay | Admitting: Adult Health

## 2017-09-27 DIAGNOSIS — C9001 Multiple myeloma in remission: Secondary | ICD-10-CM

## 2017-10-16 NOTE — Progress Notes (Signed)
Diagnosis Multiple myeloma in remission ([place]) - Plan: methadone (DOLOPHINE) 10 MG tablet, CBC with Differential/Platelet, Comprehensive metabolic panel, Lactate dehydrogenase, Protein electrophoresis, serum, Kappa/lambda light chains, Beta 2 microglobuline, serum, IgG, IgA, IgM  Staging Cancer Staging No matching staging information was found for the patient.  Assessment and Plan:  1.  Multiple myeloma in remission ([place]).  Pt was followed by Dr. Whitney Muse and PA Sheldon Silvan.  He has Multiple myeloma, kappa light chain restricted, presenting with back pain as a result of a T2 vertebral body fracture.  His multiple myeloma currently in remission.  Not on therapy at this time.  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  When questioned, he is no longer being seen by [facility] and is not on maintenance therapy.    Labs done 09/22/2017 white count 4.2 hemoglobin 12 platelets 130,000.  SPEP negative, creatinine normal 0.93.  Kappa/lambda ratio is 1.03.  Skeletal survey done 06/19/2017 showed IMPRESSION: Stable sequelae of multiple myeloma within the axial and appendicular skeleton. No progressive osseous lesions or acute osseous abnormality.  Patient is requesting prescription for methadone.  He had previously been given Rx for methadone by PA Kefalas in January 2019 for 3 months.  He is given a Rx for 30-day supply.  Patient will establish with Dr. Delton Coombes.  2. Chronic midline thoracic back pain.  Secondary to previous pathologic fracture.  On chronic opiate, methadone.  Patient was given Rx for methadone by PA Kefalas in January 2019 for 3 months.  He is given a 30-day supply today.  Patient will establish with Dr. Delton Coombes.  He will determine if he will continue ongoing methadone or refer him to pain management clinic.  3.   Hypertension.  BP 158/85.  Follow-up with PCP.    Current Status: Pt is  requesting Rx for methadone.  When questioned he is not being seen at [facility] and is not on any maintenance therapy.       Multiple myeloma in remission ([place])   06/05/2003 Initial Diagnosis    Myeloma, presenting with excruciating back pain and T12 vertebral body fracture, kappa light chain multiple myeloma diagnosed with 2030 mg light chains per 24 hours and urine      06/09/2003 - 06/22/2003 Radiation Therapy    19 Gray to T11-L1 or T12 vertebral body fracture. Jane T2-T4 for T3 vertebral body involvement      06/26/2003 - 07/25/2004 Chemotherapy    Doxil, Velcade, and dexamethasone       02/27/2004 Bone Marrow Biopsy    Normal cellular bone marrow showing trilineage hematopoiesis with less than 10% plasma cells.      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  Followed by [facility] Department of dentistry.      06/22/2008 Progression    Relapse with The light chains in 24-hour urine found, therapy reinitiated with Revlimid/dexamethasone.      07/31/2008 - 08/10/2013 Chemotherapy    Revlimid/dexamethasone initiated for recurrent disease.       08/27/2010 - 09/10/2010 Radiation Therapy    2500 cGy to right hip for impending right femoral neck fracture      08/04/2013 Imaging    Bone density-normal with T score of -1.0.  01/19/2014 Imaging    MRI pelvis-partially visualized right L4-L5 and L5-S1 facet arthritis with periarticular edema. This can be associated with referred pain to the right hip. Small abnormal marrow signal foci in the proximal right femur are likely representing treated myeloma. There is no cortical erosion, stress reaction or stress fracture. Similar lesions are present in the right supra-acetabular ileum.      02/08/2014 Imaging    Bone survey-no significant change in  widespread myelomatous involvement of skeleton. Multiple pathologic fractures in the thoracic spine at L1 appears stable. No interval pathologic fracture identified.      12/26/2015 Tumor Marker    M-spike NOT OBSERVED; normal IgG, IgM, IgA, & kappa/lambda light chains.        02/26/2016 Treatment Plan Change    Transfer of medical oncology care to [facility]      02/26/2016 Tumor Marker    M-spike NOT OBSERVED; normal IgG, IgM, IgA, & kappa/lambda light chains.       03/22/2016 Imaging    Skeletal survey imaging: IMPRESSION: Stable appearance of widespread myelomatous involvement of the skeleton. No progressive lesions are observed. Stable appearing partial compressions of thoracic vertebral bodies. Stable vertebra plana at T12.      05/29/2016 Tumor Marker    M-spike NOT OBSERVED; normal IgG, IgM, IgA, & kappa/lambda light chains.         Problem List Patient Active Problem List   Diagnosis Date Noted  . Multiple myeloma in remission ([place]) [C90.01] 11/27/2015  . 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 Chronic right shoulder pain [M25.511, G89.29] 08/14/2014  . ED (erectile dysfunction) [N52.9] 12/20/2013  . Port-A-Cath in place [facility] 05/24/2013  . Back pain, chronic [M54.9, G89.29] 04/26/2013    Past Medical History Past Medical History:  Diagnosis Date  . Multiple myeloma in remission ([place]) 11/27/2015    Past Surgical History History reviewed. No pertinent surgical history.  Family History Family History  Problem Relation Age of Onset  . Diabetes Mother   . Cancer Sister      Social History  reports that he has never smoked. He has never used smokeless tobacco. He reports that he does not drink alcohol or use drugs.  Medications  Current Outpatient Medications:  .  aspirin (GOODSENSE ASPIRIN) 325 MG tablet, Take 325 mg by mouth., Disp: , Rfl:  .  calcium citrate-vitamin D (CITRACAL+D) 315-200 MG-UNIT tablet, Take by  mouth., Disp: , Rfl:  .  folic acid (FOLVITE) 1 MG tablet, Take 1 mg by mouth., Disp: , Rfl:  .  lidocaine-prilocaine (EMLA) cream, Apply 1 application topically as needed., Disp: , Rfl:  .  methadone (DOLOPHINE) 10 MG tablet, Take 1 tablet (10 mg total) by mouth every 12 (twelve) hours., Disp: 60 tablet, Rfl: 0 .  polyethylene glycol (MIRALAX / GLYCOLAX) packet, Take by mouth., Disp: , Rfl:  .  chlorhexidine (PERIDEX) 0.12 % solution, USE AS DIRECTED 15MLS IN THE MOUTH OR THROAT 2 TIMES A DAY, Disp: 473 mL, Rfl: 2 .  potassium chloride SA (K-DUR,KLOR-CON) 20 MEQ tablet, Take 1 tablet (20 mEq total) by mouth daily., Disp: 30 tablet, Rfl: 5 No current facility-administered medications for this visit.   Facility-Administered Medications Ordered in Other Visits:  .  sodium chloride flush (NS) 0.9 % injection 10 mL, 10 mL, Intravenous, PRN, Baird Cancer, PA-C, 10 mL at 06/19/17 1029  Allergies Patient has no known allergies.  Review of Systems Review of Systems - Oncology ROS as  per HPI otherwise 12 point ROS is negative.   Physical Exam  Vitals Wt Readings from Last 3 Encounters:  09/22/17 168 lb 3.2 oz (76.3 kg)  06/19/17 166 lb (75.3 kg)  03/02/17 170 lb 6.4 oz (77.3 kg)   Temp Readings from Last 3 Encounters:  09/22/17 98.3 F (36.8 C) (Oral)  06/19/17 98.3 F (36.8 C) (Oral)  04/28/17 (!) 97.4 F (36.3 C) (Oral)   BP Readings from Last 3 Encounters:  09/22/17 (!) 168/85  06/19/17 (!) 147/98  04/28/17 (!) 156/73   Pulse Readings from Last 3 Encounters:  09/22/17 68  06/19/17 78  04/28/17 61    Constitutional: Well-developed, well-nourished, and in no distress.   HENT: Head: Normocephalic and atraumatic.  Mouth/Throat: No oropharyngeal exudate. Mucosa moist. Eyes: Pupils are equal, round, and reactive to light. Conjunctivae are normal. No scleral icterus.  Neck: Normal range of motion. Neck supple. No JVD present.  Cardiovascular: Normal rate, regular rhythm  and normal heart sounds.  Exam reveals no gallop and no friction rub.   No murmur heard. Pulmonary/Chest: Effort normal and breath sounds normal. No respiratory distress. No wheezes.No rales.  Abdominal: Soft. Bowel sounds are normal. No distension. There is no tenderness. There is no guarding.  Musculoskeletal: No edema or tenderness.  Lymphadenopathy: No cervical, axillary or supraclavicular adenopathy.  Neurological: Alert and oriented to person, place, and time. No cranial nerve deficit.  Skin: Skin is warm and dry. No rash noted. No erythema. No pallor.  Psychiatric: Affect and judgment normal.   Labs Infusion on 09/22/2017  Component Date Value Ref Range Status  . Total Protein ELP 09/22/2017 7.1  6.0 - 8.5 g/dL Final  . Albumin ELP 09/22/2017 3.9  2.9 - 4.4 g/dL Final  . Alpha-1-Globulin 09/22/2017 0.2  0.0 - 0.4 g/dL Final  . Alpha-2-Globulin 09/22/2017 0.5  0.4 - 1.0 g/dL Final  . Beta Globulin 09/22/2017 1.2  0.7 - 1.3 g/dL Final  . Gamma Globulin 09/22/2017 1.2  0.4 - 1.8 g/dL Final  . M-Spike, % 09/22/2017 Not Observed  Not Observed g/dL Final  . SPE Interp. 09/22/2017 Comment   Final   Comment: (NOTE) The SPE pattern appears essentially unremarkable. Evidence of monoclonal protein is not apparent. Performed At: [facility] [place], [place] [id number] Rush Farmer MD [id number]   . Comment 09/22/2017 Comment   Final   Comment: (NOTE) Protein electrophoresis scan will follow via computer, mail, or courier delivery.   Marland Kitchen GLOBULIN, TOTAL 09/22/2017 3.2  2.2 - 3.9 g/dL Corrected  . A/G Ratio 09/22/2017 1.2  0.7 - 1.7 Corrected   Performed at [facility], [address] [place], [place] [address]  . Total Protein ELP 09/22/2017 7.0  6.0 - 8.5 g/dL Final  . IgG (Immunoglobin G), Serum 09/22/2017 1,206  700 - 1,600 mg/dL Final  . IgA 09/22/2017 314  90 - 386 mg/dL Final  . IgM (Immunoglobulin M), Srm 09/22/2017 85  20 - 172 mg/dL Final    Comment: (NOTE) Performed At: [facility] [address] [place], [place] [id number] Rush Farmer MD [id number]   . Immunofixation Result, Serum 09/22/2017 Comment   Corrected   Comment: An apparent normal immunofixation pattern. Performed at [facility], [address] [place], [place] [address]   . IgG (Immunoglobin G), Serum 09/22/2017 1,191  700 - 1,600 mg/dL Final  . IgA 09/22/2017 314  90 - 386 mg/dL Final  . IgM (Immunoglobulin M), Srm 09/22/2017 87  20 - 172  mg/dL Final   Comment: (NOTE) Performed At: [facility] [place], [place] [id number] Rush Farmer MD [id number] Performed at [facility], [address] [place], [place] [address]   . Beta-2 Microglobulin 09/22/2017 1.2  0.6 - 2.4 mg/L Final   Comment: (NOTE) Siemens Immulite 2000 Immunochemiluminometric assay (ICMA) Values obtained with different assay methods or kits cannot be used interchangeably. Results cannot be interpreted as absolute evidence of the presence or absence of malignant disease. Performed At: [facility] [place], [place] [id number] Rush Farmer MD [id number] Performed at [facility], [address], [place], [place] [address]   . Kappa free light chain 09/22/2017 11.7  3.3 - 19.4 mg/L Final  . Lamda free light chains 09/22/2017 11.4  5.7 - 26.3 mg/L Final  . Kappa, lamda light chain ratio 09/22/2017 1.03  0.26 - 1.65 Final   Comment: (NOTE) Performed At: [facility] [place], [place] [id number] Rush Farmer MD [id number] Performed at [facility], [address], [place], [place] [address]   . LDH 09/22/2017 138  98 - 192 U/L Final   Performed at [facility], [address], [place], [place] [address]  . Sodium 09/22/2017 138  135 - 145 mmol/L Final  . Potassium 09/22/2017 3.2* 3.5 - 5.1 mmol/L Final  . Chloride 09/22/2017 101  101 - 111 mmol/L Final  . CO2 09/22/2017 25  22 - 32 mmol/L Final   . Glucose, Bld 09/22/2017 119* 65 - 99 mg/dL Final  . BUN 09/22/2017 14  6 - 20 mg/dL Final  . Creatinine, Ser 09/22/2017 0.93  0.61 - 1.24 mg/dL Final  . Calcium 09/22/2017 8.4* 8.9 - 10.3 mg/dL Final  . Total Protein 09/22/2017 7.5  6.5 - 8.1 g/dL Final  . Albumin 09/22/2017 4.2  3.5 - 5.0 g/dL Final  . AST 09/22/2017 22  15 - 41 U/L Final  . ALT 09/22/2017 14* 17 - 63 U/L Final  . Alkaline Phosphatase 09/22/2017 49  38 - 126 U/L Final  . Total Bilirubin 09/22/2017 1.3* 0.3 - 1.2 mg/dL Final  . GFR calc non Af Amer 09/22/2017 >60  >60 mL/min Final  . GFR calc Af Amer 09/22/2017 >60  >60 mL/min Final   Comment: (NOTE) The eGFR has been calculated using the CKD EPI equation. This calculation has not been validated in all clinical situations. eGFR's persistently <60 mL/min signify possible Chronic Kidney Disease.   Georgiann Hahn gap 09/22/2017 12  5 - 15 Final   Performed at [facility], [address] [place], [place] [address]  . WBC 09/22/2017 4.2  4.0 - 10.5 K/uL Final  . RBC 09/22/2017 5.07  4.22 - 5.81 MIL/uL Final  . Hemoglobin 09/22/2017 12.0* 13.0 - 17.0 g/dL Final  . HCT 09/22/2017 38.2* 39.0 - 52.0 % Final  . MCV 09/22/2017 75.3* 78.0 - 100.0 fL Final  . MCH 09/22/2017 23.7* 26.0 - 34.0 pg Final  . MCHC 09/22/2017 31.4  30.0 - 36.0 g/dL Final  . RDW 09/22/2017 17.0* 11.5 - 15.5 % Final  . Platelets 09/22/2017 130* 150 - 400 K/uL Final   Comment: SPECIMEN CHECKED FOR CLOTS PLATELET COUNT CONFIRMED BY SMEAR   . Neutrophils Relative % 09/22/2017 49  % Final  . Neutro Abs 09/22/2017 2.0  1.7 - 7.7 K/uL Final  . Lymphocytes Relative 09/22/2017 41  % Final  . Lymphs Abs 09/22/2017 1.7  0.7 - 4.0 K/uL Final  . Monocytes Relative 09/22/2017 9  %  Final  . Monocytes Absolute 09/22/2017 0.4  0.1 - 1.0 K/uL Final  . Eosinophils Relative 09/22/2017 1  % Final  . Eosinophils Absolute 09/22/2017 0.1  0.0 - 0.7 K/uL Final  . Basophils Relative 09/22/2017 0  % Final  . Basophils  Absolute 09/22/2017 0.0  0.0 - 0.1 K/uL Final   Performed at [facility], [address] [place], [place] [address]     Pathology Orders Placed This Encounter  Procedures  . CBC with Differential/Platelet    Standing Status:   Future    Standing Expiration Date:   09/23/2018  . Comprehensive metabolic panel    Standing Status:   Future    Standing Expiration Date:   09/23/2018  . Lactate dehydrogenase    Standing Status:   Future    Standing Expiration Date:   09/23/2018  . Protein electrophoresis, serum    Standing Status:   Future    Standing Expiration Date:   09/23/2018  . Kappa/lambda light chains    Standing Status:   Future    Standing Expiration Date:   09/23/2018  . Beta 2 microglobuline, serum    Standing Status:   Future    Standing Expiration Date:   09/23/2018  . IgG, IgA, IgM    Standing Status:   Future    Standing Expiration Date:   09/23/2018       Zoila Shutter MD

## 2017-10-26 ENCOUNTER — Other Ambulatory Visit (HOSPITAL_COMMUNITY): Payer: Self-pay | Admitting: Hematology

## 2017-10-26 ENCOUNTER — Telehealth (HOSPITAL_COMMUNITY): Payer: Self-pay | Admitting: *Deleted

## 2017-10-26 DIAGNOSIS — C9001 Multiple myeloma in remission: Secondary | ICD-10-CM

## 2017-10-26 MED ORDER — METHADONE HCL 10 MG PO TABS: 10.0000 mg | ORAL_TABLET | Freq: Two times a day (BID) | ORAL | 0 refills | Status: DC

## 2017-11-23 ENCOUNTER — Encounter (HOSPITAL_COMMUNITY): Payer: Medicare Other

## 2017-11-27 ENCOUNTER — Telehealth (HOSPITAL_COMMUNITY): Payer: Self-pay

## 2017-11-27 DIAGNOSIS — C9001 Multiple myeloma in remission: Secondary | ICD-10-CM

## 2017-11-27 MED ORDER — METHADONE HCL 10 MG PO TABS: 10.0000 mg | ORAL_TABLET | Freq: Two times a day (BID) | ORAL | 0 refills | Status: DC

## 2017-11-27 NOTE — Telephone Encounter (Signed)
Refill for Methadone by Dr. Walden Field.  Per oncologist she will refill this time and the next refill will need to be done by Dr. Delton Coombes with his next oncology follow up visit.  Patient notified to come to the [facility] to pick up his prescription.

## 2017-11-27 NOTE — Telephone Encounter (Signed)
-----   Message from Veronda Prude sent at 11/26/2017  9:32 AM EDT ----- Patient is requesting a refill on methadone (DOLOPHINE) 10 MG tablet to [facility] in [place].

## 2017-11-27 NOTE — Telephone Encounter (Signed)
Request given to Dr. Walden Field for refill.  Oncologist stated she would review and get back with a nurse.

## 2017-11-27 NOTE — Addendum Note (Signed)
Addended by: Charlyne Petrin B on: 11/27/2017 01:21 PM   Modules accepted: Orders

## 2017-12-14 ENCOUNTER — Other Ambulatory Visit (HOSPITAL_COMMUNITY): Payer: Self-pay | Admitting: Oncology

## 2017-12-14 DIAGNOSIS — C9001 Multiple myeloma in remission: Secondary | ICD-10-CM

## 2017-12-15 ENCOUNTER — Inpatient Hospital Stay (HOSPITAL_COMMUNITY): Payer: Medicare Other | Attending: Hematology and Oncology

## 2017-12-15 ENCOUNTER — Other Ambulatory Visit: Payer: Self-pay

## 2017-12-15 ENCOUNTER — Encounter (HOSPITAL_COMMUNITY): Payer: Self-pay

## 2017-12-15 DIAGNOSIS — Z452 Encounter for adjustment and management of vascular access device: Secondary | ICD-10-CM | POA: Diagnosis not present

## 2017-12-15 DIAGNOSIS — G8929 Other chronic pain: Secondary | ICD-10-CM | POA: Diagnosis not present

## 2017-12-15 DIAGNOSIS — C9001 Multiple myeloma in remission: Secondary | ICD-10-CM

## 2017-12-15 LAB — COMPREHENSIVE METABOLIC PANEL
ALT: 15 U/L (ref 0–44)
AST: 21 U/L (ref 15–41)
Albumin: 4 g/dL (ref 3.5–5.0)
Alkaline Phosphatase: 41 U/L (ref 38–126)
Anion gap: 6 (ref 5–15)
BUN: 14 mg/dL (ref 6–20)
CO2: 27 mmol/L (ref 22–32)
Calcium: 8.6 mg/dL — ABNORMAL LOW (ref 8.9–10.3)
Chloride: 109 mmol/L (ref 98–111)
Creatinine, Ser: 0.89 mg/dL (ref 0.61–1.24)
GFR calc Af Amer: >60 mL/min (ref >60)
GFR calc non Af Amer: >60 mL/min (ref >60)
Glucose, Bld: 118 mg/dL — ABNORMAL HIGH (ref 70–99)
Potassium: 3.5 mmol/L (ref 3.5–5.1)
Sodium: 142 mmol/L (ref 135–145)
Total Bilirubin: 1.3 mg/dL — ABNORMAL HIGH (ref 0.3–1.2)
Total Protein: 7.2 g/dL (ref 6.5–8.1)

## 2017-12-15 LAB — CBC WITH DIFFERENTIAL/PLATELET
Basophils Absolute: 0 10*3/uL (ref 0.0–0.1)
Basophils Relative: 0 %
Eosinophils Absolute: 0.2 10*3/uL (ref 0.0–0.7)
Eosinophils Relative: 5 %
HCT: 38.8 % — ABNORMAL LOW (ref 39.0–52.0)
Hemoglobin: 12.1 g/dL — ABNORMAL LOW (ref 13.0–17.0)
Lymphocytes Relative: 34 %
Lymphs Abs: 1.4 10*3/uL (ref 0.7–4.0)
MCH: 23.9 pg — ABNORMAL LOW (ref 26.0–34.0)
MCHC: 31.2 g/dL (ref 30.0–36.0)
MCV: 76.5 fL — ABNORMAL LOW (ref 78.0–100.0)
Monocytes Absolute: 0.3 10*3/uL (ref 0.1–1.0)
Monocytes Relative: 8 %
Neutro Abs: 2.1 10*3/uL (ref 1.7–7.7)
Neutrophils Relative %: 53 %
Platelets: 103 10*3/uL — ABNORMAL LOW (ref 150–400)
RBC: 5.07 MIL/uL (ref 4.22–5.81)
RDW: 16.1 % — ABNORMAL HIGH (ref 11.5–15.5)
WBC: 4 10*3/uL (ref 4.0–10.5)

## 2017-12-15 LAB — LACTATE DEHYDROGENASE: LDH: 129 U/L (ref 98–192)

## 2017-12-15 MED ORDER — SODIUM CHLORIDE 0.9% FLUSH
10.0000 mL | INTRAVENOUS | Status: DC | PRN
  Administered 2017-12-15: 10 mL via INTRAVENOUS
  Filled 2017-12-15: qty 10

## 2017-12-15 MED ORDER — HEPARIN SOD (PORK) LOCK FLUSH 100 UNIT/ML IV SOLN
INTRAVENOUS | Status: AC
  Filled 2017-12-15: qty 5

## 2017-12-15 MED ORDER — HEPARIN SOD (PORK) LOCK FLUSH 100 UNIT/ML IV SOLN
500.0000 [IU] | Freq: Once | INTRAVENOUS | Status: AC
  Administered 2017-12-15: 500 [IU] via INTRAVENOUS

## 2017-12-15 NOTE — Patient Instructions (Signed)
Port flush in 12 weeks

## 2017-12-16 LAB — BETA 2 MICROGLOBULIN, SERUM: Beta-2 Microglobulin: 1.2 mg/L (ref 0.6–2.4)

## 2017-12-16 LAB — IGG, IGA, IGM
IgA: 297 mg/dL (ref 90–386)
IgG (Immunoglobin G), Serum: 1124 mg/dL (ref 700–1600)
IgM (Immunoglobulin M), Srm: 89 mg/dL (ref 20–172)

## 2017-12-16 LAB — PROTEIN ELECTROPHORESIS, SERUM
A/G Ratio: 1.2 (ref 0.7–1.7)
Albumin ELP: 3.6 g/dL (ref 2.9–4.4)
Alpha-1-Globulin: 0.2 g/dL (ref 0.0–0.4)
Alpha-2-Globulin: 0.5 g/dL (ref 0.4–1.0)
Beta Globulin: 1.1 g/dL (ref 0.7–1.3)
Gamma Globulin: 1.2 g/dL (ref 0.4–1.8)
Globulin, Total: 3 g/dL (ref 2.2–3.9)
Total Protein ELP: 6.6 g/dL (ref 6.0–8.5)

## 2017-12-16 LAB — KAPPA/LAMBDA LIGHT CHAINS
Kappa free light chain: 12.1 mg/L (ref 3.3–19.4)
Kappa, lambda light chain ratio: 1.19 (ref 0.26–1.65)
Lambda free light chains: 10.2 mg/L (ref 5.7–26.3)

## 2017-12-23 ENCOUNTER — Other Ambulatory Visit (HOSPITAL_COMMUNITY): Payer: Self-pay | Admitting: *Deleted

## 2017-12-23 DIAGNOSIS — C9001 Multiple myeloma in remission: Secondary | ICD-10-CM

## 2017-12-24 ENCOUNTER — Encounter (HOSPITAL_COMMUNITY): Payer: Self-pay | Admitting: Hematology

## 2017-12-24 ENCOUNTER — Inpatient Hospital Stay (HOSPITAL_BASED_OUTPATIENT_CLINIC_OR_DEPARTMENT_OTHER): Payer: Medicare Other | Admitting: Hematology

## 2017-12-24 ENCOUNTER — Other Ambulatory Visit: Payer: Self-pay

## 2017-12-24 VITALS — BP 156/78 | HR 62 | Temp 97.7°F | Resp 18 | Wt 168.4 lb

## 2017-12-24 DIAGNOSIS — M546 Pain in thoracic spine: Principal | ICD-10-CM

## 2017-12-24 DIAGNOSIS — Z79899 Other long term (current) drug therapy: Secondary | ICD-10-CM

## 2017-12-24 DIAGNOSIS — G8929 Other chronic pain: Secondary | ICD-10-CM

## 2017-12-24 DIAGNOSIS — C9001 Multiple myeloma in remission: Secondary | ICD-10-CM | POA: Diagnosis not present

## 2017-12-24 DIAGNOSIS — Z452 Encounter for adjustment and management of vascular access device: Secondary | ICD-10-CM | POA: Diagnosis not present

## 2017-12-24 LAB — RAPID URINE DRUG SCREEN, HOSP PERFORMED
Amphetamines: NOT DETECTED
Benzodiazepines: NOT DETECTED
Cocaine: NOT DETECTED
Opiates: NOT DETECTED
Tetrahydrocannabinol: NOT DETECTED

## 2017-12-24 MED ORDER — CHLORHEXIDINE GLUCONATE 0.12 % MT SOLN: 15.0000 mL | Freq: Two times a day (BID) | OROMUCOSAL | 2 refills | Status: DC

## 2017-12-24 MED ORDER — METHADONE HCL 10 MG PO TABS: 10.0000 mg | ORAL_TABLET | Freq: Two times a day (BID) | ORAL | 0 refills | Status: DC

## 2017-12-24 MED ORDER — POTASSIUM CHLORIDE CRYS ER 20 MEQ PO TBCR: 20.0000 meq | EXTENDED_RELEASE_TABLET | Freq: Every day | ORAL | 5 refills | Status: DC

## 2017-12-24 NOTE — Assessment & Plan Note (Signed)
1.  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  He was on Revlimid maintenance until 2015.  Zometa was also discontinued prior to that. - I have reviewed skeletal survey results from 06/19/2017 which showed stable lytic lesions in the axial and appendicular skeleton.  No new lesions were seen. - We have also reviewed recent myeloma studies which were negative.  His calcium and creatinine were within normal limits. - He denies any B symptoms or recurrent infections.  We will see him back in 3 months with repeat myeloma panel.  2.  Chronic mid back pain: -This is from higher vertebral fracture.  He is taking methadone 10 mg every 12 hours. -We have made him sign a pain contract today.  We have sent his urine for drug screening.  We have refilled his prescription.

## 2017-12-24 NOTE — Progress Notes (Signed)
[place], [place] [address]   CLINIC:  Medical Oncology/Hematology  PCP:  Monico Blitz, [place] [address] [phone]   REASON FOR VISIT:  Follow-up for multiple myeloma  CURRENT THERAPY: observation  BRIEF ONCOLOGIC HISTORY:    Multiple myeloma in remission ([place])   06/05/2003 Initial Diagnosis    Myeloma, presenting with excruciating back pain and T12 vertebral body fracture, kappa light chain multiple myeloma diagnosed with 2030 mg light chains per 24 hours and urine      06/09/2003 - 06/22/2003 Radiation Therapy    19 Gray to T11-L1 or T12 vertebral body fracture. [place] T2-T4 for T3 vertebral body involvement      06/26/2003 - 07/25/2004 Chemotherapy    Doxil, Velcade, and dexamethasone       02/27/2004 Bone Marrow Biopsy    Normal cellular bone marrow showing trilineage hematopoiesis with less than 10% plasma cells.      07/25/2004 Bone Marrow Transplant    Transplant after high-dose chemotherapy with melphalan 200 mg/m, but no maintenance therapy was given, transplant at [facility].      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  Followed by [facility] Department of dentistry.      06/22/2008 Progression    Relapse with The light chains in 24-hour urine found, therapy reinitiated with Revlimid/dexamethasone.      07/31/2008 - 08/10/2013 Chemotherapy    Revlimid/dexamethasone initiated for recurrent disease.       08/27/2010 - 09/10/2010 Radiation Therapy    2500 cGy to right hip for impending right femoral neck fracture      08/04/2013 Imaging    Bone density-normal with T score of -1.0.      01/19/2014 Imaging    MRI pelvis-partially visualized right L4-L5 and L5-S1 facet arthritis with periarticular edema. This can be associated with referred pain to the right hip. Small abnormal marrow signal foci in the proximal right femur are likely representing treated myeloma. There is no  cortical erosion, stress reaction or stress fracture. Similar lesions are present in the right supra-acetabular ileum.      02/08/2014 Imaging    Bone survey-no significant change in widespread myelomatous involvement of skeleton. Multiple pathologic fractures in the thoracic spine at L1 appears stable. No interval pathologic fracture identified.      12/26/2015 Tumor Marker    M-spike NOT OBSERVED; normal IgG, IgM, IgA, & kappa/lambda light chains.        02/26/2016 Treatment Plan Change    Transfer of medical oncology care to [facility]      02/26/2016 Tumor Marker    M-spike NOT OBSERVED; normal IgG, IgM, IgA, & kappa/lambda light chains.       03/22/2016 Imaging    Skeletal survey imaging: IMPRESSION: Stable appearance of widespread myelomatous involvement of the skeleton. No progressive lesions are observed. Stable appearing partial compressions of thoracic vertebral bodies. Stable vertebra plana at T12.      05/29/2016 Tumor Marker    M-spike NOT OBSERVED; normal IgG, IgM, IgA, & kappa/lambda light chains.         INTERVAL HISTORY:  Steven [name] 57 y.o. male returns for routine follow-up for multiple myeloma. Patient has middle back pain. This is chronic back pain. He wears are back brace for support. He has been on Methadone to manage his pain. His feet are numb and tingling which is stable. Denies in fevers or infections. Denies  any new pains. Overall, he tells me he has been feeling pretty well. Energy levels 100%; appetite 100%. Patient agreed and signed a opioid agreement policy. He will leave a urine specimen today.     REVIEW OF SYSTEMS:  Review of Systems  Musculoskeletal: Positive for back pain.  All other systems reviewed and are negative.    PAST MEDICAL/SURGICAL HISTORY:  Past Medical History:  Diagnosis Date  . Multiple myeloma in remission ([place]) 11/27/2015   History reviewed. No pertinent surgical history.   SOCIAL HISTORY:  Social  History   Socioeconomic History  . Marital status: Married    Spouse name: Not on file  . Number of children: Not on file  . Years of education: Not on file  . Highest education level: Not on file  Occupational History  . Not on file  Social Needs  . Financial resource strain: Not on file  . Food insecurity:    Worry: Not on file    Inability: Not on file  . Transportation needs:    Medical: Not on file    Non-medical: Not on file  Tobacco Use  . Smoking status: Never Smoker  . Smokeless tobacco: Never Used  Substance and Sexual Activity  . Alcohol use: No  . Drug use: No  . Sexual activity: Not on file  Lifestyle  . Physical activity:    Days per week: Not on file    Minutes per session: Not on file  . Stress: Not on file  Relationships  . Social connections:    Talks on phone: Not on file    Gets together: Not on file    Attends religious service: Not on file    Active member of club or organization: Not on file    Attends meetings of clubs or organizations: Not on file    Relationship status: Not on file  . Intimate partner violence:    Fear of current or ex partner: Not on file    Emotionally abused: Not on file    Physically abused: Not on file    Forced sexual activity: Not on file  Other Topics Concern  . Not on file  Social History Narrative  . Not on file    FAMILY HISTORY:  Family History  Problem Relation Age of Onset  . Diabetes Mother   . Cancer Sister     CURRENT MEDICATIONS:  Outpatient Encounter Medications as of 12/24/2017  Medication Sig Note  . aspirin (GOODSENSE ASPIRIN) 325 MG tablet Take 325 mg by mouth once a week.  12/26/2015: Received from: [facility]  . calcium citrate-vitamin D (CITRACAL+D) 315-200 MG-UNIT tablet Take 1 tablet by mouth daily.  12/26/2015: Received from: [facility]  . chlorhexidine (PERIDEX) 0.12 % solution Use as directed 15 mLs in the mouth or throat 2 (two) times daily.   . folic acid (FOLVITE) 1 MG  tablet Take 1 mg by mouth. 12/26/2015: Received from: [facility]  . lidocaine-prilocaine (EMLA) cream Apply 1 application topically as needed.   . methadone (DOLOPHINE) 10 MG tablet Take 1 tablet (10 mg total) by mouth every 12 (twelve) hours.   . polyethylene glycol (MIRALAX / GLYCOLAX) packet Take 17 g by mouth daily as needed.  12/26/2015: Received from: [facility]  . [DISCONTINUED] chlorhexidine (PERIDEX) 0.12 % solution USE AS DIRECTED 15MLS IN THE MOUTH OR THROAT 2 TIMES A DAY   . [DISCONTINUED] methadone (DOLOPHINE) 10 MG tablet Take 1 tablet (10 mg total) by  mouth every 12 (twelve) hours.   . potassium chloride SA (K-DUR,KLOR-CON) 20 MEQ tablet Take 1 tablet (20 mEq total) by mouth daily.   . [DISCONTINUED] potassium chloride SA (K-DUR,KLOR-CON) 20 MEQ tablet Take 1 tablet (20 mEq total) by mouth daily.    Facility-Administered Encounter Medications as of 12/24/2017  Medication  . sodium chloride flush (NS) 0.9 % injection 10 mL    ALLERGIES:  No Known Allergies   PHYSICAL EXAM:  ECOG Performance status: 0  Vitals:   12/24/17 1507  BP: (!) 156/78  Pulse: 62  Resp: 18  Temp: 97.7 F (36.5 C)  SpO2: 99%   Filed Weights   12/24/17 1507  Weight: 168 lb 6.4 oz (76.4 kg)    Physical Exam   LABORATORY DATA:  I have reviewed the labs as listed.  CBC    Component Value Date/Time   WBC 4.0 12/15/2017 0916   RBC 5.07 12/15/2017 0916   HGB 12.1 (L) 12/15/2017 0916   HCT 38.8 (L) 12/15/2017 0916   PLT 103 (L) 12/15/2017 0916   MCV 76.5 (L) 12/15/2017 0916   MCH 23.9 (L) 12/15/2017 0916   MCHC 31.2 12/15/2017 0916   RDW 16.1 (H) 12/15/2017 0916   LYMPHSABS 1.4 12/15/2017 0916   MONOABS 0.3 12/15/2017 0916   EOSABS 0.2 12/15/2017 0916   BASOSABS 0.0 12/15/2017 0916   CMP Latest Ref Rng & Units 12/15/2017 09/22/2017 06/19/2017  Glucose 70 - 99 mg/dL 118(H) 119(H) 101(H)  BUN 6 - 20 mg/dL 14 14 12   Creatinine 0.61 - 1.24 mg/dL 0.89 0.93 0.97  Sodium 135 - 145  mmol/L 142 138 142  Potassium 3.5 - 5.1 mmol/L 3.5 3.2(L) 3.5  Chloride 98 - 111 mmol/L 109 101 109  CO2 22 - 32 mmol/L 27 25 24   Calcium 8.9 - 10.3 mg/dL 8.6(L) 8.4(L) 8.6(L)  Total Protein 6.5 - 8.1 g/dL 7.2 7.5 7.3  Total Bilirubin 0.3 - 1.2 mg/dL 1.3(H) 1.3(H) 1.4(H)  Alkaline Phos 38 - 126 U/L 41 49 53  AST 15 - 41 U/L 21 22 19   ALT 0 - 44 U/L 15 14(L) 13(L)       DIAGNOSTIC IMAGING:  I have independently reviewed skeletal survey dated 06/19/2017.     ASSESSMENT & PLAN:   Multiple myeloma in remission ([place]) 1.  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  He was on Revlimid maintenance until 2015.  Zometa was also discontinued prior to that. - I have reviewed skeletal survey results from 06/19/2017 which showed stable lytic lesions in the axial and appendicular skeleton.  No new lesions were seen. - We have also reviewed recent myeloma studies which were negative.  His calcium and creatinine were within normal limits. - He denies any B symptoms or recurrent infections.  We will see him back in 3 months with repeat myeloma panel.  2.  Chronic mid back pain: -This is from higher vertebral fracture.  He is taking methadone 10 mg every 12 hours. -We have made him sign a pain contract today.  We have sent his urine for drug screening.  We have refilled his prescription.      Orders placed this encounter:  Orders Placed This Encounter  Procedures  . Protein electrophoresis, serum  . Lactate dehydrogenase, isoenzymes  . Immunofixation electrophoresis  . Kappa/lambda light chains  . CBC with Differential/Platelet  . Comprehensive  metabolic panel  . Urine rapid drug screen (hosp performed)      Derek Jack, MD [facility] [phone]

## 2017-12-25 ENCOUNTER — Ambulatory Visit (HOSPITAL_COMMUNITY): Payer: Medicare Other | Admitting: Hematology

## 2017-12-29 ENCOUNTER — Other Ambulatory Visit (HOSPITAL_COMMUNITY): Payer: Self-pay | Admitting: *Deleted

## 2018-01-27 ENCOUNTER — Other Ambulatory Visit (HOSPITAL_COMMUNITY): Payer: Self-pay

## 2018-01-27 DIAGNOSIS — M546 Pain in thoracic spine: Secondary | ICD-10-CM

## 2018-01-27 DIAGNOSIS — C9001 Multiple myeloma in remission: Secondary | ICD-10-CM

## 2018-01-27 DIAGNOSIS — G8929 Other chronic pain: Secondary | ICD-10-CM

## 2018-01-27 MED ORDER — METHADONE HCL 10 MG PO TABS: 10.0000 mg | ORAL_TABLET | Freq: Two times a day (BID) | ORAL | 0 refills | Status: DC

## 2018-01-27 NOTE — Telephone Encounter (Signed)
Patient called for refill on Methadone. Pended order and sent to NP.

## 2018-01-28 ENCOUNTER — Other Ambulatory Visit (HOSPITAL_COMMUNITY): Payer: Self-pay

## 2018-01-28 DIAGNOSIS — G8929 Other chronic pain: Secondary | ICD-10-CM

## 2018-01-28 DIAGNOSIS — C9001 Multiple myeloma in remission: Secondary | ICD-10-CM

## 2018-01-28 DIAGNOSIS — M546 Pain in thoracic spine: Secondary | ICD-10-CM

## 2018-01-28 MED ORDER — METHADONE HCL 10 MG PO TABS: 10.0000 mg | ORAL_TABLET | Freq: Two times a day (BID) | ORAL | 0 refills | Status: DC

## 2018-02-26 ENCOUNTER — Other Ambulatory Visit (HOSPITAL_COMMUNITY): Payer: Self-pay | Admitting: Internal Medicine

## 2018-02-26 DIAGNOSIS — G8929 Other chronic pain: Secondary | ICD-10-CM

## 2018-02-26 DIAGNOSIS — M546 Pain in thoracic spine: Secondary | ICD-10-CM

## 2018-02-26 DIAGNOSIS — C9001 Multiple myeloma in remission: Secondary | ICD-10-CM

## 2018-02-26 MED ORDER — METHADONE HCL 10 MG PO TABS: 10.0000 mg | ORAL_TABLET | Freq: Two times a day (BID) | ORAL | 0 refills | Status: DC

## 2018-03-17 ENCOUNTER — Inpatient Hospital Stay (HOSPITAL_COMMUNITY): Payer: Medicare Other | Attending: Hematology and Oncology

## 2018-03-17 DIAGNOSIS — G8929 Other chronic pain: Secondary | ICD-10-CM | POA: Insufficient documentation

## 2018-03-17 DIAGNOSIS — Z452 Encounter for adjustment and management of vascular access device: Secondary | ICD-10-CM | POA: Insufficient documentation

## 2018-03-17 DIAGNOSIS — M549 Dorsalgia, unspecified: Secondary | ICD-10-CM | POA: Diagnosis not present

## 2018-03-17 DIAGNOSIS — C9001 Multiple myeloma in remission: Secondary | ICD-10-CM | POA: Diagnosis not present

## 2018-03-17 LAB — COMPREHENSIVE METABOLIC PANEL
ALT: 12 U/L (ref 0–44)
AST: 17 U/L (ref 15–41)
Albumin: 4 g/dL (ref 3.5–5.0)
Alkaline Phosphatase: 43 U/L (ref 38–126)
Anion gap: 7 (ref 5–15)
BUN: 14 mg/dL (ref 6–20)
CO2: 27 mmol/L (ref 22–32)
Calcium: 9 mg/dL (ref 8.9–10.3)
Chloride: 106 mmol/L (ref 98–111)
Creatinine, Ser: 0.93 mg/dL (ref 0.61–1.24)
GFR calc Af Amer: >60 mL/min (ref >60)
GFR calc non Af Amer: >60 mL/min (ref >60)
Glucose, Bld: 93 mg/dL (ref 70–99)
Potassium: 3.3 mmol/L — ABNORMAL LOW (ref 3.5–5.1)
Sodium: 140 mmol/L (ref 135–145)
Total Bilirubin: 1.3 mg/dL — ABNORMAL HIGH (ref 0.3–1.2)
Total Protein: 7.5 g/dL (ref 6.5–8.1)

## 2018-03-17 LAB — CBC WITH DIFFERENTIAL/PLATELET
Basophils Absolute: 0 10*3/uL (ref 0.0–0.1)
Basophils Relative: 1 %
Eosinophils Absolute: 0.1 10*3/uL (ref 0.0–0.7)
Eosinophils Relative: 2 %
HCT: 39.5 % (ref 39.0–52.0)
Hemoglobin: 12.5 g/dL — ABNORMAL LOW (ref 13.0–17.0)
Lymphocytes Relative: 46 %
Lymphs Abs: 2.1 10*3/uL (ref 0.7–4.0)
MCH: 24.1 pg — ABNORMAL LOW (ref 26.0–34.0)
MCHC: 31.6 g/dL (ref 30.0–36.0)
MCV: 76.1 fL — ABNORMAL LOW (ref 78.0–100.0)
Monocytes Absolute: 0.3 10*3/uL (ref 0.1–1.0)
Monocytes Relative: 8 %
Neutro Abs: 2 10*3/uL (ref 1.7–7.7)
Neutrophils Relative %: 43 %
Platelets: 112 10*3/uL — ABNORMAL LOW (ref 150–400)
RBC: 5.19 MIL/uL (ref 4.22–5.81)
RDW: 16 % — ABNORMAL HIGH (ref 11.5–15.5)
WBC: 4.6 10*3/uL (ref 4.0–10.5)

## 2018-03-17 MED ORDER — SODIUM CHLORIDE 0.9% FLUSH
10.0000 mL | Freq: Once | INTRAVENOUS | Status: AC
  Administered 2018-03-17: 10 mL via INTRAVENOUS

## 2018-03-17 MED ORDER — HEPARIN SOD (PORK) LOCK FLUSH 100 UNIT/ML IV SOLN
500.0000 [IU] | Freq: Once | INTRAVENOUS | Status: AC
  Administered 2018-03-17: 500 [IU] via INTRAVENOUS

## 2018-03-17 NOTE — Progress Notes (Signed)
Patient tolerated port flush with lab work drawn.  Good blood return noted before and after lab work.  No bruising or swelling noted site. Band aid applied.  Left with VSS and no s/s of distress noted.  Family with patient.

## 2018-03-17 NOTE — Patient Instructions (Signed)
[place] at [facility]  Discharge Instructions:  Your port was flushed with labs.  _______________________________________________________________  Thank you for choosing [place] at [facility] to provide your oncology and hematology care.  To afford each patient quality time with our providers, please arrive at least 15 minutes before your scheduled appointment.  You need to re-schedule your appointment if you arrive 10 or more minutes late.  We strive to give you quality time with our providers, and arriving late affects you and other patients whose appointments are after yours.  Also, if you no show three or more times for appointments you may be dismissed from the clinic.  Again, thank you for choosing [facility] hope is that these requests will allow you access to exceptional care and in a timely manner. _______________________________________________________________  If you have questions after your visit, please contact our office at [phone] between the hours of 8:30 a.m. and 5:00 p.m. Voicemails left after 4:30 p.m. will not be returned until the following business day. _______________________________________________________________  For prescription refill requests, have your pharmacy contact our office. _______________________________________________________________  Recommendations made by the consultant and any test results will be sent to your referring physician. _______________________________________________________________

## 2018-03-18 LAB — KAPPA/LAMBDA LIGHT CHAINS
Kappa free light chain: 12.7 mg/L (ref 3.3–19.4)
Kappa, lambda light chain ratio: 1.03 (ref 0.26–1.65)
Lambda free light chains: 12.3 mg/L (ref 5.7–26.3)

## 2018-03-18 LAB — PROTEIN ELECTROPHORESIS, SERUM
A/G Ratio: 1.3 (ref 0.7–1.7)
Albumin ELP: 4 g/dL (ref 2.9–4.4)
Alpha-1-Globulin: 0.2 g/dL (ref 0.0–0.4)
Alpha-2-Globulin: 0.5 g/dL (ref 0.4–1.0)
Beta Globulin: 1.2 g/dL (ref 0.7–1.3)
Gamma Globulin: 1.2 g/dL (ref 0.4–1.8)
Globulin, Total: 3 g/dL (ref 2.2–3.9)
Total Protein ELP: 7 g/dL (ref 6.0–8.5)

## 2018-03-19 LAB — IMMUNOFIXATION ELECTROPHORESIS
IgA: 313 mg/dL (ref 90–386)
IgG (Immunoglobin G), Serum: 1244 mg/dL (ref 700–1600)
IgM (Immunoglobulin M), Srm: 98 mg/dL (ref 20–172)
Total Protein ELP: 6.9 g/dL (ref 6.0–8.5)

## 2018-03-20 LAB — LACTATE DEHYDROGENASE, ISOENZYMES
LDH 1: 24 % (ref 17–32)
LDH 2: 26 % (ref 25–40)
LDH 3: 22 % (ref 17–27)
LDH 4: 11 % (ref 5–13)
LDH 5: 17 % (ref 4–20)
LDH Isoenzymes, Total: 179 IU/L (ref 121–224)

## 2018-03-22 ENCOUNTER — Inpatient Hospital Stay (HOSPITAL_BASED_OUTPATIENT_CLINIC_OR_DEPARTMENT_OTHER): Payer: Medicare Other | Admitting: Hematology

## 2018-03-22 ENCOUNTER — Encounter (HOSPITAL_COMMUNITY): Payer: Self-pay | Admitting: Hematology

## 2018-03-22 DIAGNOSIS — M549 Dorsalgia, unspecified: Secondary | ICD-10-CM

## 2018-03-22 DIAGNOSIS — C9001 Multiple myeloma in remission: Secondary | ICD-10-CM

## 2018-03-22 DIAGNOSIS — Z452 Encounter for adjustment and management of vascular access device: Secondary | ICD-10-CM | POA: Diagnosis not present

## 2018-03-22 DIAGNOSIS — G8929 Other chronic pain: Secondary | ICD-10-CM | POA: Diagnosis not present

## 2018-03-22 NOTE — Patient Instructions (Signed)
[facility] at [facility] Discharge Instructions     Thank you for choosing [facility] at [facility] to provide your oncology and hematology care.  To afford each patient quality time with our provider, please arrive at least 15 minutes before your scheduled appointment time.   If you have a lab appointment with the Cancer Center please come in thru the  Main Entrance and check in at the main information desk  You need to re-schedule your appointment should you arrive 10 or more minutes late.  We strive to give you quality time with our providers, and arriving late affects you and other patients whose appointments are after yours.  Also, if you no show three or more times for appointments you may be dismissed from the clinic at the providers discretion.     Again, thank you for choosing [facility].  Our hope is that these requests will decrease the amount of time that you wait before being seen by our physicians.       _____________________________________________________________  Should you have questions after your visit to [facility], please contact our office at [phone] between the hours of 8:00 a.m. and 4:30 p.m.  Voicemails left after 4:00 p.m. will not be returned until the following business day.  For prescription refill requests, have your pharmacy contact our office and allow 72 hours.    Cancer Center Support Programs:   > Cancer Support Group  2nd Tuesday of the month 1pm-2pm, Journey Room    

## 2018-03-22 NOTE — Assessment & Plan Note (Signed)
1.  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  He was on Revlimid maintenance until 2015.  Zometa was also discontinued prior to that. - Skeletal survey on 06/19/2017 showed stable lytic lesions in the axial and appendicular skeleton.  No new lesions were seen. - His multiple myeloma panel including immunofixation and SPEP were within normal limits on 03/17/2018.  Free light chain ratio was 1.03.  Hemoglobin was 12.5. -he does not have any new onset pains.  No recurrent infections were reported.  He continues to be in remission at this time.  He will come back in 3 months with repeat blood work.  2.  Chronic mid back pain: -This is from higher vertebral fracture.  We have made him sign pain contract at last visit. - He is taking methadone 10 mg every 12 hours.  This is controlling his pain.

## 2018-03-22 NOTE — Progress Notes (Signed)
[place], [place] [address]   CLINIC:  Medical Oncology/Hematology  PCP:  Monico Blitz, [place] [address] [phone]   REASON FOR VISIT: Follow-up for multiple myeloma  CURRENT THERAPY: Observation  BRIEF ONCOLOGIC HISTORY:    Multiple myeloma in remission ([place])   06/05/2003 Initial Diagnosis    Myeloma, presenting with excruciating back pain and T12 vertebral body fracture, kappa light chain multiple myeloma diagnosed with 2030 mg light chains per 24 hours and urine    06/09/2003 - 06/22/2003 Radiation Therapy    19 Gray to T11-L1 or T12 vertebral body fracture. [place] T2-T4 for T3 vertebral body involvement    06/26/2003 - 07/25/2004 Chemotherapy    Doxil, Velcade, and dexamethasone     02/27/2004 Bone Marrow Biopsy    Normal cellular bone marrow showing trilineage hematopoiesis with less than 10% plasma cells.    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  Followed by [facility] Department of dentistry.    06/22/2008 Progression    Relapse with The light chains in 24-hour urine found, therapy reinitiated with Revlimid/dexamethasone.    07/31/2008 - 08/10/2013 Chemotherapy    Revlimid/dexamethasone initiated for recurrent disease.     08/27/2010 - 09/10/2010 Radiation Therapy    2500 cGy to right hip for impending right femoral neck fracture    08/04/2013 Imaging    Bone density-normal with T score of -1.0.    01/19/2014 Imaging    MRI pelvis-partially visualized right L4-L5 and L5-S1 facet arthritis with periarticular edema. This can be associated with referred pain to the right hip. Small abnormal marrow signal foci in the proximal right femur are likely representing treated myeloma. There is no cortical erosion, stress reaction or  stress fracture. Similar lesions are present in the right supra-acetabular ileum.    02/08/2014 Imaging    Bone survey-no significant change in widespread myelomatous involvement of skeleton. Multiple pathologic fractures in the thoracic spine at L1 appears stable. No interval pathologic fracture identified.    12/26/2015 Tumor Marker    M-spike NOT OBSERVED; normal IgG, IgM, IgA, & kappa/lambda light chains.      02/26/2016 Treatment Plan Change    Transfer of medical oncology care to [facility]    02/26/2016 Tumor Marker    M-spike NOT OBSERVED; normal IgG, IgM, IgA, & kappa/lambda light chains.     03/22/2016 Imaging    Skeletal survey imaging: IMPRESSION: Stable appearance of widespread myelomatous involvement of the skeleton. No progressive lesions are observed. Stable appearing partial compressions of thoracic vertebral bodies. Stable vertebra plana at T12.    05/29/2016 Tumor Marker    M-spike NOT OBSERVED; normal IgG, IgM, IgA, & kappa/lambda light chains.       INTERVAL HISTORY:  Mr. Nestle 57 y.o. male returns for routine follow-up for multiple myeloma. Patient is here today with his wife. He is still having pain. His medication is controlling the pain. This is chronic pain. He wears his back brace for support. He is on methadone and is seen by the pain clinic. He report numbness and tingling in his feet this is stable at this time. He reports his appetite and energy level at 100% and he is maintaining his weight at this time. He denies any fevers,  weight loss, chills, or night sweats. Denies any nausea, vomiting, or diarrhea. Denies any new pains.    REVIEW OF SYSTEMS:  Review of Systems  Neurological: Positive for numbness.  All other systems reviewed and are negative.    PAST MEDICAL/SURGICAL HISTORY:  Past Medical History:  Diagnosis Date  . Multiple myeloma in remission ([facility]) 11/27/2015   History reviewed. No pertinent surgical history.   SOCIAL  HISTORY:  Social History   Socioeconomic History  . Marital status: Married    Spouse name: Not on file  . Number of children: Not on file  . Years of education: Not on file  . Highest education level: Not on file  Occupational History  . Not on file  Social Needs  . Financial resource strain: Not on file  . Food insecurity:    Worry: Not on file    Inability: Not on file  . Transportation needs:    Medical: Not on file    Non-medical: Not on file  Tobacco Use  . Smoking status: Never Smoker  . Smokeless tobacco: Never Used  Substance and Sexual Activity  . Alcohol use: No  . Drug use: No  . Sexual activity: Not on file  Lifestyle  . Physical activity:    Days per week: Not on file    Minutes per session: Not on file  . Stress: Not on file  Relationships  . Social connections:    Talks on phone: Not on file    Gets together: Not on file    Attends religious service: Not on file    Active member of club or organization: Not on file    Attends meetings of clubs or organizations: Not on file    Relationship status: Not on file  . Intimate partner violence:    Fear of current or ex partner: Not on file    Emotionally abused: Not on file    Physically abused: Not on file    Forced sexual activity: Not on file  Other Topics Concern  . Not on file  Social History Narrative  . Not on file    FAMILY HISTORY:  Family History  Problem Relation Age of Onset  . Diabetes Mother   . Cancer Sister     CURRENT MEDICATIONS:  Outpatient Encounter Medications as of 03/22/2018  Medication Sig Note  . aspirin (GOODSENSE ASPIRIN) 325 MG tablet Take 325 mg by mouth once a week.  12/26/2015: Received from: [facility]  . calcium citrate-vitamin D (CITRACAL+D) 315-200 MG-UNIT tablet Take 1 tablet by mouth daily.  12/26/2015: Received from: [facility]  . chlorhexidine (PERIDEX) 0.12 % solution Use as directed 15 mLs in the mouth or throat 2 (two) times daily.   . folic acid  (FOLVITE) 1 MG tablet Take 1 mg by mouth. 12/26/2015: Received from: [facility]  . lidocaine-prilocaine (EMLA) cream Apply 1 application topically as needed.   . methadone (DOLOPHINE) 10 MG tablet Take 1 tablet (10 mg total) by mouth every 12 (twelve) hours.   . polyethylene glycol (MIRALAX / GLYCOLAX) packet Take 17 g by mouth daily as needed.  12/26/2015: Received from: [facility]  . potassium chloride SA (K-DUR,KLOR-CON) 20 MEQ tablet Take 1 tablet (20 mEq total) by mouth daily.    Facility-Administered Encounter Medications as of 03/22/2018  Medication  . sodium chloride flush (NS) 0.9 % injection 10 mL    ALLERGIES:  No Known Allergies   PHYSICAL EXAM:  ECOG Performance status:   1  Vitals:   03/22/18 1439 03/22/18 1449  BP: (!) 182/89 (!) 155/88  Pulse: 78   Resp: 20   Temp: 98.2 F (36.8 C)   SpO2: 98%    Filed Weights   03/22/18 1439  Weight: 170 lb 6.4 oz (77.3 kg)    Physical Exam  Constitutional: He is oriented to person, place, and time. He appears well-developed and well-nourished.  Cardiovascular: Normal rate, regular rhythm and normal heart sounds.  Pulmonary/Chest: Effort normal and breath sounds normal.  Musculoskeletal: Normal range of motion.  Neurological: He is alert and oriented to person, place, and time.  Skin: Skin is warm and dry.  Psychiatric: He has a normal mood and affect. His behavior is normal. Judgment and thought content normal.     LABORATORY DATA:  I have reviewed the labs as listed.  CBC    Component Value Date/Time   WBC 4.6 03/17/2018 1252   RBC 5.19 03/17/2018 1252   HGB 12.5 (L) 03/17/2018 1252   HCT 39.5 03/17/2018 1252   PLT 112 (L) 03/17/2018 1252   MCV 76.1 (L) 03/17/2018 1252   MCH 24.1 (L) 03/17/2018 1252   MCHC 31.6 03/17/2018 1252   RDW 16.0 (H) 03/17/2018 1252   LYMPHSABS 2.1 03/17/2018 1252   MONOABS 0.3 03/17/2018 1252   EOSABS 0.1 03/17/2018 1252   BASOSABS 0.0 03/17/2018 1252   CMP Latest Ref Rng  & Units 03/17/2018 12/15/2017 09/22/2017  Glucose 70 - 99 mg/dL 93 118(H) 119(H)  BUN 6 - 20 mg/dL 14 14 14  Creatinine 0.61 - 1.24 mg/dL 0.93 0.89 0.93  Sodium 135 - 145 mmol/L 140 142 138  Potassium 3.5 - 5.1 mmol/L 3.3(L) 3.5 3.2(L)  Chloride 98 - 111 mmol/L 106 109 101  CO2 22 - 32 mmol/L 27 27 25  Calcium 8.9 - 10.3 mg/dL 9.0 8.6(L) 8.4(L)  Total Protein 6.5 - 8.1 g/dL 7.5 7.2 7.5  Total Bilirubin 0.3 - 1.2 mg/dL 1.3(H) 1.3(H) 1.3(H)  Alkaline Phos 38 - 126 U/L 43 41 49  AST 15 - 41 U/L 17 21 22  ALT 0 - 44 U/L 12 15 14(L)         ASSESSMENT & PLAN:   Multiple myeloma in remission ([facility]) 1.  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  He was on Revlimid maintenance until 2015.  Zometa was also discontinued prior to that. - Skeletal survey on 06/19/2017 showed stable lytic lesions in the axial and appendicular skeleton.  No new lesions were seen. - His multiple myeloma panel including immunofixation and SPEP were within normal limits on 03/17/2018.  Free light chain ratio was 1.03.  Hemoglobin was 12.5. -he does not have any new onset pains.  No recurrent infections were reported.  He continues to be in remission at this time.  He will come back in 3 months with repeat blood work.  2.  Chronic mid back pain: -This is from higher vertebral fracture.  We have made him sign pain contract at last visit. - He is taking methadone 10 mg every 12 hours.  This is controlling his pain.        Orders placed this encounter:  Orders Placed This Encounter  Procedures  . Protein electrophoresis, serum  . Kappa/lambda light chains  . Lactate dehydrogenase  . IgG, IgA, IgM  . CBC with Differential/Platelet  . Comprehensive   metabolic panel      Sreedhar Katragadda, MD [facility] [phone]  

## 2018-03-29 ENCOUNTER — Other Ambulatory Visit (HOSPITAL_COMMUNITY): Payer: Self-pay

## 2018-03-29 DIAGNOSIS — G8929 Other chronic pain: Secondary | ICD-10-CM

## 2018-03-29 DIAGNOSIS — M546 Pain in thoracic spine: Secondary | ICD-10-CM

## 2018-03-29 DIAGNOSIS — C9001 Multiple myeloma in remission: Secondary | ICD-10-CM

## 2018-03-29 MED ORDER — METHADONE HCL 10 MG PO TABS: 10.0000 mg | ORAL_TABLET | Freq: Two times a day (BID) | ORAL | 0 refills | Status: DC

## 2018-04-28 ENCOUNTER — Other Ambulatory Visit (HOSPITAL_COMMUNITY): Payer: Self-pay | Admitting: *Deleted

## 2018-04-28 DIAGNOSIS — C9001 Multiple myeloma in remission: Secondary | ICD-10-CM

## 2018-04-28 DIAGNOSIS — M546 Pain in thoracic spine: Secondary | ICD-10-CM

## 2018-04-28 DIAGNOSIS — G8929 Other chronic pain: Secondary | ICD-10-CM

## 2018-04-28 MED ORDER — METHADONE HCL 10 MG PO TABS: 10.0000 mg | ORAL_TABLET | Freq: Two times a day (BID) | ORAL | 0 refills | Status: DC

## 2018-05-27 ENCOUNTER — Other Ambulatory Visit (HOSPITAL_COMMUNITY): Payer: Self-pay | Admitting: *Deleted

## 2018-05-27 DIAGNOSIS — G8929 Other chronic pain: Secondary | ICD-10-CM

## 2018-05-27 DIAGNOSIS — M546 Pain in thoracic spine: Secondary | ICD-10-CM

## 2018-05-27 DIAGNOSIS — C9001 Multiple myeloma in remission: Secondary | ICD-10-CM

## 2018-05-27 MED ORDER — METHADONE HCL 10 MG PO TABS: 10.0000 mg | ORAL_TABLET | Freq: Two times a day (BID) | ORAL | 0 refills | Status: DC

## 2018-06-17 ENCOUNTER — Inpatient Hospital Stay (HOSPITAL_COMMUNITY): Payer: Medicare Other | Attending: Hematology and Oncology

## 2018-06-17 DIAGNOSIS — C9001 Multiple myeloma in remission: Secondary | ICD-10-CM | POA: Insufficient documentation

## 2018-06-17 DIAGNOSIS — Z452 Encounter for adjustment and management of vascular access device: Secondary | ICD-10-CM | POA: Insufficient documentation

## 2018-06-17 DIAGNOSIS — G8929 Other chronic pain: Secondary | ICD-10-CM | POA: Diagnosis not present

## 2018-06-17 LAB — CBC WITH DIFFERENTIAL/PLATELET
Abs Immature Granulocytes: 0.01 10*3/uL (ref 0.00–0.07)
Basophils Absolute: 0 10*3/uL (ref 0.0–0.1)
Basophils Relative: 0 %
Eosinophils Absolute: 0 10*3/uL (ref 0.0–0.5)
Eosinophils Relative: 1 %
HCT: 40.2 % (ref 39.0–52.0)
Hemoglobin: 11.8 g/dL — ABNORMAL LOW (ref 13.0–17.0)
Immature Granulocytes: 0 %
Lymphocytes Relative: 27 %
Lymphs Abs: 1.5 10*3/uL (ref 0.7–4.0)
MCH: 23 pg — ABNORMAL LOW (ref 26.0–34.0)
MCHC: 29.4 g/dL — ABNORMAL LOW (ref 30.0–36.0)
MCV: 78.4 fL — ABNORMAL LOW (ref 80.0–100.0)
Monocytes Absolute: 0.4 10*3/uL (ref 0.1–1.0)
Monocytes Relative: 8 %
Neutro Abs: 3.7 10*3/uL (ref 1.7–7.7)
Neutrophils Relative %: 64 %
Platelets: 120 10*3/uL — ABNORMAL LOW (ref 150–400)
RBC: 5.13 MIL/uL (ref 4.22–5.81)
RDW: 17 % — ABNORMAL HIGH (ref 11.5–15.5)
WBC: 5.7 10*3/uL (ref 4.0–10.5)
nRBC: 0 % (ref 0.0–0.2)

## 2018-06-17 LAB — COMPREHENSIVE METABOLIC PANEL
ALT: 12 U/L (ref 0–44)
AST: 20 U/L (ref 15–41)
Albumin: 4.2 g/dL (ref 3.5–5.0)
Alkaline Phosphatase: 46 U/L (ref 38–126)
Anion gap: 8 (ref 5–15)
BUN: 16 mg/dL (ref 6–20)
CO2: 25 mmol/L (ref 22–32)
Calcium: 8.6 mg/dL — ABNORMAL LOW (ref 8.9–10.3)
Chloride: 108 mmol/L (ref 98–111)
Creatinine, Ser: 0.88 mg/dL (ref 0.61–1.24)
GFR calc Af Amer: >60 mL/min (ref >60)
GFR calc non Af Amer: >60 mL/min (ref >60)
Glucose, Bld: 117 mg/dL — ABNORMAL HIGH (ref 70–99)
Potassium: 3.5 mmol/L (ref 3.5–5.1)
Sodium: 141 mmol/L (ref 135–145)
Total Bilirubin: 0.9 mg/dL (ref 0.3–1.2)
Total Protein: 7.4 g/dL (ref 6.5–8.1)

## 2018-06-17 LAB — LACTATE DEHYDROGENASE: LDH: 134 U/L (ref 98–192)

## 2018-06-17 MED ORDER — HEPARIN SOD (PORK) LOCK FLUSH 100 UNIT/ML IV SOLN
500.0000 [IU] | Freq: Once | INTRAVENOUS | Status: AC
  Administered 2018-06-17: 500 [IU] via INTRAVENOUS

## 2018-06-17 MED ORDER — SODIUM CHLORIDE 0.9% FLUSH
10.0000 mL | Freq: Once | INTRAVENOUS | Status: AC
  Administered 2018-06-17: 10 mL via INTRAVENOUS

## 2018-06-17 NOTE — Progress Notes (Signed)
Steven [name] presented for Portacath access and flush.  Proper placement of portacath confirmed by CXR.  Portacath located right chest wall accessed with  H 20 needle.  Good blood return present. Portacath flushed with 7ml NS and 500U/70ml Heparin and needle removed intact.  Procedure tolerated well and without incident.

## 2018-06-17 NOTE — Patient Instructions (Signed)
[place] at [facility]  Discharge Instructions:  You had your port flushed and labs drawn today. _______________________________________________________________  Thank you for choosing [place] at [name] to provide your oncology and hematology care.  To afford each patient quality time with our providers, please arrive at least 15 minutes before your scheduled appointment.  You need to re-schedule your appointment if you arrive 10 or more minutes late.  We strive to give you quality time with our providers, and arriving late affects you and other patients whose appointments are after yours.  Also, if you no show three or more times for appointments you may be dismissed from the clinic.  Again, thank you for choosing [place] at [place] hope is that these requests will allow you access to exceptional care and in a timely manner. _______________________________________________________________  If you have questions after your visit, please contact our office at [phone] between the hours of 8:30 a.m. and 5:00 p.m. Voicemails left after 4:30 p.m. will not be returned until the following business day. _______________________________________________________________  For prescription refill requests, have your pharmacy contact our office. _______________________________________________________________  Recommendations made by the consultant and any test results will be sent to your referring physician. _______________________________________________________________

## 2018-06-18 ENCOUNTER — Other Ambulatory Visit (HOSPITAL_COMMUNITY): Payer: Self-pay | Admitting: Nurse Practitioner

## 2018-06-18 DIAGNOSIS — C9001 Multiple myeloma in remission: Secondary | ICD-10-CM

## 2018-06-18 LAB — KAPPA/LAMBDA LIGHT CHAINS
Kappa free light chain: 12.8 mg/L (ref 3.3–19.4)
Kappa, lambda light chain ratio: 1.78 — ABNORMAL HIGH (ref 0.26–1.65)
Lambda free light chains: 7.2 mg/L (ref 5.7–26.3)

## 2018-06-18 LAB — IGG, IGA, IGM
IgA: 326 mg/dL (ref 90–386)
IgG (Immunoglobin G), Serum: 1235 mg/dL (ref 700–1600)
IgM (Immunoglobulin M), Srm: 102 mg/dL (ref 20–172)

## 2018-06-21 LAB — PROTEIN ELECTROPHORESIS, SERUM
A/G Ratio: 1.6 (ref 0.7–1.7)
Albumin ELP: 4.4 g/dL (ref 2.9–4.4)
Alpha-1-Globulin: 0.1 g/dL (ref 0.0–0.4)
Alpha-2-Globulin: 0.4 g/dL (ref 0.4–1.0)
Beta Globulin: 1.1 g/dL (ref 0.7–1.3)
Gamma Globulin: 1.1 g/dL (ref 0.4–1.8)
Globulin, Total: 2.7 g/dL (ref 2.2–3.9)
Total Protein ELP: 7.1 g/dL (ref 6.0–8.5)

## 2018-06-22 ENCOUNTER — Encounter (HOSPITAL_COMMUNITY): Payer: Self-pay | Admitting: Hematology

## 2018-06-22 ENCOUNTER — Other Ambulatory Visit: Payer: Self-pay

## 2018-06-22 ENCOUNTER — Inpatient Hospital Stay (HOSPITAL_BASED_OUTPATIENT_CLINIC_OR_DEPARTMENT_OTHER): Payer: Medicare Other | Admitting: Hematology

## 2018-06-22 VITALS — BP 155/85 | HR 85 | Temp 98.2°F | Resp 18 | Wt 170.5 lb

## 2018-06-22 DIAGNOSIS — Z452 Encounter for adjustment and management of vascular access device: Secondary | ICD-10-CM | POA: Diagnosis not present

## 2018-06-22 DIAGNOSIS — G8929 Other chronic pain: Secondary | ICD-10-CM | POA: Diagnosis not present

## 2018-06-22 DIAGNOSIS — C9001 Multiple myeloma in remission: Secondary | ICD-10-CM | POA: Diagnosis not present

## 2018-06-22 LAB — PROTEIN ELECTROPHORESIS, SERUM

## 2018-06-22 NOTE — Patient Instructions (Signed)
[facility] at [facility] Discharge Instructions  Follow up in 3 months with labs    Thank you for choosing [facility] at [facility] to provide your oncology and hematology care.  To afford each patient quality time with our provider, please arrive at least 15 minutes before your scheduled appointment time.   If you have a lab appointment with the Cancer Center please come in thru the  Main Entrance and check in at the main information desk  You need to re-schedule your appointment should you arrive 10 or more minutes late.  We strive to give you quality time with our providers, and arriving late affects you and other patients whose appointments are after yours.  Also, if you no show three or more times for appointments you may be dismissed from the clinic at the providers discretion.     Again, thank you for choosing [facility].  Our hope is that these requests will decrease the amount of time that you wait before being seen by our physicians.       _____________________________________________________________  Should you have questions after your visit to [facility], please contact our office at [phone] between the hours of 8:00 a.m. and 4:30 p.m.  Voicemails left after 4:00 p.m. will not be returned until the following business day.  For prescription refill requests, have your pharmacy contact our office and allow 72 hours.    Cancer Center Support Programs:   > Cancer Support Group  2nd Tuesday of the month 1pm-2pm, Journey Room    

## 2018-06-22 NOTE — Assessment & Plan Note (Signed)
1.  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  He was on Revlimid maintenance until 2015.  Zometa was also discontinued prior to that. - Skeletal survey on 06/19/2017 showed stable lytic lesions in the axial and appendicular skeleton.  No new lesions were seen. - He did not have any new onset bone pains in the preceding 3 months.  No fevers or infections noted.  No ER visits or hospitalizations. - We reviewed his blood work from 06/17/2018.  Free kappa light chain was elevated at 1.78.  However SPEP and immunofixation were negative.  CBC and CMP were normal. -We will bring him back in 3 months and repeat his counts.  2.  Chronic mid back pain: -This is from her vertebral fracture.  He signed a pain contract. -He is taking methadone 10 mg every 12 hours which is controlling the pain.

## 2018-06-22 NOTE — Progress Notes (Signed)
[place], [place] [address]   CLINIC:  Medical Oncology/Hematology  PCP:  Monico Blitz, [place] [address] [phone]   REASON FOR VISIT: Follow-up for multiple myeloma  CURRENT THERAPY: Observation  BRIEF ONCOLOGIC HISTORY:    Multiple myeloma in remission ([place])   06/05/2003 Initial Diagnosis    Myeloma, presenting with excruciating back pain and T12 vertebral body fracture, kappa light chain multiple myeloma diagnosed with 2030 mg light chains per 24 hours and urine    06/09/2003 - 06/22/2003 Radiation Therapy    19 [place] to T11-L1 or T12 vertebral body fracture. [place] T2-T4 for T3 vertebral body involvement    06/26/2003 - 07/25/2004 Chemotherapy    Doxil, Velcade, and dexamethasone     02/27/2004 Bone Marrow Biopsy    Normal cellular bone marrow showing trilineage hematopoiesis with less than 10% plasma cells.    07/25/2004 Bone Marrow Transplant    Transplant after high-dose chemotherapy with melphalan 200 mg/m, but no maintenance therapy was given, transplant at [facility].    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  Followed by [facility] Department of dentistry.    06/22/2008 Progression    Relapse with The light chains in 24-hour urine found, therapy reinitiated with Revlimid/dexamethasone.    07/31/2008 - 08/10/2013 Chemotherapy    Revlimid/dexamethasone initiated for recurrent disease.     08/27/2010 - 09/10/2010 Radiation Therapy    2500 cGy to right hip for impending right femoral neck fracture    08/04/2013 Imaging    Bone density-normal with T score of -1.0.    01/19/2014 Imaging    MRI pelvis-partially visualized right L4-L5 and L5-S1 facet arthritis with periarticular edema. This can be associated with referred pain to the right hip. Small abnormal marrow signal foci in the proximal right femur are likely representing treated myeloma. There is no cortical erosion, stress reaction or  stress fracture. Similar lesions are present in the right supra-acetabular ileum.    02/08/2014 Imaging    Bone survey-no significant change in widespread myelomatous involvement of skeleton. Multiple pathologic fractures in the thoracic spine at L1 appears stable. No interval pathologic fracture identified.    12/26/2015 Tumor Marker    M-spike NOT OBSERVED; normal IgG, IgM, IgA, & kappa/lambda light chains.      02/26/2016 Treatment Plan Change    Transfer of medical oncology care to [facility]    02/26/2016 Tumor Marker    M-spike NOT OBSERVED; normal IgG, IgM, IgA, & kappa/lambda light chains.     03/22/2016 Imaging    Skeletal survey imaging: IMPRESSION: Stable appearance of widespread myelomatous involvement of the skeleton. No progressive lesions are observed. Stable appearing partial compressions of thoracic vertebral bodies. Stable vertebra plana at T12.    05/29/2016 Tumor Marker    M-spike NOT OBSERVED; normal IgG, IgM, IgA, & kappa/lambda light chains.       INTERVAL HISTORY:  Steven [name] 58 y.o. male returns for routine follow-up for multiple myeloma. He is here today with his wife. He reports he is feeling good. He has no complaints at this time. He does report numbness in his feet but this is stable at this time. Denies any nausea, vomiting, or diarrhea. Denies any new pains. Had not noticed any recent bleeding such as epistaxis, hematuria or hematochezia. Denies recent chest pain on exertion, shortness of breath on minimal exertion, pre-syncopal episodes, or palpitations. Denies any recent fevers,  infections, or recent hospitalizations. He reports his appetite and energy level at 100%.     REVIEW OF SYSTEMS:  Review of Systems  Neurological: Positive for numbness.  All other systems reviewed and are negative.    PAST MEDICAL/SURGICAL HISTORY:  Past Medical History:  Diagnosis Date  . Multiple myeloma in remission ([place]) 11/27/2015   History reviewed.  No pertinent surgical history.   SOCIAL HISTORY:  Social History   Socioeconomic History  . Marital status: Married    Spouse name: Not on file  . Number of children: Not on file  . Years of education: Not on file  . Highest education level: Not on file  Occupational History  . Not on file  Social Needs  . Financial resource strain: Not on file  . Food insecurity:    Worry: Not on file    Inability: Not on file  . Transportation needs:    Medical: Not on file    Non-medical: Not on file  Tobacco Use  . Smoking status: Never Smoker  . Smokeless tobacco: Never Used  Substance and Sexual Activity  . Alcohol use: No  . Drug use: No  . Sexual activity: Not on file  Lifestyle  . Physical activity:    Days per week: Not on file    Minutes per session: Not on file  . Stress: Not on file  Relationships  . Social connections:    Talks on phone: Not on file    Gets together: Not on file    Attends religious service: Not on file    Active member of club or organization: Not on file    Attends meetings of clubs or organizations: Not on file    Relationship status: Not on file  . Intimate partner violence:    Fear of current or ex partner: Not on file    Emotionally abused: Not on file    Physically abused: Not on file    Forced sexual activity: Not on file  Other Topics Concern  . Not on file  Social History Narrative  . Not on file    FAMILY HISTORY:  Family History  Problem Relation Age of Onset  . Diabetes Mother   . Cancer Sister     CURRENT MEDICATIONS:  Outpatient Encounter Medications as of 06/22/2018  Medication Sig Note  . aspirin (GOODSENSE ASPIRIN) 325 MG tablet Take 325 mg by mouth once a week.  12/26/2015: Received from: [facility]  . calcium citrate-vitamin D (CITRACAL+D) 315-200 MG-UNIT tablet Take 1 tablet by mouth daily.  12/26/2015: Received from: [facility]  . chlorhexidine (PERIDEX) 0.12 % solution Use as directed 15 mLs in the mouth or  throat 2 (two) times daily.   . folic acid (FOLVITE) 1 MG tablet Take 1 mg by mouth. 12/26/2015: Received from: [facility]  . KLOR-CON M20 20 MEQ tablet TAKE 1 TABLET BY MOUTH EVERY DAY   . lidocaine-prilocaine (EMLA) cream Apply 1 application topically as needed.   . methadone (DOLOPHINE) 10 MG tablet Take 1 tablet (10 mg total) by mouth every 12 (twelve) hours.   . polyethylene glycol (MIRALAX / GLYCOLAX) packet Take 17 g by mouth daily as needed.  12/26/2015: Received from: [facility]   Facility-Administered Encounter Medications as of 06/22/2018  Medication  . sodium chloride flush (NS) 0.9 % injection 10 mL    ALLERGIES:  No Known Allergies   PHYSICAL EXAM:  ECOG Performance status: 1  Vitals:   06/22/18  1100  BP: (!) 155/85  Pulse: 85  Resp: 18  Temp: 98.2 F (36.8 C)  SpO2: 97%   Filed Weights   06/22/18 1100  Weight: 170 lb 8 oz (77.3 kg)    Physical Exam Constitutional:      Appearance: Normal appearance. He is normal weight.  Abdominal:     General: Abdomen is flat.     Palpations: Abdomen is soft.  Musculoskeletal: Normal range of motion.  Skin:    General: Skin is warm and dry.  Neurological:     Mental Status: He is alert and oriented to person, place, and time. Mental status is at baseline.  Psychiatric:        Mood and Affect: Mood normal.        Behavior: Behavior normal.        Thought Content: Thought content normal.        Judgment: Judgment normal.      LABORATORY DATA:  I have reviewed the labs as listed.  CBC    Component Value Date/Time   WBC 5.7 06/17/2018 1136   RBC 5.13 06/17/2018 1136   HGB 11.8 (L) 06/17/2018 1136   HCT 40.2 06/17/2018 1136   PLT 120 (L) 06/17/2018 1136   MCV 78.4 (L) 06/17/2018 1136   MCH 23.0 (L) 06/17/2018 1136   MCHC 29.4 (L) 06/17/2018 1136   RDW 17.0 (H) 06/17/2018 1136   LYMPHSABS 1.5 06/17/2018 1136   MONOABS 0.4 06/17/2018 1136   EOSABS 0.0 06/17/2018 1136   BASOSABS 0.0 06/17/2018 1136     CMP Latest Ref Rng & Units 06/17/2018 03/17/2018 12/15/2017  Glucose 70 - 99 mg/dL 117(H) 93 118(H)  BUN 6 - 20 mg/dL 16 14 14   Creatinine 0.61 - 1.24 mg/dL 0.88 0.93 0.89  Sodium 135 - 145 mmol/L 141 140 142  Potassium 3.5 - 5.1 mmol/L 3.5 3.3(L) 3.5  Chloride 98 - 111 mmol/L 108 106 109  CO2 22 - 32 mmol/L 25 27 27   Calcium 8.9 - 10.3 mg/dL 8.6(L) 9.0 8.6(L)  Total Protein 6.5 - 8.1 g/dL 7.4 7.5 7.2  Total Bilirubin 0.3 - 1.2 mg/dL 0.9 1.3(H) 1.3(H)  Alkaline Phos 38 - 126 U/L 46 43 41  AST 15 - 41 U/L 20 17 21   ALT 0 - 44 U/L 12 12 15        DIAGNOSTIC IMAGING:  I have independently reviewed the scans and discussed with the patient.   I have reviewed Francene Finders, NP's note and agree with the documentation.  I personally performed a face-to-face visit, made revisions and my assessment and plan is as follows.    ASSESSMENT & PLAN:   Multiple myeloma in remission ([place]) 1.  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  He was on Revlimid maintenance until 2015.  Zometa was also discontinued prior to that. - Skeletal survey on 06/19/2017 showed stable lytic lesions in the axial and appendicular skeleton.  No new lesions were seen. - He did not have any new onset bone pains in the preceding 3 months.  No fevers or infections noted.  No ER visits or hospitalizations. - We reviewed his blood work from 06/17/2018.  Free kappa light chain was elevated at 1.78.  However SPEP and immunofixation were negative.  CBC and CMP were normal. -We will bring him back in 3 months and repeat his counts.  2.  Chronic mid back pain: -This is from her vertebral fracture.  He signed a pain contract. -He is taking methadone 10 mg every 12 hours which is controlling the pain.       Orders placed this encounter:  Orders Placed This Encounter  Procedures  . IgG, IgA, IgM  .  Lactate dehydrogenase  . Protein electrophoresis, serum  . Kappa/lambda light chains  . CBC with Differential/Platelet  . Comprehensive metabolic panel      Steven Jack, MD [place] [phone]

## 2018-06-28 ENCOUNTER — Other Ambulatory Visit (HOSPITAL_COMMUNITY): Payer: Self-pay | Admitting: *Deleted

## 2018-06-28 DIAGNOSIS — G8929 Other chronic pain: Secondary | ICD-10-CM

## 2018-06-28 DIAGNOSIS — C9001 Multiple myeloma in remission: Secondary | ICD-10-CM

## 2018-06-28 DIAGNOSIS — M546 Pain in thoracic spine: Secondary | ICD-10-CM

## 2018-06-28 MED ORDER — METHADONE HCL 10 MG PO TABS: 10.0000 mg | ORAL_TABLET | Freq: Two times a day (BID) | ORAL | 0 refills | Status: DC

## 2018-07-29 ENCOUNTER — Other Ambulatory Visit (HOSPITAL_COMMUNITY): Payer: Self-pay | Admitting: *Deleted

## 2018-07-29 DIAGNOSIS — C9001 Multiple myeloma in remission: Secondary | ICD-10-CM

## 2018-07-29 DIAGNOSIS — M546 Pain in thoracic spine: Secondary | ICD-10-CM

## 2018-07-29 DIAGNOSIS — G8929 Other chronic pain: Secondary | ICD-10-CM

## 2018-07-29 MED ORDER — METHADONE HCL 10 MG PO TABS: 10.0000 mg | ORAL_TABLET | Freq: Two times a day (BID) | ORAL | 0 refills | Status: DC

## 2018-07-30 ENCOUNTER — Other Ambulatory Visit (HOSPITAL_COMMUNITY): Payer: Self-pay | Admitting: Nurse Practitioner

## 2018-07-30 DIAGNOSIS — C9001 Multiple myeloma in remission: Secondary | ICD-10-CM

## 2018-08-26 ENCOUNTER — Other Ambulatory Visit (HOSPITAL_COMMUNITY): Payer: Self-pay | Admitting: *Deleted

## 2018-08-26 DIAGNOSIS — G8929 Other chronic pain: Secondary | ICD-10-CM

## 2018-08-26 DIAGNOSIS — C9001 Multiple myeloma in remission: Secondary | ICD-10-CM

## 2018-08-26 DIAGNOSIS — M546 Pain in thoracic spine: Secondary | ICD-10-CM

## 2018-08-26 MED ORDER — METHADONE HCL 10 MG PO TABS: 10.0000 mg | ORAL_TABLET | Freq: Two times a day (BID) | ORAL | 0 refills | Status: DC

## 2018-09-15 ENCOUNTER — Other Ambulatory Visit (HOSPITAL_COMMUNITY): Payer: Medicare Other

## 2018-09-21 ENCOUNTER — Ambulatory Visit (HOSPITAL_COMMUNITY): Payer: Medicare Other | Admitting: Hematology

## 2018-09-27 ENCOUNTER — Other Ambulatory Visit (HOSPITAL_COMMUNITY): Payer: Self-pay | Admitting: *Deleted

## 2018-09-27 DIAGNOSIS — G8929 Other chronic pain: Secondary | ICD-10-CM

## 2018-09-27 DIAGNOSIS — M546 Pain in thoracic spine: Secondary | ICD-10-CM

## 2018-09-27 DIAGNOSIS — C9001 Multiple myeloma in remission: Secondary | ICD-10-CM

## 2018-09-27 MED ORDER — METHADONE HCL 10 MG PO TABS: 10.0000 mg | ORAL_TABLET | Freq: Two times a day (BID) | ORAL | 0 refills | Status: DC

## 2018-10-15 ENCOUNTER — Encounter (HOSPITAL_COMMUNITY): Payer: Self-pay

## 2018-10-15 ENCOUNTER — Other Ambulatory Visit: Payer: Self-pay

## 2018-10-15 ENCOUNTER — Inpatient Hospital Stay (HOSPITAL_COMMUNITY): Payer: Medicare Other | Attending: Hematology and Oncology

## 2018-10-15 DIAGNOSIS — Z7982 Long term (current) use of aspirin: Secondary | ICD-10-CM | POA: Diagnosis not present

## 2018-10-15 DIAGNOSIS — Z923 Personal history of irradiation: Secondary | ICD-10-CM | POA: Diagnosis not present

## 2018-10-15 DIAGNOSIS — Z9221 Personal history of antineoplastic chemotherapy: Secondary | ICD-10-CM | POA: Insufficient documentation

## 2018-10-15 DIAGNOSIS — C9001 Multiple myeloma in remission: Secondary | ICD-10-CM | POA: Insufficient documentation

## 2018-10-15 DIAGNOSIS — Z79899 Other long term (current) drug therapy: Secondary | ICD-10-CM | POA: Diagnosis not present

## 2018-10-15 LAB — COMPREHENSIVE METABOLIC PANEL
ALT: 14 U/L (ref 0–44)
AST: 20 U/L (ref 15–41)
Albumin: 3.9 g/dL (ref 3.5–5.0)
Alkaline Phosphatase: 46 U/L (ref 38–126)
Anion gap: 7 (ref 5–15)
BUN: 12 mg/dL (ref 6–20)
CO2: 25 mmol/L (ref 22–32)
Calcium: 8.1 mg/dL — ABNORMAL LOW (ref 8.9–10.3)
Chloride: 109 mmol/L (ref 98–111)
Creatinine, Ser: 0.84 mg/dL (ref 0.61–1.24)
GFR calc Af Amer: >60 mL/min (ref >60)
GFR calc non Af Amer: >60 mL/min (ref >60)
Glucose, Bld: 95 mg/dL (ref 70–99)
Potassium: 3.7 mmol/L (ref 3.5–5.1)
Sodium: 141 mmol/L (ref 135–145)
Total Bilirubin: 1.2 mg/dL (ref 0.3–1.2)
Total Protein: 7 g/dL (ref 6.5–8.1)

## 2018-10-15 LAB — CBC WITH DIFFERENTIAL/PLATELET
Abs Immature Granulocytes: 0 10*3/uL (ref 0.00–0.07)
Basophils Absolute: 0 10*3/uL (ref 0.0–0.1)
Basophils Relative: 1 %
Eosinophils Absolute: 0.1 10*3/uL (ref 0.0–0.5)
Eosinophils Relative: 3 %
HCT: 39.9 % (ref 39.0–52.0)
Hemoglobin: 12.3 g/dL — ABNORMAL LOW (ref 13.0–17.0)
Immature Granulocytes: 0 %
Lymphocytes Relative: 49 %
Lymphs Abs: 1.9 10*3/uL (ref 0.7–4.0)
MCH: 24.1 pg — ABNORMAL LOW (ref 26.0–34.0)
MCHC: 30.8 g/dL (ref 30.0–36.0)
MCV: 78.1 fL — ABNORMAL LOW (ref 80.0–100.0)
Monocytes Absolute: 0.3 10*3/uL (ref 0.1–1.0)
Monocytes Relative: 9 %
Neutro Abs: 1.5 10*3/uL — ABNORMAL LOW (ref 1.7–7.7)
Neutrophils Relative %: 38 %
Platelets: 109 10*3/uL — ABNORMAL LOW (ref 150–400)
RBC: 5.11 MIL/uL (ref 4.22–5.81)
RDW: 16.4 % — ABNORMAL HIGH (ref 11.5–15.5)
WBC: 3.9 10*3/uL — ABNORMAL LOW (ref 4.0–10.5)
nRBC: 0 % (ref 0.0–0.2)

## 2018-10-15 LAB — LACTATE DEHYDROGENASE: LDH: 117 U/L (ref 98–192)

## 2018-10-15 MED ORDER — HEPARIN SOD (PORK) LOCK FLUSH 100 UNIT/ML IV SOLN
500.0000 [IU] | Freq: Once | INTRAVENOUS | Status: AC
  Administered 2018-10-15: 12:00:00 500 [IU] via INTRAVENOUS

## 2018-10-15 MED ORDER — SODIUM CHLORIDE 0.9% FLUSH
20.0000 mL | INTRAVENOUS | Status: DC | PRN
  Administered 2018-10-15: 12:00:00 20 mL via INTRAVENOUS
  Filled 2018-10-15: qty 20

## 2018-10-15 NOTE — Progress Notes (Signed)
Melvyn Neth [place] tolerated port lab draw well without complaints or incident. Port accessed with 20 gauge needle with blood drawn for labs ordered then flushed with 20 ml NS and 5 ml Heparin easily per protocol then de-accessed. VSS Pt discharged self ambulatory in satisfactory condition

## 2018-10-15 NOTE — Patient Instructions (Signed)
[facility] at [facility] Discharge Instructions  Labs drawn from portacath today. Follow-up as scheduled. Call clinic for any questions or concerns   Thank you for choosing [facility] at [facility] to provide your oncology and hematology care.  To afford each patient quality time with our provider, please arrive at least 15 minutes before your scheduled appointment time.   If you have a lab appointment with the Cancer Center please come in thru the  Main Entrance and check in at the main information desk  You need to re-schedule your appointment should you arrive 10 or more minutes late.  We strive to give you quality time with our providers, and arriving late affects you and other patients whose appointments are after yours.  Also, if you no show three or more times for appointments you may be dismissed from the clinic at the providers discretion.     Again, thank you for choosing [facility].  Our hope is that these requests will decrease the amount of time that you wait before being seen by our physicians.       _____________________________________________________________  Should you have questions after your visit to [facility], please contact our office at [phone] between the hours of 8:00 a.m. and 4:30 p.m.  Voicemails left after 4:00 p.m. will not be returned until the following business day.  For prescription refill requests, have your pharmacy contact our office and allow 72 hours.    Cancer Center Support Programs:   > Cancer Support Group  2nd Tuesday of the month 1pm-2pm, Journey Room   

## 2018-10-16 LAB — IGG, IGA, IGM
IgA: 314 mg/dL (ref 90–386)
IgG (Immunoglobin G), Serum: 1150 mg/dL (ref 603–1613)
IgM (Immunoglobulin M), Srm: 94 mg/dL (ref 20–172)

## 2018-10-18 LAB — PROTEIN ELECTROPHORESIS, SERUM
A/G Ratio: 1.3 (ref 0.7–1.7)
Albumin ELP: 3.5 g/dL (ref 2.9–4.4)
Alpha-1-Globulin: 0.2 g/dL (ref 0.0–0.4)
Alpha-2-Globulin: 0.6 g/dL (ref 0.4–1.0)
Beta Globulin: 0.9 g/dL (ref 0.7–1.3)
Gamma Globulin: 1.1 g/dL (ref 0.4–1.8)
Globulin, Total: 2.8 g/dL (ref 2.2–3.9)
Total Protein ELP: 6.3 g/dL (ref 6.0–8.5)

## 2018-10-18 LAB — KAPPA/LAMBDA LIGHT CHAINS
Kappa free light chain: 14.8 mg/L (ref 3.3–19.4)
Kappa, lambda light chain ratio: 1.61 (ref 0.26–1.65)
Lambda free light chains: 9.2 mg/L (ref 5.7–26.3)

## 2018-10-22 ENCOUNTER — Inpatient Hospital Stay (HOSPITAL_BASED_OUTPATIENT_CLINIC_OR_DEPARTMENT_OTHER): Payer: Medicare Other | Admitting: Hematology

## 2018-10-22 ENCOUNTER — Encounter (HOSPITAL_COMMUNITY): Payer: Self-pay | Admitting: Hematology

## 2018-10-22 ENCOUNTER — Other Ambulatory Visit: Payer: Self-pay

## 2018-10-22 VITALS — BP 173/94 | HR 77 | Temp 98.1°F | Resp 16 | Wt 176.6 lb

## 2018-10-22 DIAGNOSIS — Z923 Personal history of irradiation: Secondary | ICD-10-CM | POA: Diagnosis not present

## 2018-10-22 DIAGNOSIS — Z79899 Other long term (current) drug therapy: Secondary | ICD-10-CM

## 2018-10-22 DIAGNOSIS — C9001 Multiple myeloma in remission: Secondary | ICD-10-CM | POA: Diagnosis not present

## 2018-10-22 DIAGNOSIS — Z9221 Personal history of antineoplastic chemotherapy: Secondary | ICD-10-CM

## 2018-10-22 DIAGNOSIS — Z7982 Long term (current) use of aspirin: Secondary | ICD-10-CM

## 2018-10-22 NOTE — Progress Notes (Signed)
Steven [name],  [id number]   CLINIC:  Medical Oncology/Hematology  PCP:  Monico Blitz, [place] [address] [phone]   REASON FOR VISIT:  Follow-up for multiple myeloma  CURRENT [place]   BRIEF ONCOLOGIC HISTORY:    Multiple myeloma in remission ([place])   06/05/2003 Initial Diagnosis    Myeloma, presenting with excruciating back pain and T12 vertebral body fracture, kappa light chain multiple myeloma diagnosed with 2030 mg light chains per 24 hours and urine    06/09/2003 - 06/22/2003 Radiation Therapy    19 Gray to T11-L1 or T12 vertebral body fracture. [place] T2-T4 for T3 vertebral body involvement    06/26/2003 - 07/25/2004 Chemotherapy    Doxil, Velcade, and dexamethasone     02/27/2004 Bone Marrow Biopsy    Normal cellular bone marrow showing trilineage hematopoiesis with less than 10% plasma cells.    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  Followed by [facility] Department of dentistry.    06/22/2008 Progression    Relapse with The light chains in 24-hour urine found, therapy reinitiated with Revlimid/dexamethasone.    07/31/2008 - 08/10/2013 Chemotherapy    Revlimid/dexamethasone initiated for recurrent disease.     08/27/2010 - 09/10/2010 Radiation Therapy    2500 cGy to right hip for impending right femoral neck fracture    08/04/2013 Imaging    Bone density-normal with T score of -1.0.    01/19/2014 Imaging    MRI pelvis-partially visualized right L4-L5 and L5-S1 facet arthritis with periarticular edema. This can be associated with referred pain to the right hip. Small abnormal marrow signal foci in the proximal right femur are likely representing treated myeloma. There is no cortical erosion, stress reaction  or stress fracture. Similar lesions are present in the right supra-acetabular ileum.    02/08/2014 Imaging    Bone survey-no significant change in widespread myelomatous involvement of skeleton. Multiple pathologic fractures in the thoracic spine at L1 appears stable. No interval pathologic fracture identified.    12/26/2015 Tumor Marker    M-spike NOT OBSERVED; normal IgG, IgM, IgA, & kappa/lambda light chains.      02/26/2016 Treatment Plan Change    Transfer of medical oncology care to [facility]    02/26/2016 Tumor Marker    M-spike NOT OBSERVED; normal IgG, IgM, IgA, & kappa/lambda light chains.     03/22/2016 Imaging    Skeletal survey imaging: IMPRESSION: Stable appearance of widespread myelomatous involvement of the skeleton. No progressive lesions are observed. Stable appearing partial compressions of thoracic vertebral bodies. Stable vertebra plana at T12.    05/29/2016 Tumor Marker    M-spike NOT OBSERVED; normal IgG, IgM, IgA, & kappa/lambda light chains.       CANCER STAGING: Cancer Staging No matching staging information was found for the patient.   INTERVAL HISTORY:  Steven [name] 58 y.o. male returns for routine follow-up. He is here today alone. He states that he has been doing well since he last visit. Denies any nausea, vomiting, or diarrhea. Denies any new pains. Had not noticed any recent bleeding such as epistaxis, hematuria or hematochezia. Denies recent chest pain on exertion, shortness of breath on minimal exertion, pre-syncopal episodes, or palpitations. Denies any numbness or tingling in hands or  feet. Denies any recent fevers, infections, or recent hospitalizations. Patient reports appetite at 100% and energy level at 100%.    REVIEW OF SYSTEMS:  Review of Systems  Musculoskeletal: Positive for back pain.     PAST MEDICAL/SURGICAL HISTORY:  Past Medical History:  Diagnosis Date  . Multiple myeloma in remission ([place]) 11/27/2015    History reviewed. No pertinent surgical history.   SOCIAL HISTORY:  Social History   Socioeconomic History  . Marital status: Married    Spouse name: Not on file  . Number of children: Not on file  . Years of education: Not on file  . Highest education level: Not on file  Occupational History  . Not on file  Social Needs  . Financial resource strain: Not on file  . Food insecurity:    Worry: Not on file    Inability: Not on file  . Transportation needs:    Medical: Not on file    Non-medical: Not on file  Tobacco Use  . Smoking status: Never Smoker  . Smokeless tobacco: Never Used  Substance and Sexual Activity  . Alcohol use: No  . Drug use: No  . Sexual activity: Not on file  Lifestyle  . Physical activity:    Days per week: Not on file    Minutes per session: Not on file  . Stress: Not on file  Relationships  . Social connections:    Talks on phone: Not on file    Gets together: Not on file    Attends religious service: Not on file    Active member of club or organization: Not on file    Attends meetings of clubs or organizations: Not on file    Relationship status: Not on file  . Intimate partner violence:    Fear of current or ex partner: Not on file    Emotionally abused: Not on file    Physically abused: Not on file    Forced sexual activity: Not on file  Other Topics Concern  . Not on file  Social History Narrative  . Not on file    FAMILY HISTORY:  Family History  Problem Relation Age of Onset  . Diabetes Mother   . Cancer Sister     CURRENT MEDICATIONS:  Outpatient Encounter Medications as of 10/22/2018  Medication Sig Note  . aspirin (GOODSENSE ASPIRIN) 325 MG tablet Take 325 mg by mouth once a week.  12/26/2015: Received from: [facility]  . calcium citrate-vitamin D (CITRACAL+D) 315-200 MG-UNIT tablet Take 1 tablet by mouth daily.  12/26/2015: Received from: [place] [facility]  . chlorhexidine (PERIDEX) 0.12 % solution USE AS DIRECTED 15 MLS  IN THE MOUTH OR THROAT 2 (TWO) TIMES DAILY.   . folic acid (FOLVITE) 1 MG tablet Take 1 mg by mouth. 12/26/2015: Received from: [facility]  . KLOR-CON M20 20 MEQ tablet TAKE 1 TABLET BY MOUTH EVERY DAY   . lidocaine-prilocaine (EMLA) cream Apply 1 application topically as needed.   . methadone (DOLOPHINE) 10 MG tablet Take 1 tablet (10 mg total) by mouth every 12 (twelve) hours.   . polyethylene glycol (MIRALAX / GLYCOLAX) packet Take 17 g by mouth daily as needed.  12/26/2015: Received from: [facility]  . Pramox-PE-Glycerin-Petrolatum (PREPARATION H) 1-0.25-14.4-15 % CREA Place rectally.    Facility-Administered Encounter Medications as of 10/22/2018  Medication  . sodium chloride flush (NS) 0.9 % injection 10 mL    ALLERGIES:  No Known Allergies   PHYSICAL EXAM:  ECOG Performance status: 1  Vitals:   10/22/18 1008  BP: (!) 173/94  Pulse: 77  Resp: 16  Temp: 98.1 F (36.7 C)  SpO2: 98%   Filed Weights   10/22/18 1008  Weight: 176 lb 9.6 oz (80.1 kg)    Physical Exam Vitals signs reviewed.  Constitutional:      Appearance: Normal appearance.  Cardiovascular:     Rate and Rhythm: Normal rate and regular rhythm.     Heart sounds: Normal heart sounds.  Pulmonary:     Effort: Pulmonary effort is normal.     Breath sounds: Normal breath sounds.  Abdominal:     General: There is no distension.     Palpations: Abdomen is soft. There is no mass.  Musculoskeletal:        General: No swelling.  Skin:    General: Skin is warm.  Neurological:     General: No focal deficit present.     Mental Status: He is alert and oriented to person, place, and time.  Psychiatric:        Mood and Affect: Mood normal.        Behavior: Behavior normal.      LABORATORY DATA:  I have reviewed the labs as listed.  CBC    Component Value Date/Time   WBC 3.9 (L) 10/15/2018 1112   RBC 5.11 10/15/2018 1112   HGB 12.3 (L) 10/15/2018 1112   HCT 39.9 10/15/2018 1112   PLT 109 (L)  10/15/2018 1112   MCV 78.1 (L) 10/15/2018 1112   MCH 24.1 (L) 10/15/2018 1112   MCHC 30.8 10/15/2018 1112   RDW 16.4 (H) 10/15/2018 1112   LYMPHSABS 1.9 10/15/2018 1112   MONOABS 0.3 10/15/2018 1112   EOSABS 0.1 10/15/2018 1112   BASOSABS 0.0 10/15/2018 1112   CMP Latest Ref Rng & Units 10/15/2018 06/17/2018 03/17/2018  Glucose 70 - 99 mg/dL 95 117(H) 93  BUN 6 - 20 mg/dL 12 16 14   Creatinine 0.61 - 1.24 mg/dL 0.84 0.88 0.93  Sodium 135 - 145 mmol/L 141 141 140  Potassium 3.5 - 5.1 mmol/L 3.7 3.5 3.3(L)  Chloride 98 - 111 mmol/L 109 108 106  CO2 22 - 32 mmol/L 25 25 27   Calcium 8.9 - 10.3 mg/dL 8.1(L) 8.6(L) 9.0  Total Protein 6.5 - 8.1 g/dL 7.0 7.4 7.5  Total Bilirubin 0.3 - 1.2 mg/dL 1.2 0.9 1.3(H)  Alkaline Phos 38 - 126 U/L 46 46 43  AST 15 - 41 U/L 20 20 17   ALT 0 - 44 U/L 14 12 12        DIAGNOSTIC IMAGING:  I have independently reviewed the scans and discussed with the patient.   I have reviewed Venita Lick LPN's note and agree with the documentation.  I personally performed a face-to-face visit, made revisions and my assessment and plan is as follows.    ASSESSMENT & PLAN:   Multiple myeloma in remission ([place]) 1.  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  He was on Revlimid maintenance until 2015.  Zometa was also discontinued prior to that. - Skeletal survey on 06/19/2017 showed stable lytic lesions in the axial and appendicular skeleton.  No new lesions.  - No new pains were reported.  Denies any fevers, night sweats or weight loss in the last 3 months.  Denies any hospitalizations or infections.  - We reviewed  his myeloma panel from 10/15/2018.  They were within normal limits.  CBC showed, globin 12.3 and platelet count of 109. -We will see him back in 4 months for follow-up with repeat blood work.  2.  Chronic mid back pain: -This is  from prior vertebral fracture. -He will continue methadone 10 mg every 12 hours which is controlling the pain.       Orders placed this encounter:  Orders Placed This Encounter  Procedures  . CBC with Differential/Platelet  . Comprehensive metabolic panel  . Protein electrophoresis, serum  . Kappa/lambda light chains  . Lactate dehydrogenase  . Immunofixation electrophoresis      Derek Jack, MD [place] [phone]

## 2018-10-22 NOTE — Assessment & Plan Note (Signed)
1.  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  He was on Revlimid maintenance until 2015.  Zometa was also discontinued prior to that. - Skeletal survey on 06/19/2017 showed stable lytic lesions in the axial and appendicular skeleton.  No new lesions.  - No new pains were reported.  Denies any fevers, night sweats or weight loss in the last 3 months.  Denies any hospitalizations or infections.  - We reviewed his myeloma panel from 10/15/2018.  They were within normal limits.  CBC showed, globin 12.3 and platelet count of 109. -We will see him back in 4 months for follow-up with repeat blood work.  2.  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 -He will continue methadone 10 mg every 12 hours which is controlling the pain.

## 2018-10-22 NOTE — Patient Instructions (Signed)
[facility] at [facility] Discharge Instructions  You were seen today by Dr. Katragadda. He went over your recent lab results. He will see you back in 4 months for labs and follow up.   Thank you for choosing  Can[facility] Center at [facility] to provide your oncology and hematology care.  To afford each patient quality time with our provider, please arrive at least 15 minutes before your scheduled appointment time.   If you have a lab appointment with the Cancer Center please come in thru the  Main Entrance and check in at the main information desk  You need to re-schedule your appointment should you arrive 10 or more minutes late.  We strive to give you quality time with our providers, and arriving late affects you and other patients whose appointments are after yours.  Also, if you no show three or more times for appointments you may be dismissed from the clinic at the providers discretion.     Again, thank you for choosing [facility].  Our hope is that these requests will decrease the amount of time that you wait before being seen by our physicians.       _____________________________________________________________  Should you have questions after your visit to [facility], please contact our office at [phone] between the hours of 8:00 a.m. and 4:30 p.m.  Voicemails left after 4:00 p.m. will not be returned until the following business day.  For prescription refill requests, have your pharmacy contact our office and allow 72 hours.    Cancer Center Support Programs:   > Cancer Support Group  2nd Tuesday of the month 1pm-2pm, Journey Room    

## 2018-10-26 ENCOUNTER — Other Ambulatory Visit (HOSPITAL_COMMUNITY): Payer: Self-pay | Admitting: *Deleted

## 2018-10-26 ENCOUNTER — Telehealth (HOSPITAL_COMMUNITY): Payer: Self-pay | Admitting: *Deleted

## 2018-10-26 DIAGNOSIS — C9001 Multiple myeloma in remission: Secondary | ICD-10-CM

## 2018-10-26 DIAGNOSIS — G8929 Other chronic pain: Secondary | ICD-10-CM

## 2018-10-26 MED ORDER — METHADONE HCL 10 MG PO TABS: 10.0000 mg | ORAL_TABLET | Freq: Two times a day (BID) | ORAL | 0 refills | Status: DC

## 2018-10-26 NOTE — Telephone Encounter (Signed)
Pt called into clinic needing a refill on methadone 10 mg routed request to provider. Provider sent medication to [facility] pharmacy on file. Called pt to let him know prescription was sent to pharmacy to be refilled.

## 2018-11-25 ENCOUNTER — Other Ambulatory Visit (HOSPITAL_COMMUNITY): Payer: Self-pay | Admitting: Emergency Medicine

## 2018-11-25 DIAGNOSIS — G8929 Other chronic pain: Secondary | ICD-10-CM

## 2018-11-25 DIAGNOSIS — M546 Pain in thoracic spine: Secondary | ICD-10-CM

## 2018-11-25 DIAGNOSIS — C9001 Multiple myeloma in remission: Secondary | ICD-10-CM

## 2018-11-26 ENCOUNTER — Other Ambulatory Visit (HOSPITAL_COMMUNITY): Payer: Self-pay | Admitting: Nurse Practitioner

## 2018-11-26 DIAGNOSIS — G8929 Other chronic pain: Secondary | ICD-10-CM

## 2018-11-26 DIAGNOSIS — C9001 Multiple myeloma in remission: Secondary | ICD-10-CM

## 2018-11-26 MED ORDER — METHADONE HCL 10 MG PO TABS: 10.0000 mg | ORAL_TABLET | Freq: Two times a day (BID) | ORAL | 0 refills | Status: DC

## 2018-12-21 ENCOUNTER — Other Ambulatory Visit (HOSPITAL_COMMUNITY): Payer: Self-pay | Admitting: Nurse Practitioner

## 2018-12-21 DIAGNOSIS — C9001 Multiple myeloma in remission: Secondary | ICD-10-CM

## 2018-12-24 ENCOUNTER — Other Ambulatory Visit (HOSPITAL_COMMUNITY): Payer: Self-pay | Admitting: Nurse Practitioner

## 2018-12-24 DIAGNOSIS — C9001 Multiple myeloma in remission: Secondary | ICD-10-CM

## 2018-12-24 DIAGNOSIS — M546 Pain in thoracic spine: Secondary | ICD-10-CM

## 2018-12-24 DIAGNOSIS — G8929 Other chronic pain: Secondary | ICD-10-CM

## 2018-12-24 MED ORDER — METHADONE HCL 10 MG PO TABS: 10.0000 mg | ORAL_TABLET | Freq: Two times a day (BID) | ORAL | 0 refills | Status: DC

## 2019-01-26 ENCOUNTER — Other Ambulatory Visit (HOSPITAL_COMMUNITY): Payer: Self-pay | Admitting: *Deleted

## 2019-01-26 DIAGNOSIS — C9001 Multiple myeloma in remission: Secondary | ICD-10-CM

## 2019-01-26 DIAGNOSIS — G8929 Other chronic pain: Secondary | ICD-10-CM

## 2019-01-26 MED ORDER — METHADONE HCL 10 MG PO TABS: 10.0000 mg | ORAL_TABLET | Freq: Two times a day (BID) | ORAL | 0 refills | Status: DC

## 2019-02-22 ENCOUNTER — Inpatient Hospital Stay (HOSPITAL_COMMUNITY): Payer: Medicare Other | Attending: Hematology

## 2019-02-22 ENCOUNTER — Other Ambulatory Visit: Payer: Self-pay

## 2019-02-22 ENCOUNTER — Encounter (HOSPITAL_COMMUNITY): Payer: Self-pay

## 2019-02-22 DIAGNOSIS — Z452 Encounter for adjustment and management of vascular access device: Secondary | ICD-10-CM | POA: Diagnosis not present

## 2019-02-22 DIAGNOSIS — C9001 Multiple myeloma in remission: Secondary | ICD-10-CM | POA: Diagnosis not present

## 2019-02-22 LAB — CBC WITH DIFFERENTIAL/PLATELET
Abs Immature Granulocytes: 0.01 10*3/uL (ref 0.00–0.07)
Basophils Absolute: 0 10*3/uL (ref 0.0–0.1)
Basophils Relative: 0 %
Eosinophils Absolute: 0 10*3/uL (ref 0.0–0.5)
Eosinophils Relative: 0 %
HCT: 38.3 % — ABNORMAL LOW (ref 39.0–52.0)
Hemoglobin: 11.7 g/dL — ABNORMAL LOW (ref 13.0–17.0)
Immature Granulocytes: 0 %
Lymphocytes Relative: 23 %
Lymphs Abs: 1.1 10*3/uL (ref 0.7–4.0)
MCH: 24.1 pg — ABNORMAL LOW (ref 26.0–34.0)
MCHC: 30.5 g/dL (ref 30.0–36.0)
MCV: 78.8 fL — ABNORMAL LOW (ref 80.0–100.0)
Monocytes Absolute: 0.2 10*3/uL (ref 0.1–1.0)
Monocytes Relative: 5 %
Neutro Abs: 3.3 10*3/uL (ref 1.7–7.7)
Neutrophils Relative %: 72 %
Platelets: 100 10*3/uL — ABNORMAL LOW (ref 150–400)
RBC: 4.86 MIL/uL (ref 4.22–5.81)
RDW: 16.6 % — ABNORMAL HIGH (ref 11.5–15.5)
WBC: 4.6 10*3/uL (ref 4.0–10.5)
nRBC: 0 % (ref 0.0–0.2)

## 2019-02-22 LAB — LACTATE DEHYDROGENASE: LDH: 153 U/L (ref 98–192)

## 2019-02-22 LAB — COMPREHENSIVE METABOLIC PANEL
ALT: 13 U/L (ref 0–44)
AST: 20 U/L (ref 15–41)
Albumin: 4.3 g/dL (ref 3.5–5.0)
Alkaline Phosphatase: 47 U/L (ref 38–126)
Anion gap: 10 (ref 5–15)
BUN: 11 mg/dL (ref 6–20)
CO2: 25 mmol/L (ref 22–32)
Calcium: 8.5 mg/dL — ABNORMAL LOW (ref 8.9–10.3)
Chloride: 106 mmol/L (ref 98–111)
Creatinine, Ser: 0.83 mg/dL (ref 0.61–1.24)
GFR calc Af Amer: >60 mL/min (ref >60)
GFR calc non Af Amer: >60 mL/min (ref >60)
Glucose, Bld: 116 mg/dL — ABNORMAL HIGH (ref 70–99)
Potassium: 3.5 mmol/L (ref 3.5–5.1)
Sodium: 141 mmol/L (ref 135–145)
Total Bilirubin: 1.7 mg/dL — ABNORMAL HIGH (ref 0.3–1.2)
Total Protein: 7.5 g/dL (ref 6.5–8.1)

## 2019-02-22 MED ORDER — HEPARIN SOD (PORK) LOCK FLUSH 100 UNIT/ML IV SOLN
500.0000 [IU] | Freq: Once | INTRAVENOUS | Status: AC
  Administered 2019-02-22: 500 [IU] via INTRAVENOUS

## 2019-02-22 MED ORDER — SODIUM CHLORIDE 0.9% FLUSH
20.0000 mL | INTRAVENOUS | Status: DC | PRN
  Administered 2019-02-22: 20 mL via INTRAVENOUS
  Filled 2019-02-22: qty 20

## 2019-02-22 NOTE — Progress Notes (Signed)
Steven [name] tolerated port lab draw with flush well without complaints or incident. VSS Pt discharged self ambulatory in satisfactory condition

## 2019-02-22 NOTE — Patient Instructions (Signed)
[facility] at [facility] Discharge Instructions  Labs drawn from portacath today. Follow-up as scheduled. Call clinic for any questions or concerns   Thank you for choosing [facility] at [facility] to provide your oncology and hematology care.  To afford each patient quality time with our provider, please arrive at least 15 minutes before your scheduled appointment time.   If you have a lab appointment with the Cancer Center please come in thru the Main Entrance and check in at the main information desk.  You need to re-schedule your appointment should you arrive 10 or more minutes late.  We strive to give you quality time with our providers, and arriving late affects you and other patients whose appointments are after yours.  Also, if you no show three or more times for appointments you may be dismissed from the clinic at the providers discretion.     Again, thank you for choosing [facility].  Our hope is that these requests will decrease the amount of time that you wait before being seen by our physicians.       _____________________________________________________________  Should you have questions after your visit to [facility], please contact our office at [phone] between the hours of 8:00 a.m. and 4:30 p.m.  Voicemails left after 4:00 p.m. will not be returned until the following business day.  For prescription refill requests, have your pharmacy contact our office and allow 72 hours.    Due to Covid, you will need to wear a mask upon entering the hospital. If you do not have a mask, a mask will be given to you at the Main Entrance upon arrival. For doctor visits, patients may have 1 support person with them. For treatment visits, patients can not have anyone with them due to social distancing guidelines and our immunocompromised population.     

## 2019-02-23 LAB — PROTEIN ELECTROPHORESIS, SERUM
A/G Ratio: 1.4 (ref 0.7–1.7)
Albumin ELP: 4.2 g/dL (ref 2.9–4.4)
Alpha-1-Globulin: 0.2 g/dL (ref 0.0–0.4)
Alpha-2-Globulin: 0.7 g/dL (ref 0.4–1.0)
Beta Globulin: 1 g/dL (ref 0.7–1.3)
Gamma Globulin: 1.2 g/dL (ref 0.4–1.8)
Globulin, Total: 3 g/dL (ref 2.2–3.9)
Total Protein ELP: 7.2 g/dL (ref 6.0–8.5)

## 2019-02-23 LAB — KAPPA/LAMBDA LIGHT CHAINS
Kappa free light chain: 22.3 mg/L — ABNORMAL HIGH (ref 3.3–19.4)
Kappa, lambda light chain ratio: 2.08 — ABNORMAL HIGH (ref 0.26–1.65)
Lambda free light chains: 10.7 mg/L (ref 5.7–26.3)

## 2019-02-23 LAB — IMMUNOFIXATION ELECTROPHORESIS
IgA: 293 mg/dL (ref 90–386)
IgG (Immunoglobin G), Serum: 1139 mg/dL (ref 603–1613)
IgM (Immunoglobulin M), Srm: 83 mg/dL (ref 20–172)
Total Protein ELP: 7.1 g/dL (ref 6.0–8.5)

## 2019-02-24 ENCOUNTER — Other Ambulatory Visit (HOSPITAL_COMMUNITY): Payer: Self-pay | Admitting: *Deleted

## 2019-02-24 DIAGNOSIS — G8929 Other chronic pain: Secondary | ICD-10-CM

## 2019-02-24 DIAGNOSIS — C9001 Multiple myeloma in remission: Secondary | ICD-10-CM

## 2019-02-25 MED ORDER — METHADONE HCL 10 MG PO TABS: 10.0000 mg | ORAL_TABLET | Freq: Two times a day (BID) | ORAL | 0 refills | Status: DC

## 2019-03-01 ENCOUNTER — Other Ambulatory Visit: Payer: Self-pay

## 2019-03-01 ENCOUNTER — Encounter (HOSPITAL_COMMUNITY): Payer: Self-pay | Admitting: Hematology

## 2019-03-01 ENCOUNTER — Inpatient Hospital Stay (HOSPITAL_BASED_OUTPATIENT_CLINIC_OR_DEPARTMENT_OTHER): Payer: Medicare Other | Admitting: Hematology

## 2019-03-01 VITALS — BP 149/95 | HR 76 | Temp 97.3°F | Resp 16 | Wt 170.0 lb

## 2019-03-01 DIAGNOSIS — C9001 Multiple myeloma in remission: Secondary | ICD-10-CM | POA: Diagnosis not present

## 2019-03-01 MED ORDER — AMOXICILLIN-POT CLAVULANATE 875-125 MG PO TABS: 1.0000 | ORAL_TABLET | Freq: Two times a day (BID) | ORAL | 0 refills | Status: DC

## 2019-03-01 NOTE — Progress Notes (Signed)
[place], [place] [address]   CLINIC:  Medical Oncology/Hematology  PCP:  Monico Blitz, [place] [address] [phone]   REASON FOR VISIT:  Follow-up for multiple myeloma  CURRENT [place]   BRIEF ONCOLOGIC HISTORY:  Oncology History  Multiple myeloma in remission ([place])  06/05/2003 Initial Diagnosis   Myeloma, presenting with excruciating back pain and T12 vertebral body fracture, kappa light chain multiple myeloma diagnosed with 2030 mg light chains per 24 hours and urine   06/09/2003 - 06/22/2003 Radiation Therapy   19 Gray to T11-L1 or T12 vertebral body fracture. Kermit T2-T4 for T3 vertebral body involvement   06/26/2003 - 07/25/2004 Chemotherapy   Doxil, Velcade, and dexamethasone    02/27/2004 Bone Marrow Biopsy   Normal cellular bone marrow showing trilineage hematopoiesis with less than 10% plasma cells.   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  Followed by [facility] Department of dentistry.   06/22/2008 Progression   Relapse with The light chains in 24-hour urine found, therapy reinitiated with Revlimid/dexamethasone.   07/31/2008 - 08/10/2013 Chemotherapy   Revlimid/dexamethasone initiated for recurrent disease.    08/27/2010 - 09/10/2010 Radiation Therapy   2500 cGy to right hip for impending right femoral neck fracture   08/04/2013 Imaging   Bone density-normal with T score of -1.0.   01/19/2014 Imaging   MRI pelvis-partially visualized right L4-L5 and L5-S1 facet arthritis with periarticular edema. This can be associated with referred pain to the right hip. Small abnormal marrow signal foci in the proximal right femur are likely representing treated myeloma. There is no cortical erosion, stress reaction or  stress fracture. Similar lesions are present in the right supra-acetabular ileum.   02/08/2014 Imaging   Bone survey-no significant change in widespread myelomatous involvement of skeleton. Multiple pathologic fractures in the thoracic spine at L1 appears stable. No interval pathologic fracture identified.   12/26/2015 Tumor Marker   M-spike NOT OBSERVED; normal IgG, IgM, IgA, & kappa/lambda light chains.     02/26/2016 Treatment Plan Change   Transfer of medical oncology care to [facility]   02/26/2016 Tumor Marker   M-spike NOT OBSERVED; normal IgG, IgM, IgA, & kappa/lambda light chains.    03/22/2016 Imaging   Skeletal survey imaging: IMPRESSION: Stable appearance of widespread myelomatous involvement of the skeleton. No progressive lesions are observed. Stable appearing partial compressions of thoracic vertebral bodies. Stable vertebra plana at T12.   05/29/2016 Tumor Marker   M-spike NOT OBSERVED; normal IgG, IgM, IgA, & kappa/lambda light chains.       CANCER STAGING: Cancer Staging No matching staging information was found for the patient.   INTERVAL HISTORY:  Steven [name] 58 y.o. male seen for follow-up of multiple myeloma.  Appetite and energy levels are 100%.  Reports upper incisor pain for the last 2 to 3 days.  Denies any fevers or chills.  Denies any new onset pains.  Back pain is well controlled with methadone.  Denies any fevers, night sweats or weight loss.  No recent ER visits or hospitalizations.  Denies any nausea vomiting diarrhea or constipation.    REVIEW OF SYSTEMS:  Review of Systems  Musculoskeletal: Positive for back pain.     PAST MEDICAL/SURGICAL HISTORY:  Past Medical History:  Diagnosis Date  .  Multiple myeloma in remission ([place]) 11/27/2015   History reviewed. No pertinent surgical history.   SOCIAL HISTORY:  Social History   Socioeconomic History  . Marital status: Married    Spouse name: Not on file  . Number of children:  Not on file  . Years of education: Not on file  . Highest education level: Not on file  Occupational History  . Not on file  Social Needs  . Financial resource strain: Not on file  . Food insecurity    Worry: Not on file    Inability: Not on file  . Transportation needs    Medical: Not on file    Non-medical: Not on file  Tobacco Use  . Smoking status: Never Smoker  . Smokeless tobacco: Never Used  Substance and Sexual Activity  . Alcohol use: No  . Drug use: No  . Sexual activity: Not on file  Lifestyle  . Physical activity    Days per week: Not on file    Minutes per session: Not on file  . Stress: Not on file  Relationships  . Social Herbalist on phone: Not on file    Gets together: Not on file    Attends religious service: Not on file    Active member of club or organization: Not on file    Attends meetings of clubs or organizations: Not on file    Relationship status: Not on file  . Intimate partner violence    Fear of current or ex partner: Not on file    Emotionally abused: Not on file    Physically abused: Not on file    Forced sexual activity: Not on file  Other Topics Concern  . Not on file  Social History Narrative  . Not on file    FAMILY HISTORY:  Family History  Problem Relation Age of Onset  . Diabetes Mother   . Cancer Sister     CURRENT MEDICATIONS:  Outpatient Encounter Medications as of 03/01/2019  Medication Sig Note  . aspirin (GOODSENSE ASPIRIN) 325 MG tablet Take 325 mg by mouth once a week.  12/26/2015: Received from: [facility]  . calcium citrate-vitamin D (CITRACAL+D) 315-200 MG-UNIT tablet Take 1 tablet by mouth daily.  12/26/2015: Received from: [facility]  . chlorhexidine (PERIDEX) 0.12 % solution USE AS DIRECTED 15 MLS IN THE MOUTH OR THROAT 2 (TWO) TIMES DAILY.   . folic acid (FOLVITE) 1 MG tablet Take 1 mg by mouth. 12/26/2015: Received from: [facility]  . KLOR-CON M20 20 MEQ tablet TAKE 1 TABLET BY MOUTH  EVERY DAY   . lidocaine-prilocaine (EMLA) cream Apply 1 application topically as needed.   . methadone (DOLOPHINE) 10 MG tablet Take 1 tablet (10 mg total) by mouth every 12 (twelve) hours.   . polyethylene glycol (MIRALAX / GLYCOLAX) packet Take 17 g by mouth daily as needed.  12/26/2015: Received from: [facility]  . Pramox-PE-Glycerin-Petrolatum (PREPARATION H) 1-0.25-14.4-15 % CREA Place rectally.   Marland Kitchen amoxicillin-clavulanate (AUGMENTIN) 875-125 MG tablet Take 1 tablet by mouth 2 (two) times daily.    Facility-Administered Encounter Medications as of 03/01/2019  Medication  . sodium chloride flush (NS) 0.9 % injection 10 mL    ALLERGIES:  No Known Allergies   PHYSICAL EXAM:  ECOG Performance status: 1  Vitals:   03/01/19 1000  BP: (!) 149/95  Pulse: 76  Resp: 16  Temp: (!) 97.3 F (36.3 C)  SpO2: 98%   Filed  Weights   03/01/19 1000  Weight: 170 lb (77.1 kg)    Physical Exam Vitals signs reviewed.  Constitutional:      Appearance: Normal appearance.  Cardiovascular:     Rate and Rhythm: Normal rate and regular rhythm.     Heart sounds: Normal heart sounds.  Pulmonary:     Effort: Pulmonary effort is normal.     Breath sounds: Normal breath sounds.  Abdominal:     General: There is no distension.     Palpations: Abdomen is soft. There is no mass.  Musculoskeletal:        General: No swelling.  Skin:    General: Skin is warm.  Neurological:     General: No focal deficit present.     Mental Status: He is alert and oriented to person, place, and time.  Psychiatric:        Mood and Affect: Mood normal.        Behavior: Behavior normal.      LABORATORY DATA:  I have reviewed the labs as listed.  CBC    Component Value Date/Time   WBC 4.6 02/22/2019 1112   RBC 4.86 02/22/2019 1112   HGB 11.7 (L) 02/22/2019 1112   HCT 38.3 (L) 02/22/2019 1112   PLT 100 (L) 02/22/2019 1112   MCV 78.8 (L) 02/22/2019 1112   MCH 24.1 (L) 02/22/2019 1112   MCHC 30.5  02/22/2019 1112   RDW 16.6 (H) 02/22/2019 1112   LYMPHSABS 1.1 02/22/2019 1112   MONOABS 0.2 02/22/2019 1112   EOSABS 0.0 02/22/2019 1112   BASOSABS 0.0 02/22/2019 1112   CMP Latest Ref Rng & Units 02/22/2019 10/15/2018 06/17/2018  Glucose 70 - 99 mg/dL 116(H) 95 117(H)  BUN 6 - 20 mg/dL 11 12 16   Creatinine 0.61 - 1.24 mg/dL 0.83 0.84 0.88  Sodium 135 - 145 mmol/L 141 141 141  Potassium 3.5 - 5.1 mmol/L 3.5 3.7 3.5  Chloride 98 - 111 mmol/L 106 109 108  CO2 22 - 32 mmol/L 25 25 25   Calcium 8.9 - 10.3 mg/dL 8.5(L) 8.1(L) 8.6(L)  Total Protein 6.5 - 8.1 g/dL 7.5 7.0 7.4  Total Bilirubin 0.3 - 1.2 mg/dL 1.7(H) 1.2 0.9  Alkaline Phos 38 - 126 U/L 47 46 46  AST 15 - 41 U/L 20 20 20   ALT 0 - 44 U/L 13 14 12        DIAGNOSTIC IMAGING:  I have independently reviewed the scans and discussed with the patient.   I have reviewed Venita Lick LPN's note and agree with the documentation.  I personally performed a face-to-face visit, made revisions and my assessment and plan is as follows.    ASSESSMENT & PLAN:   Multiple myeloma in remission ([place]) 1.  Multiple myeloma: -Presentation with back pain with T12 vertebral fracture resulting in mid back pain, status post kyphoplasty. 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  Revlimid maintenance until 2015.  Zometa discontinued prior to that. - Last skeletal survey on 06/19/2017 showed stable lytic lesions in the axial and appendicular skeleton.  No new lesions. - He denies any fevers, night sweats or weight loss in the last 4 months.  Denies any new bone pains. -We reviewed myeloma panel from 02/22/2019.  SPEP and immunofixation were normal.  Free light chain ratio was 2.08.  This was slightly higher than before.  Creatinine, calcium and hemoglobin are stable. -I have recommended follow-up in 4 months with repeat blood work.  2.  Chronic mid back  pain: -This is from prior vertebral fracture. -He takes methadone 10 mg every 12 hours which is  controlling pain.  3.  Dental abscess: - He reports pain in the upper incisor.  I have given prescription for Augmentin twice daily for 5 days.  He will call his dentist at [facility].   Total time spent is 25 minutes with more than 50% of the time spent face-to-face discussing lab results, surveillance plan, counseling and coordination of care.  Orders placed this encounter:  Orders Placed This Encounter  Procedures  . CBC with Differential/Platelet  . Comprehensive metabolic panel  . Protein electrophoresis, serum  . Kappa/lambda light chains  . Lactate dehydrogenase  . Immunofixation electrophoresis      Derek Jack, MD [place] [phone]

## 2019-03-01 NOTE — Patient Instructions (Addendum)
[place] at [facility] Discharge Instructions  You were seen today by Dr. Delton Coombes. He went over your recent lab results. He will send in an antibiotic to your pharmacy. He will see you back in 4 months for labs and follow up.   Thank you for choosing [place] at [facility] to provide your oncology and hematology care.  To afford each patient quality time with our provider, please arrive at least 15 minutes before your scheduled appointment time.   If you have a lab appointment with the [facility] please come in thru the  Main Entrance and check in at the main information desk  You need to re-schedule your appointment should you arrive 10 or more minutes late.  We strive to give you quality time with our providers, and arriving late affects you and other patients whose appointments are after yours.  Also, if you no show three or more times for appointments you may be dismissed from the clinic at the providers discretion.     Again, thank you for choosing [facility].  Our hope is that these requests will decrease the amount of time that you wait before being seen by our physicians.       _____________________________________________________________  Should you have questions after your visit to [facility], please contact our office at [phone] between the hours of 8:00 a.m. and 4:30 p.m.  Voicemails left after 4:00 p.m. will not be returned until the following business day.  For prescription refill requests, have your pharmacy contact our office and allow 72 hours.    Cancer Center Support Programs:   > Cancer Support Group  2nd Tuesday of the month 1pm-2pm, Journey Room

## 2019-03-01 NOTE — Assessment & Plan Note (Signed)
1.  Multiple myeloma: -Presentation with back pain with T12 vertebral fracture resulting in mid back pain, status post kyphoplasty. 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  Revlimid maintenance until 2015.  Zometa discontinued prior to that. - Last skeletal survey on 06/19/2017 showed stable lytic lesions in the axial and appendicular skeleton.  No new lesions. - He denies any fevers, night sweats or weight loss in the last 4 months.  Denies any new bone pains. -We reviewed myeloma panel from 02/22/2019.  SPEP and immunofixation were normal.  Free light chain ratio was 2.08.  This was slightly higher than before.  Creatinine, calcium and hemoglobin are stable. -I have recommended follow-up in 4 months with repeat blood work.  2.  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 -He takes methadone 10 mg every 12 hours which is controlling pain.  3.  Dental abscess: - He reports pain in the upper incisor.  I have given prescription for Augmentin twice daily for 5 days.  He will call his dentist at [facility].

## 2019-03-28 ENCOUNTER — Other Ambulatory Visit (HOSPITAL_COMMUNITY): Payer: Self-pay | Admitting: *Deleted

## 2019-03-28 DIAGNOSIS — G8929 Other chronic pain: Secondary | ICD-10-CM

## 2019-03-28 DIAGNOSIS — C9001 Multiple myeloma in remission: Secondary | ICD-10-CM

## 2019-03-28 MED ORDER — METHADONE HCL 10 MG PO TABS: 10.0000 mg | ORAL_TABLET | Freq: Two times a day (BID) | ORAL | 0 refills | Status: DC

## 2019-04-26 ENCOUNTER — Other Ambulatory Visit (HOSPITAL_COMMUNITY): Payer: Self-pay | Admitting: Emergency Medicine

## 2019-04-26 DIAGNOSIS — C9001 Multiple myeloma in remission: Secondary | ICD-10-CM

## 2019-04-26 DIAGNOSIS — G8929 Other chronic pain: Secondary | ICD-10-CM

## 2019-04-26 DIAGNOSIS — M546 Pain in thoracic spine: Secondary | ICD-10-CM

## 2019-04-26 MED ORDER — METHADONE HCL 10 MG PO TABS: 10.0000 mg | ORAL_TABLET | Freq: Two times a day (BID) | ORAL | 0 refills | Status: DC

## 2019-05-26 ENCOUNTER — Other Ambulatory Visit (HOSPITAL_COMMUNITY): Payer: Self-pay | Admitting: Surgery

## 2019-05-26 DIAGNOSIS — G8929 Other chronic pain: Secondary | ICD-10-CM

## 2019-05-26 DIAGNOSIS — C9001 Multiple myeloma in remission: Secondary | ICD-10-CM

## 2019-05-26 DIAGNOSIS — M546 Pain in thoracic spine: Secondary | ICD-10-CM

## 2019-05-26 MED ORDER — METHADONE HCL 10 MG PO TABS: 10.0000 mg | ORAL_TABLET | Freq: Two times a day (BID) | ORAL | 0 refills | Status: DC

## 2019-06-19 ENCOUNTER — Other Ambulatory Visit (HOSPITAL_COMMUNITY): Payer: Self-pay | Admitting: Nurse Practitioner

## 2019-06-19 DIAGNOSIS — C9001 Multiple myeloma in remission: Secondary | ICD-10-CM

## 2019-06-24 ENCOUNTER — Other Ambulatory Visit (HOSPITAL_COMMUNITY): Payer: Self-pay | Admitting: *Deleted

## 2019-06-24 DIAGNOSIS — C9001 Multiple myeloma in remission: Secondary | ICD-10-CM

## 2019-06-24 DIAGNOSIS — G8929 Other chronic pain: Secondary | ICD-10-CM

## 2019-06-24 DIAGNOSIS — M546 Pain in thoracic spine: Secondary | ICD-10-CM

## 2019-06-24 MED ORDER — METHADONE HCL 10 MG PO TABS: 10.0000 mg | ORAL_TABLET | Freq: Two times a day (BID) | ORAL | 0 refills | Status: DC

## 2019-06-28 ENCOUNTER — Inpatient Hospital Stay (HOSPITAL_COMMUNITY): Payer: Medicare Other

## 2019-07-04 ENCOUNTER — Inpatient Hospital Stay (HOSPITAL_COMMUNITY): Payer: Medicare Other | Attending: Hematology

## 2019-07-04 ENCOUNTER — Encounter (HOSPITAL_COMMUNITY): Payer: Self-pay

## 2019-07-04 ENCOUNTER — Other Ambulatory Visit: Payer: Self-pay

## 2019-07-04 DIAGNOSIS — M549 Dorsalgia, unspecified: Secondary | ICD-10-CM | POA: Diagnosis not present

## 2019-07-04 DIAGNOSIS — C9001 Multiple myeloma in remission: Secondary | ICD-10-CM | POA: Diagnosis not present

## 2019-07-04 DIAGNOSIS — K047 Periapical abscess without sinus: Secondary | ICD-10-CM | POA: Diagnosis not present

## 2019-07-04 DIAGNOSIS — G8929 Other chronic pain: Secondary | ICD-10-CM | POA: Diagnosis not present

## 2019-07-04 LAB — COMPREHENSIVE METABOLIC PANEL
ALT: 15 U/L (ref 0–44)
AST: 19 U/L (ref 15–41)
Albumin: 4.3 g/dL (ref 3.5–5.0)
Alkaline Phosphatase: 47 U/L (ref 38–126)
Anion gap: 7 (ref 5–15)
BUN: 12 mg/dL (ref 6–20)
CO2: 25 mmol/L (ref 22–32)
Calcium: 8.4 mg/dL — ABNORMAL LOW (ref 8.9–10.3)
Chloride: 107 mmol/L (ref 98–111)
Creatinine, Ser: 0.91 mg/dL (ref 0.61–1.24)
GFR calc Af Amer: >60 mL/min (ref >60)
GFR calc non Af Amer: >60 mL/min (ref >60)
Glucose, Bld: 97 mg/dL (ref 70–99)
Potassium: 3.7 mmol/L (ref 3.5–5.1)
Sodium: 139 mmol/L (ref 135–145)
Total Bilirubin: 1.2 mg/dL (ref 0.3–1.2)
Total Protein: 7.4 g/dL (ref 6.5–8.1)

## 2019-07-04 LAB — CBC WITH DIFFERENTIAL/PLATELET
Abs Immature Granulocytes: 0 10*3/uL (ref 0.00–0.07)
Basophils Absolute: 0 10*3/uL (ref 0.0–0.1)
Basophils Relative: 1 %
Eosinophils Absolute: 0.1 10*3/uL (ref 0.0–0.5)
Eosinophils Relative: 2 %
HCT: 42.4 % (ref 39.0–52.0)
Hemoglobin: 12.9 g/dL — ABNORMAL LOW (ref 13.0–17.0)
Immature Granulocytes: 0 %
Lymphocytes Relative: 52 %
Lymphs Abs: 2.1 10*3/uL (ref 0.7–4.0)
MCH: 23.9 pg — ABNORMAL LOW (ref 26.0–34.0)
MCHC: 30.4 g/dL (ref 30.0–36.0)
MCV: 78.7 fL — ABNORMAL LOW (ref 80.0–100.0)
Monocytes Absolute: 0.4 10*3/uL (ref 0.1–1.0)
Monocytes Relative: 9 %
Neutro Abs: 1.5 10*3/uL — ABNORMAL LOW (ref 1.7–7.7)
Neutrophils Relative %: 36 %
Platelets: 102 10*3/uL — ABNORMAL LOW (ref 150–400)
RBC: 5.39 MIL/uL (ref 4.22–5.81)
RDW: 16.1 % — ABNORMAL HIGH (ref 11.5–15.5)
WBC: 4 10*3/uL (ref 4.0–10.5)
nRBC: 0 % (ref 0.0–0.2)

## 2019-07-04 LAB — LACTATE DEHYDROGENASE: LDH: 134 U/L (ref 98–192)

## 2019-07-04 MED ORDER — SODIUM CHLORIDE 0.9% FLUSH
20.0000 mL | INTRAVENOUS | Status: DC | PRN
  Administered 2019-07-04: 20 mL via INTRAVENOUS

## 2019-07-04 MED ORDER — HEPARIN SOD (PORK) LOCK FLUSH 100 UNIT/ML IV SOLN
500.0000 [IU] | Freq: Once | INTRAVENOUS | Status: AC
  Administered 2019-07-04: 14:00:00 500 [IU] via INTRAVENOUS

## 2019-07-04 NOTE — Patient Instructions (Signed)
[facility] at [facility] Discharge Instructions  Labs drawn from portacath today. Follow-up as scheduled. Call clinic for any questions or concerns   Thank you for choosing [facility] at [facility] to provide your oncology and hematology care.  To afford each patient quality time with our provider, please arrive at least 15 minutes before your scheduled appointment time.   If you have a lab appointment with the Cancer Center please come in thru the Main Entrance and check in at the main information desk.  You need to re-schedule your appointment should you arrive 10 or more minutes late.  We strive to give you quality time with our providers, and arriving late affects you and other patients whose appointments are after yours.  Also, if you no show three or more times for appointments you may be dismissed from the clinic at the providers discretion.     Again, thank you for choosing [facility].  Our hope is that these requests will decrease the amount of time that you wait before being seen by our physicians.       _____________________________________________________________  Should you have questions after your visit to  Cancer Center, please contact our office at [phone] between the hours of 8:00 a.m. and 4:30 p.m.  Voicemails left after 4:00 p.m. will not be returned until the following business day.  For prescription refill requests, have your pharmacy contact our office and allow 72 hours.    Due to Covid, you will need to wear a mask upon entering the hospital. If you do not have a mask, a mask will be given to you at the Main Entrance upon arrival. For doctor visits, patients may have 1 support person with them. For treatment visits, patients can not have anyone with them due to social distancing guidelines and our immunocompromised population.     

## 2019-07-04 NOTE — Progress Notes (Signed)
Steven [name] tolerated port lab draw well without complaints or incident. Port accessed with 20 gauge needle with blood drawn for labs ordered then flushed easily per protocol then de-accessed. VSS Pt discharged self ambulatory in satisfactory condition

## 2019-07-05 ENCOUNTER — Ambulatory Visit (HOSPITAL_COMMUNITY): Payer: Medicare Other | Admitting: Hematology

## 2019-07-05 LAB — PROTEIN ELECTROPHORESIS, SERUM
A/G Ratio: 1.7 (ref 0.7–1.7)
Albumin ELP: 4.5 g/dL — ABNORMAL HIGH (ref 2.9–4.4)
Alpha-1-Globulin: 0.2 g/dL (ref 0.0–0.4)
Alpha-2-Globulin: 0.4 g/dL (ref 0.4–1.0)
Beta Globulin: 1.1 g/dL (ref 0.7–1.3)
Gamma Globulin: 1 g/dL (ref 0.4–1.8)
Globulin, Total: 2.7 g/dL (ref 2.2–3.9)
Total Protein ELP: 7.2 g/dL (ref 6.0–8.5)

## 2019-07-05 LAB — KAPPA/LAMBDA LIGHT CHAINS
Kappa free light chain: 38.9 mg/L — ABNORMAL HIGH (ref 3.3–19.4)
Kappa, lambda light chain ratio: 3.5 — ABNORMAL HIGH (ref 0.26–1.65)
Lambda free light chains: 11.1 mg/L (ref 5.7–26.3)

## 2019-07-08 LAB — IMMUNOFIXATION ELECTROPHORESIS
IgA: 305 mg/dL (ref 90–386)
IgG (Immunoglobin G), Serum: 1192 mg/dL (ref 603–1613)
IgM (Immunoglobulin M), Srm: 93 mg/dL (ref 20–172)
Total Protein ELP: 6.9 g/dL (ref 6.0–8.5)

## 2019-07-13 ENCOUNTER — Other Ambulatory Visit: Payer: Self-pay

## 2019-07-13 ENCOUNTER — Inpatient Hospital Stay (HOSPITAL_BASED_OUTPATIENT_CLINIC_OR_DEPARTMENT_OTHER): Payer: Medicare Other | Admitting: Hematology

## 2019-07-13 ENCOUNTER — Encounter (HOSPITAL_COMMUNITY): Payer: Self-pay | Admitting: Hematology

## 2019-07-13 VITALS — BP 166/86 | HR 69 | Temp 97.1°F | Resp 18 | Wt 170.9 lb

## 2019-07-13 DIAGNOSIS — C9001 Multiple myeloma in remission: Secondary | ICD-10-CM

## 2019-07-13 DIAGNOSIS — M549 Dorsalgia, unspecified: Secondary | ICD-10-CM | POA: Diagnosis not present

## 2019-07-13 DIAGNOSIS — G8929 Other chronic pain: Secondary | ICD-10-CM | POA: Diagnosis not present

## 2019-07-13 DIAGNOSIS — K047 Periapical abscess without sinus: Secondary | ICD-10-CM | POA: Diagnosis not present

## 2019-07-13 NOTE — Progress Notes (Signed)
[place], [place] [address]   CLINIC:  Medical Oncology/Hematology  PCP:  Monico Blitz, [place] [address] [phone]   REASON FOR VISIT:  Follow-up for multiple myeloma  CURRENT Steven [name]   BRIEF ONCOLOGIC HISTORY:  Oncology History  Multiple myeloma in remission ([place])  06/05/2003 Initial Diagnosis   Myeloma, presenting with excruciating back pain and T12 vertebral body fracture, kappa light chain multiple myeloma diagnosed with 2030 mg light chains per 24 hours and urine   06/09/2003 - 06/22/2003 Radiation Therapy   19 Gray to T11-L1 or T12 vertebral body fracture. [place] T2-T4 for T3 vertebral body involvement   06/26/2003 - 07/25/2004 Chemotherapy   Doxil, Velcade, and dexamethasone    02/27/2004 Bone Marrow Biopsy   Normal cellular bone marrow showing trilineage hematopoiesis with less than 10% plasma cells.   07/25/2004 Bone Marrow Transplant   Transplant after high-dose chemotherapy with melphalan 200 mg/m, but no maintenance therapy was given, transplant at [facility].   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  Followed by [facility] Department of dentistry.   06/22/2008 Progression   Relapse with The light chains in 24-hour urine found, therapy reinitiated with Revlimid/dexamethasone.   07/31/2008 - 08/10/2013 Chemotherapy   Revlimid/dexamethasone initiated for recurrent disease.    08/27/2010 - 09/10/2010 Radiation Therapy   2500 cGy to right hip for impending right femoral neck fracture   08/04/2013 Imaging   Bone density-normal with T score of -1.0.   01/19/2014 Imaging   MRI pelvis-partially visualized right L4-L5 and L5-S1 facet arthritis with periarticular edema. This can be associated with referred pain to the right hip. Small abnormal marrow signal foci in the proximal right femur are likely representing treated myeloma. There is no cortical erosion, stress reaction or  stress fracture. Similar lesions are present in the right supra-acetabular ileum.   02/08/2014 Imaging   Bone survey-no significant change in widespread myelomatous involvement of skeleton. Multiple pathologic fractures in the thoracic spine at L1 appears stable. No interval pathologic fracture identified.   12/26/2015 Tumor Marker   M-spike NOT OBSERVED; normal IgG, IgM, IgA, & kappa/lambda light chains.     02/26/2016 Treatment Plan Change   Transfer of medical oncology care to [facility]   02/26/2016 Tumor Marker   M-spike NOT OBSERVED; normal IgG, IgM, IgA, & kappa/lambda light chains.    03/22/2016 Imaging   Skeletal survey imaging: IMPRESSION: Stable appearance of widespread myelomatous involvement of the skeleton. No progressive lesions are observed. Stable appearing partial compressions of thoracic vertebral bodies. Stable vertebra plana at T12.   05/29/2016 Tumor Marker   M-spike NOT OBSERVED; normal IgG, IgM, IgA, & kappa/lambda light chains.       CANCER STAGING: Cancer Staging No matching staging information was found for the patient.   INTERVAL HISTORY:  Steven [name] 59 y.o. male for follow-up of multiple myeloma.  Appetite and energy levels are 100%.  Back pain is well controlled with the current pain regimen.  Numbness in the feet has been stable.  Denies any new onset pains.    REVIEW OF SYSTEMS:  Review of Systems  Musculoskeletal: Positive for back pain.  Neurological: Positive for numbness.     PAST MEDICAL/SURGICAL HISTORY:  Past Medical History:  Diagnosis Date  . Multiple myeloma in remission ([place]) 11/27/2015   History reviewed. No pertinent surgical history.   SOCIAL HISTORY:  Social History   Socioeconomic History  .  Marital status: Married    Spouse name: Not on file  . Number of children: Not on file  . Years of education: Not on file  . Highest education level: Not on file  Occupational History  . Not on file  Tobacco  Use  . Smoking status: Never Smoker  . Smokeless tobacco: Never Used  Substance and Sexual Activity  . Alcohol use: No  . Drug use: No  . Sexual activity: Not on file  Other Topics Concern  . Not on file  Social History Narrative  . Not on file   Social Determinants of Health   Financial Resource Strain:   . Difficulty of Paying Living Expenses: Not on file  Food Insecurity:   . Worried About Charity fundraiser in the Last Year: Not on file  . Ran Out of Food in the Last Year: Not on file  Transportation Needs:   . Lack of Transportation (Medical): Not on file  . Lack of Transportation (Non-Medical): Not on file  Physical Activity:   . Days of Exercise per Week: Not on file  . Minutes of Exercise per Session: Not on file  Stress:   . Feeling of Stress : Not on file  Social Connections:   . Frequency of Communication with Friends and Family: Not on file  . Frequency of Social Gatherings with Friends and Family: Not on file  . Attends Religious Services: Not on file  . Active Member of Clubs or Organizations: Not on file  . Attends Archivist Meetings: Not on file  . Marital Status: Not on file  Intimate Partner Violence:   . Fear of Current or Ex-Partner: Not on file  . Emotionally Abused: Not on file  . Physically Abused: Not on file  . Sexually Abused: Not on file    FAMILY HISTORY:  Family History  Problem Relation Age of Onset  . Diabetes Mother   . Cancer Sister     CURRENT MEDICATIONS:  Outpatient Encounter Medications as of 07/13/2019  Medication Sig Note  . aspirin (GOODSENSE ASPIRIN) 325 MG tablet Take 325 mg by mouth once a week.  12/26/2015: Received from: [facility]  . calcium citrate-vitamin D (CITRACAL+D) 315-200 MG-UNIT tablet Take 1 tablet by mouth daily.  12/26/2015: Received from: [place]  . folic acid (FOLVITE) 1 MG tablet Take 1 mg by mouth. 12/26/2015: Received from: [facility]  . KLOR-CON M20 20 MEQ tablet TAKE 1 TABLET  BY MOUTH EVERY DAY   . methadone (DOLOPHINE) 10 MG tablet Take 1 tablet (10 mg total) by mouth every 12 (twelve) hours.   . chlorhexidine (PERIDEX) 0.12 % solution USE AS DIRECTED 15 MLS IN THE MOUTH OR THROAT 2 (TWO) TIMES DAILY. (Patient not taking: Reported on 07/13/2019)   . lidocaine-prilocaine (EMLA) cream Apply 1 application topically as needed.   . polyethylene glycol (MIRALAX / GLYCOLAX) packet Take 17 g by mouth daily as needed.  12/26/2015: Received from: [facility]  . Pramox-PE-Glycerin-Petrolatum (PREPARATION H) 1-0.25-14.4-15 % CREA Place rectally.   . [DISCONTINUED] amoxicillin-clavulanate (AUGMENTIN) 875-125 MG tablet Take 1 tablet by mouth 2 (two) times daily.   . [DISCONTINUED] calcium carbonate (TUMS - DOSED IN MG ELEMENTAL CALCIUM) 500 MG chewable tablet Chew by mouth.    Facility-Administered Encounter Medications as of 07/13/2019  Medication  . sodium chloride flush (NS) 0.9 % injection 10 mL    ALLERGIES:  No Known Allergies   PHYSICAL EXAM:  ECOG Performance status:  1  Vitals:   07/13/19 1103  BP: (!) 166/86  Pulse: 69  Resp: 18  Temp: (!) 97.1 F (36.2 C)  SpO2: 97%   Filed Weights   07/13/19 1103  Weight: 170 lb 14.4 oz (77.5 kg)    Physical Exam Vitals reviewed.  Constitutional:      Appearance: Normal appearance.  Cardiovascular:     Rate and Rhythm: Normal rate and regular rhythm.     Heart sounds: Normal heart sounds.  Pulmonary:     Effort: Pulmonary effort is normal.     Breath sounds: Normal breath sounds.  Abdominal:     General: There is no distension.     Palpations: Abdomen is soft. There is no mass.  Musculoskeletal:        General: No swelling.  Skin:    General: Skin is warm.  Neurological:     General: No focal deficit present.     Mental Status: He is alert and oriented to person, place, and time.  Psychiatric:        Mood and Affect: Mood normal.        Behavior: Behavior normal.      LABORATORY DATA:  I have  reviewed the labs as listed.  CBC    Component Value Date/Time   WBC 4.0 07/04/2019 1346   RBC 5.39 07/04/2019 1346   HGB 12.9 (L) 07/04/2019 1346   HCT 42.4 07/04/2019 1346   PLT 102 (L) 07/04/2019 1346   MCV 78.7 (L) 07/04/2019 1346   MCH 23.9 (L) 07/04/2019 1346   MCHC 30.4 07/04/2019 1346   RDW 16.1 (H) 07/04/2019 1346   LYMPHSABS 2.1 07/04/2019 1346   MONOABS 0.4 07/04/2019 1346   EOSABS 0.1 07/04/2019 1346   BASOSABS 0.0 07/04/2019 1346   CMP Latest Ref Rng & Units 07/04/2019 02/22/2019 10/15/2018  Glucose 70 - 99 mg/dL 97 116(H) 95  BUN 6 - 20 mg/dL 12 11 12   Creatinine 0.61 - 1.24 mg/dL 0.91 0.83 0.84  Sodium 135 - 145 mmol/L 139 141 141  Potassium 3.5 - 5.1 mmol/L 3.7 3.5 3.7  Chloride 98 - 111 mmol/L 107 106 109  CO2 22 - 32 mmol/L 25 25 25   Calcium 8.9 - 10.3 mg/dL 8.4(L) 8.5(L) 8.1(L)  Total Protein 6.5 - 8.1 g/dL 7.4 7.5 7.0  Total Bilirubin 0.3 - 1.2 mg/dL 1.2 1.7(H) 1.2  Alkaline Phos 38 - 126 U/L 47 47 46  AST 15 - 41 U/L 19 20 20   ALT 0 - 44 U/L 15 13 14        DIAGNOSTIC IMAGING:  I have independently reviewed the scans and discussed with the patient.   I have reviewed Venita Lick LPN's note and agree with the documentation.  I personally performed a face-to-face visit, made revisions and my assessment and plan is as follows.    ASSESSMENT & PLAN:   Multiple myeloma in remission ([place]) 1.  Multiple myeloma: -Presentation with back pain with T12 vertebral fracture resulting in mid back pain, status post kyphoplasty. 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  Revlimid maintenance until 2015.  Zometa discontinued prior to that. - Last skeletal survey on 06/19/2017 showed stable lytic lesions in the axial and appendicular skeleton.  No new lesions. -He denies any B symptoms.  Denies any new onset bone pains. -We reviewed labs from 07/04/2019.  M spike is 0.  Immunofixation was normal.  However his kappa light chains increased to 38.9 from 22.5 previously.   Light  chain ratio also increased to 3.5 from 2.08 and 1.61 previously.  Creatinine was normal.  Hemoglobin was normal at 12.9.  Calcium is 8.4. -It is possible that he is beginning to relapse.  I will set him up for PET scan in 2 months with repeat labs.   2.  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 -He takes methadone 10 mg every 12 hours which is controlling pain.  3.  Dental abscess: - He reports pain in the upper incisor.  I have given prescription for Augmentin twice daily for 5 days.  He will call his dentist at [facility].    Orders placed this encounter:  Orders Placed This Encounter  Procedures  . NM PET Image Restag (PS) Skull Base To Thigh  . 24 hr, Ur UPEP/UIFE/Light Chains/TP  . CBC with Differential/Platelet  . Comprehensive metabolic panel  . Protein electrophoresis, serum  . Kappa/lambda light chains  . Lactate dehydrogenase  . Beta 2 microglobuline, serum  . CBC with Differential/Platelet  . Comprehensive metabolic panel      Derek Jack, MD [place] [phone]

## 2019-07-13 NOTE — Patient Instructions (Signed)
[facility] at [facility] Discharge Instructions  You were seen today by Dr. Delton Coombes. He went over your recent lab results. He will schedule you for a PET scan prior to your next visit. He will see you back in 2 months for labs and follow up.   Thank you for choosing [facility] to provide your oncology and hematology care.  To afford each patient quality time with our provider, please arrive at least 15 minutes before your scheduled appointment time.   If you have a lab appointment with the [facility] please come in thru the  Main Entrance and check in at the main information desk  You need to re-schedule your appointment should you arrive 10 or more minutes late.  We strive to give you quality time with our providers, and arriving late affects you and other patients whose appointments are after yours.  Also, if you no show three or more times for appointments you may be dismissed from the clinic at the providers discretion.     Again, thank you for choosing [facility].  Our hope is that these requests will decrease the amount of time that you wait before being seen by our physicians.       _____________________________________________________________  Should you have questions after your visit to [facility], please contact our office at [phone] between the hours of 8:00 a.m. and 4:30 p.m.  Voicemails left after 4:00 p.m. will not be returned until the following business day.  For prescription refill requests, have your pharmacy contact our office and allow 72 hours.    Cancer Center Support Programs:   > Cancer Support Group  2nd Tuesday of the month 1pm-2pm, Journey Room

## 2019-07-17 NOTE — Assessment & Plan Note (Signed)
1.  Multiple myeloma: -Presentation with back pain with T12 vertebral fracture resulting in mid back pain, status post kyphoplasty. 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  Revlimid maintenance until 2015.  Zometa discontinued prior to that. - Last skeletal survey on 06/19/2017 showed stable lytic lesions in the axial and appendicular skeleton.  No new lesions. -He denies any B symptoms.  Denies any new onset bone pains. -We reviewed labs from 07/04/2019.  M spike is 0.  Immunofixation was normal.  However his kappa light chains increased to 38.9 from 22.5 previously.  Light chain ratio also increased to 3.5 from 2.08 and 1.61 previously.  Creatinine was normal.  Hemoglobin was normal at 12.9.  Calcium is 8.4. -It is possible that he is beginning to relapse.  I will set him up for PET scan in 2 months with repeat labs.   2.  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 -He takes methadone 10 mg every 12 hours which is controlling pain.  3.  Dental abscess: - He reports pain in the upper incisor.  I have given prescription for Augmentin twice daily for 5 days.  He will call his dentist at [facility]

## 2019-07-26 ENCOUNTER — Other Ambulatory Visit (HOSPITAL_COMMUNITY): Payer: Self-pay | Admitting: *Deleted

## 2019-07-26 DIAGNOSIS — C9001 Multiple myeloma in remission: Secondary | ICD-10-CM

## 2019-07-26 DIAGNOSIS — M546 Pain in thoracic spine: Secondary | ICD-10-CM

## 2019-07-26 DIAGNOSIS — G8929 Other chronic pain: Secondary | ICD-10-CM

## 2019-07-26 MED ORDER — METHADONE HCL 10 MG PO TABS: 10.0000 mg | ORAL_TABLET | Freq: Two times a day (BID) | ORAL | 0 refills | Status: DC

## 2019-08-24 ENCOUNTER — Other Ambulatory Visit (HOSPITAL_COMMUNITY): Payer: Self-pay | Admitting: *Deleted

## 2019-08-24 DIAGNOSIS — C9001 Multiple myeloma in remission: Secondary | ICD-10-CM

## 2019-08-24 DIAGNOSIS — M546 Pain in thoracic spine: Secondary | ICD-10-CM

## 2019-08-24 DIAGNOSIS — G8929 Other chronic pain: Secondary | ICD-10-CM

## 2019-08-24 MED ORDER — METHADONE HCL 10 MG PO TABS: 10.0000 mg | ORAL_TABLET | Freq: Two times a day (BID) | ORAL | 0 refills | Status: DC

## 2019-08-26 DIAGNOSIS — Z23 Encounter for immunization: Secondary | ICD-10-CM | POA: Diagnosis not present

## 2019-09-12 ENCOUNTER — Other Ambulatory Visit: Payer: Self-pay

## 2019-09-12 ENCOUNTER — Ambulatory Visit (HOSPITAL_COMMUNITY)
Admission: RE | Admit: 2019-09-12 | Discharge: 2019-09-12 | Disposition: A | Discharge status: Home / Self Care | Payer: Medicare Other | Source: Ambulatory Visit | Attending: Hematology | Admitting: Hematology

## 2019-09-12 ENCOUNTER — Inpatient Hospital Stay (HOSPITAL_COMMUNITY): Payer: Medicare Other

## 2019-09-12 ENCOUNTER — Inpatient Hospital Stay (HOSPITAL_COMMUNITY): Payer: Medicare Other | Attending: Hematology

## 2019-09-12 DIAGNOSIS — C9000 Multiple myeloma not having achieved remission: Secondary | ICD-10-CM | POA: Diagnosis not present

## 2019-09-12 DIAGNOSIS — C9001 Multiple myeloma in remission: Secondary | ICD-10-CM | POA: Insufficient documentation

## 2019-09-12 DIAGNOSIS — G8929 Other chronic pain: Secondary | ICD-10-CM | POA: Diagnosis not present

## 2019-09-12 LAB — CBC WITH DIFFERENTIAL/PLATELET
Abs Immature Granulocytes: 0.04 10*3/uL (ref 0.00–0.07)
Basophils Absolute: 0 10*3/uL (ref 0.0–0.1)
Basophils Relative: 1 %
Eosinophils Absolute: 0.1 10*3/uL (ref 0.0–0.5)
Eosinophils Relative: 2 %
HCT: 40.7 % (ref 39.0–52.0)
Hemoglobin: 12.6 g/dL — ABNORMAL LOW (ref 13.0–17.0)
Immature Granulocytes: 1 %
Lymphocytes Relative: 44 %
Lymphs Abs: 1.8 10*3/uL (ref 0.7–4.0)
MCH: 24.4 pg — ABNORMAL LOW (ref 26.0–34.0)
MCHC: 31 g/dL (ref 30.0–36.0)
MCV: 78.9 fL — ABNORMAL LOW (ref 80.0–100.0)
Monocytes Absolute: 0.3 10*3/uL (ref 0.1–1.0)
Monocytes Relative: 8 %
Neutro Abs: 1.8 10*3/uL (ref 1.7–7.7)
Neutrophils Relative %: 44 %
Platelets: 119 10*3/uL — ABNORMAL LOW (ref 150–400)
RBC: 5.16 MIL/uL (ref 4.22–5.81)
RDW: 16.6 % — ABNORMAL HIGH (ref 11.5–15.5)
WBC: 4.1 10*3/uL (ref 4.0–10.5)
nRBC: 0 % (ref 0.0–0.2)

## 2019-09-12 LAB — COMPREHENSIVE METABOLIC PANEL
ALT: 16 U/L (ref 0–44)
AST: 20 U/L (ref 15–41)
Albumin: 4.2 g/dL (ref 3.5–5.0)
Alkaline Phosphatase: 45 U/L (ref 38–126)
Anion gap: 8 (ref 5–15)
BUN: 15 mg/dL (ref 6–20)
CO2: 23 mmol/L (ref 22–32)
Calcium: 8.7 mg/dL — ABNORMAL LOW (ref 8.9–10.3)
Chloride: 107 mmol/L (ref 98–111)
Creatinine, Ser: 0.83 mg/dL (ref 0.61–1.24)
GFR calc Af Amer: >60 mL/min (ref >60)
GFR calc non Af Amer: >60 mL/min (ref >60)
Glucose, Bld: 96 mg/dL (ref 70–99)
Potassium: 3.7 mmol/L (ref 3.5–5.1)
Sodium: 138 mmol/L (ref 135–145)
Total Bilirubin: 1.3 mg/dL — ABNORMAL HIGH (ref 0.3–1.2)
Total Protein: 7.1 g/dL (ref 6.5–8.1)

## 2019-09-12 LAB — LACTATE DEHYDROGENASE: LDH: 137 U/L (ref 98–192)

## 2019-09-12 MED ORDER — HEPARIN SOD (PORK) LOCK FLUSH 100 UNIT/ML IV SOLN
500.0000 [IU] | Freq: Once | INTRAVENOUS | Status: AC
  Administered 2019-09-12: 500 [IU] via INTRAVENOUS

## 2019-09-12 MED ORDER — FLUDEOXYGLUCOSE F - 18 (FDG) INJECTION
8.2800 | Freq: Once | INTRAVENOUS | Status: AC | PRN
  Administered 2019-09-12: 8.28 via INTRAVENOUS

## 2019-09-12 MED ORDER — SODIUM CHLORIDE 0.9% FLUSH
10.0000 mL | INTRAVENOUS | Status: DC | PRN
  Administered 2019-09-12: 10 mL via INTRAVENOUS

## 2019-09-12 NOTE — Progress Notes (Signed)
Steven [name] presented for Portacath access and flush. Portacath located right chest wall accessed with  H 20 needle. Good blood return present. Portacath flushed with 58ml NS and 500U/46ml Heparin and needle removed intact. Procedure without incident. Patient tolerated procedure well.   Vitals stable and discharged home from clinic ambulatory. Follow up as scheduled.

## 2019-09-13 LAB — KAPPA/LAMBDA LIGHT CHAINS
Kappa free light chain: 48 mg/L — ABNORMAL HIGH (ref 3.3–19.4)
Kappa, lambda light chain ratio: 4.32 — ABNORMAL HIGH (ref 0.26–1.65)
Lambda free light chains: 11.1 mg/L (ref 5.7–26.3)

## 2019-09-13 LAB — BETA 2 MICROGLOBULIN, SERUM: Beta-2 Microglobulin: 1.3 mg/L (ref 0.6–2.4)

## 2019-09-13 MED ORDER — OCTREOTIDE ACETATE 30 MG IM KIT
PACK | INTRAMUSCULAR | Status: AC
  Filled 2019-09-13: qty 1

## 2019-09-14 ENCOUNTER — Other Ambulatory Visit: Payer: Self-pay

## 2019-09-14 ENCOUNTER — Encounter (HOSPITAL_COMMUNITY): Payer: Self-pay | Admitting: Hematology

## 2019-09-14 ENCOUNTER — Inpatient Hospital Stay (HOSPITAL_BASED_OUTPATIENT_CLINIC_OR_DEPARTMENT_OTHER): Payer: Medicare Other | Admitting: Hematology

## 2019-09-14 VITALS — BP 163/89 | HR 76 | Temp 97.5°F | Resp 16 | Wt 172.9 lb

## 2019-09-14 DIAGNOSIS — C9001 Multiple myeloma in remission: Secondary | ICD-10-CM

## 2019-09-14 DIAGNOSIS — G8929 Other chronic pain: Secondary | ICD-10-CM | POA: Diagnosis not present

## 2019-09-14 LAB — PROTEIN ELECTROPHORESIS, SERUM
A/G Ratio: 1.7 (ref 0.7–1.7)
Albumin ELP: 4.4 g/dL (ref 2.9–4.4)
Alpha-1-Globulin: 0.1 g/dL (ref 0.0–0.4)
Alpha-2-Globulin: 0.4 g/dL (ref 0.4–1.0)
Beta Globulin: 1 g/dL (ref 0.7–1.3)
Gamma Globulin: 1 g/dL (ref 0.4–1.8)
Globulin, Total: 2.6 g/dL (ref 2.2–3.9)
Total Protein ELP: 7 g/dL (ref 6.0–8.5)

## 2019-09-14 MED ORDER — OCTREOTIDE ACETATE 20 MG IM KIT
PACK | INTRAMUSCULAR | Status: AC
  Filled 2019-09-14: qty 1

## 2019-09-14 MED ORDER — LANREOTIDE ACETATE 120 MG/0.5ML ~~LOC~~ SOLN
SUBCUTANEOUS | Status: AC
  Filled 2019-09-14: qty 120

## 2019-09-14 MED ORDER — OCTREOTIDE ACETATE 30 MG IM KIT
PACK | INTRAMUSCULAR | Status: AC
  Filled 2019-09-14: qty 1

## 2019-09-14 NOTE — Progress Notes (Signed)
[place], [place] [address]   CLINIC:  Medical Oncology/Hematology  PCP:  Monico Blitz, [place] [address] [phone]   REASON FOR VISIT:  Follow-up for multiple myeloma  CURRENT Steven [name]   BRIEF ONCOLOGIC HISTORY:  Oncology History  Multiple myeloma in remission ([place])  06/05/2003 Initial Diagnosis   Myeloma, presenting with excruciating back pain and T12 vertebral body fracture, kappa light chain multiple myeloma diagnosed with 2030 mg light chains per 24 hours and urine   06/09/2003 - 06/22/2003 Radiation Therapy   19 Gray to T11-L1 or T12 vertebral body fracture. [place] T2-T4 for T3 vertebral body involvement   06/26/2003 - 07/25/2004 Chemotherapy   Doxil, Velcade, and dexamethasone    02/27/2004 Bone Marrow Biopsy   Normal cellular bone marrow showing trilineage hematopoiesis with less than 10% plasma cells.   07/25/2004 Bone Marrow Transplant   Transplant after high-dose chemotherapy with melphalan 200 mg/m, but no maintenance therapy was given, transplant at [facility].   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  Followed by [facility] Department of dentistry.   06/22/2008 Progression   Relapse with The light chains in 24-hour urine found, therapy reinitiated with Revlimid/dexamethasone.   07/31/2008 - 08/10/2013 Chemotherapy   Revlimid/dexamethasone initiated for recurrent disease.    08/27/2010 - 09/10/2010 Radiation Therapy   2500 cGy to right hip for impending right femoral neck fracture   08/04/2013 Imaging   Bone density-normal with T score of -1.0.   01/19/2014 Imaging   MRI pelvis-partially visualized right L4-L5 and L5-S1 facet arthritis with periarticular edema. This can be associated with referred pain to the right hip. Small abnormal marrow signal foci in the proximal right femur are likely representing treated myeloma. There is no cortical erosion, stress reaction or  stress fracture. Similar lesions are present in the right supra-acetabular ileum.   02/08/2014 Imaging   Bone survey-no significant change in widespread myelomatous involvement of skeleton. Multiple pathologic fractures in the thoracic spine at L1 appears stable. No interval pathologic fracture identified.   12/26/2015 Tumor Marker   M-spike NOT OBSERVED; normal IgG, IgM, IgA, & kappa/lambda light chains.     02/26/2016 Treatment Plan Change   Transfer of medical oncology care to [facility]   02/26/2016 Tumor Marker   M-spike NOT OBSERVED; normal IgG, IgM, IgA, & kappa/lambda light chains.    03/22/2016 Imaging   Skeletal survey imaging: IMPRESSION: Stable appearance of widespread myelomatous involvement of the skeleton. No progressive lesions are observed. Stable appearing partial compressions of thoracic vertebral bodies. Stable vertebra plana at T12.   05/29/2016 Tumor Marker   M-spike NOT OBSERVED; normal IgG, IgM, IgA, & kappa/lambda light chains.       CANCER STAGING: Cancer Staging No matching staging information was found for the patient.   INTERVAL HISTORY:  Steven [name] 59 y.o. male seen for follow-up of multiple myeloma.  Appetite and energy levels are 100%.  He is accompanied by his son today.  Numbness in the feet has been stable.  He did not bring the 24-hour urine back yet.    REVIEW OF SYSTEMS:  Review of Systems  Neurological: Positive for numbness.     PAST MEDICAL/SURGICAL HISTORY:  Past Medical History:  Diagnosis Date  . Multiple myeloma in remission ([place]) 11/27/2015   History reviewed. No pertinent surgical history.   SOCIAL HISTORY:  Social History   Socioeconomic History  . Marital status: Married  Spouse name: Not on file  . Number of children: Not on file  . Years of education: Not on file  . Highest education level: Not on file  Occupational History  . Not on file  Tobacco Use  . Smoking status: Never Smoker  .  Smokeless tobacco: Never Used  Substance and Sexual Activity  . Alcohol use: No  . Drug use: No  . Sexual activity: Not on file  Other Topics Concern  . Not on file  Social History Narrative  . Not on file   Social Determinants of Health   Financial Resource Strain:   . Difficulty of Paying Living Expenses:   Food Insecurity:   . Worried About Charity fundraiser in the Last Year:   . Arboriculturist in the Last Year:   Transportation Needs:   . Film/video editor (Medical):   Marland Kitchen Lack of Transportation (Non-Medical):   Physical Activity:   . Days of Exercise per Week:   . Minutes of Exercise per Session:   Stress:   . Feeling of Stress :   Social Connections:   . Frequency of Communication with Friends and Family:   . Frequency of Social Gatherings with Friends and Family:   . Attends Religious Services:   . Active Member of Clubs or Organizations:   . Attends Archivist Meetings:   Marland Kitchen Marital Status:   Intimate Partner Violence:   . Fear of Current or Ex-Partner:   . Emotionally Abused:   Marland Kitchen Physically Abused:   . Sexually Abused:     FAMILY HISTORY:  Family History  Problem Relation Age of Onset  . Diabetes Mother   . Cancer Sister     CURRENT MEDICATIONS:  Outpatient Encounter Medications as of 09/14/2019  Medication Sig Note  . aspirin (GOODSENSE ASPIRIN) 325 MG tablet Take 325 mg by mouth once a week.  12/26/2015: Received from: [facility]  . calcium citrate-vitamin D (CITRACAL+D) 315-200 MG-UNIT tablet Take 1 tablet by mouth daily.  12/26/2015: Received from: [place]  . chlorhexidine (PERIDEX) 0.12 % solution USE AS DIRECTED 15 MLS IN THE MOUTH OR THROAT 2 (TWO) TIMES DAILY. (Patient not taking: Reported on 07/13/2019)   . folic acid (FOLVITE) 1 MG tablet Take 1 mg by mouth. 12/26/2015: Received from: [facility]  . KLOR-CON M20 20 MEQ tablet TAKE 1 TABLET BY MOUTH EVERY DAY   . lidocaine-prilocaine (EMLA) cream Apply 1 application  topically as needed.   . methadone (DOLOPHINE) 10 MG tablet Take 1 tablet (10 mg total) by mouth every 12 (twelve) hours.   . polyethylene glycol (MIRALAX / GLYCOLAX) packet Take 17 g by mouth daily as needed.  12/26/2015: Received from: [facility]  . Pramox-PE-Glycerin-Petrolatum (PREPARATION H) 1-0.25-14.4-15 % CREA Place rectally.    Facility-Administered Encounter Medications as of 09/14/2019  Medication  . sodium chloride flush (NS) 0.9 % injection 10 mL    ALLERGIES:  No Known Allergies   PHYSICAL EXAM:  ECOG Performance status: 1  Vitals:   09/14/19 1117  BP: (!) 163/89  Pulse: 76  Resp: 16  Temp: (!) 97.5 F (36.4 C)  SpO2: 100%   Filed Weights   09/14/19 1117  Weight: 172 lb 14.4 oz (78.4 kg)    Physical Exam Vitals reviewed.  Constitutional:      Appearance: Normal appearance.  Cardiovascular:     Rate and Rhythm: Normal rate and regular rhythm.     Heart sounds:  Normal heart sounds.  Pulmonary:     Effort: Pulmonary effort is normal.     Breath sounds: Normal breath sounds.  Abdominal:     General: There is no distension.     Palpations: Abdomen is soft. There is no mass.  Musculoskeletal:        General: No swelling.  Skin:    General: Skin is warm.  Neurological:     General: No focal deficit present.     Mental Status: He is alert and oriented to person, place, and time.  Psychiatric:        Mood and Affect: Mood normal.        Behavior: Behavior normal.      LABORATORY DATA:  I have reviewed the labs as listed.  CBC    Component Value Date/Time   WBC 4.1 09/12/2019 1502   RBC 5.16 09/12/2019 1502   HGB 12.6 (L) 09/12/2019 1502   HCT 40.7 09/12/2019 1502   PLT 119 (L) 09/12/2019 1502   MCV 78.9 (L) 09/12/2019 1502   MCH 24.4 (L) 09/12/2019 1502   MCHC 31.0 09/12/2019 1502   RDW 16.6 (H) 09/12/2019 1502   LYMPHSABS 1.8 09/12/2019 1502   MONOABS 0.3 09/12/2019 1502   EOSABS 0.1 09/12/2019 1502   BASOSABS 0.0 09/12/2019 1502    CMP Latest Ref Rng & Units 09/12/2019 07/04/2019 02/22/2019  Glucose 70 - 99 mg/dL 96 97 116(H)  BUN 6 - 20 mg/dL 15 12 11   Creatinine 0.61 - 1.24 mg/dL 0.83 0.91 0.83  Sodium 135 - 145 mmol/L 138 139 141  Potassium 3.5 - 5.1 mmol/L 3.7 3.7 3.5  Chloride 98 - 111 mmol/L 107 107 106  CO2 22 - 32 mmol/L 23 25 25   Calcium 8.9 - 10.3 mg/dL 8.7(L) 8.4(L) 8.5(L)  Total Protein 6.5 - 8.1 g/dL 7.1 7.4 7.5  Total Bilirubin 0.3 - 1.2 mg/dL 1.3(H) 1.2 1.7(H)  Alkaline Phos 38 - 126 U/L 45 47 47  AST 15 - 41 U/L 20 19 20   ALT 0 - 44 U/L 16 15 13        DIAGNOSTIC IMAGING:  I have independently reviewed the scan and discussed with the patient.   I have reviewed Venita Lick LPN's note and agree with the documentation.  I personally performed a face-to-face visit, made revisions and my assessment and plan is as follows.    ASSESSMENT & PLAN:   Multiple myeloma in remission ([place]) 1.  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  Revlimid maintenance until 2015.  Zometa discontinued prior to that. -PET scan on 09/12/2019 did not show any myeloma lesions. -SPEP and immunofixation are normal from 09/12/2019.  However kappa light chains are 48 and ratio 4.32.  This has been consistently going up. -He was told to bring 24-hour urine for testing. -I have strongly recommended bone marrow aspiration and biopsy.  We discussed the procedure.  He was very nervous about having it done because of fear of pain.  We will make a referral to [facility] radiology under sedation. -He will come back in a month for follow-up.  2.  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 -He is continue methadone 10 mg every 12 hours which is controlling pain.  3.  Dental abscess: -He was treated with antibiotic at last visit.  He does not report any pain at this time.    Orders placed this encounter:  Orders Placed This Encounter  Procedures  . CBC  with Differential/Platelet  .  Comprehensive metabolic panel  . Protein electrophoresis, serum  . Kappa/lambda light chains  . Lactate dehydrogenase      Derek Jack, MD [place] [phone]

## 2019-09-14 NOTE — Assessment & Plan Note (Signed)
1.  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  Revlimid maintenance until 2015.  Zometa discontinued prior to that. -PET scan on 09/12/2019 did not show any myeloma lesions. -SPEP and immunofixation are normal from 09/12/2019.  However kappa light chains are 48 and ratio 4.32.  This has been consistently going up. -He was told to bring 24-hour urine for testing. -I have strongly recommended bone marrow aspiration and biopsy.  We discussed the procedure.  He was very nervous about having it done because of fear of pain.  We will make a referral to [facility] radiology under sedation. -He will come back in a month for follow-up.  2.  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 -He is continue methadone 10 mg every 12 hours which is controlling pain.  3.  Dental abscess: -He was treated with antibiotic at last visit.  He does not report any pain at this time.

## 2019-09-14 NOTE — Patient Instructions (Addendum)
[place] at [facility] Discharge Instructions  You were seen today by Dr. Delton Coombes. He went over your recent lab and scan results. He will schedule you for a bone marrow biopsy in [place]. He will see you back in 6 weeks for follow up.   Thank you for choosing [place] at [facility] to provide your oncology and hematology care.  To afford each patient quality time with our provider, please arrive at least 15 minutes before your scheduled appointment time.   If you have a lab appointment with the [facility] please come in thru the  Main Entrance and check in at the main information desk  You need to re-schedule your appointment should you arrive 10 or more minutes late.  We strive to give you quality time with our providers, and arriving late affects you and other patients whose appointments are after yours.  Also, if you no show three or more times for appointments you may be dismissed from the clinic at the providers discretion.     Again, thank you for choosing [facility].  Our hope is that these requests will decrease the amount of time that you wait before being seen by our physicians.       _____________________________________________________________  Should you have questions after your visit to [facility], please contact our office at [phone] between the hours of 8:00 a.m. and 4:30 p.m.  Voicemails left after 4:00 p.m. will not be returned until the following business day.  For prescription refill requests, have your pharmacy contact our office and allow 72 hours.    Cancer Center Support Programs:   > Cancer Support Group  2nd Tuesday of the month 1pm-2pm, Journey Room

## 2019-09-20 ENCOUNTER — Other Ambulatory Visit (HOSPITAL_COMMUNITY): Payer: Self-pay | Admitting: *Deleted

## 2019-09-20 DIAGNOSIS — C9001 Multiple myeloma in remission: Secondary | ICD-10-CM

## 2019-09-22 ENCOUNTER — Other Ambulatory Visit (HOSPITAL_COMMUNITY): Payer: Self-pay | Admitting: *Deleted

## 2019-09-22 DIAGNOSIS — C9001 Multiple myeloma in remission: Secondary | ICD-10-CM

## 2019-09-22 DIAGNOSIS — G8929 Other chronic pain: Secondary | ICD-10-CM

## 2019-09-22 LAB — UPEP/UIFE/LIGHT CHAINS/TP, 24-HR UR
% BETA, Urine: 0 %
ALPHA 1 URINE: 0 %
Albumin, U: 100 %
Alpha 2, Urine: 0 %
Free Kappa Lt Chains,Ur: 48.66 mg/L (ref 0.63–113.79)
Free Kappa/Lambda Ratio: >69.51 — ABNORMAL HIGH (ref 1.03–31.76)
Free Lambda Lt Chains,Ur: <0.7 mg/L (ref 0.47–11.77)
GAMMA GLOBULIN URINE: 0 %
Total Protein, Urine-Ur/day: 103 mg/24 hr (ref 30–150)
Total Protein, Urine: 4.7 mg/dL
Total Volume: 2200

## 2019-09-23 ENCOUNTER — Other Ambulatory Visit (HOSPITAL_COMMUNITY): Payer: Self-pay | Admitting: *Deleted

## 2019-09-23 MED ORDER — METHADONE HCL 10 MG PO TABS: 10.0000 mg | ORAL_TABLET | Freq: Two times a day (BID) | ORAL | 0 refills | Status: DC

## 2019-10-24 ENCOUNTER — Other Ambulatory Visit (HOSPITAL_COMMUNITY): Payer: Self-pay | Admitting: *Deleted

## 2019-10-24 DIAGNOSIS — C9001 Multiple myeloma in remission: Secondary | ICD-10-CM

## 2019-10-24 DIAGNOSIS — G8929 Other chronic pain: Secondary | ICD-10-CM

## 2019-10-24 DIAGNOSIS — M546 Pain in thoracic spine: Secondary | ICD-10-CM

## 2019-10-25 ENCOUNTER — Other Ambulatory Visit (HOSPITAL_COMMUNITY): Payer: Self-pay | Admitting: Emergency Medicine

## 2019-10-25 DIAGNOSIS — G8929 Other chronic pain: Secondary | ICD-10-CM

## 2019-10-25 DIAGNOSIS — C9001 Multiple myeloma in remission: Secondary | ICD-10-CM

## 2019-10-25 MED ORDER — METHADONE HCL 10 MG PO TABS: 10.0000 mg | ORAL_TABLET | Freq: Two times a day (BID) | ORAL | 0 refills | Status: DC

## 2019-10-31 ENCOUNTER — Other Ambulatory Visit: Payer: Self-pay

## 2019-10-31 ENCOUNTER — Inpatient Hospital Stay (HOSPITAL_COMMUNITY): Payer: Medicare Other | Attending: Hematology | Admitting: Hematology

## 2019-10-31 VITALS — BP 175/87 | HR 80 | Temp 96.9°F | Resp 18 | Wt 175.3 lb

## 2019-10-31 DIAGNOSIS — C9002 Multiple myeloma in relapse: Secondary | ICD-10-CM | POA: Insufficient documentation

## 2019-10-31 DIAGNOSIS — M549 Dorsalgia, unspecified: Secondary | ICD-10-CM | POA: Insufficient documentation

## 2019-10-31 DIAGNOSIS — G8929 Other chronic pain: Secondary | ICD-10-CM | POA: Diagnosis not present

## 2019-10-31 DIAGNOSIS — C9001 Multiple myeloma in remission: Secondary | ICD-10-CM

## 2019-10-31 DIAGNOSIS — D696 Thrombocytopenia, unspecified: Secondary | ICD-10-CM | POA: Insufficient documentation

## 2019-10-31 NOTE — Patient Instructions (Signed)
[place] at [facility] Discharge Instructions  You were seen today by Dr. Delton Coombes. He went over your recent lab results. He will schedule you for a bone marrow biopsy in [place]. He will see you back after the biopsy for labs and follow up.   Thank you for choosing [place] at [facility] to provide your oncology and hematology care.  To afford each patient quality time with our provider, please arrive at least 15 minutes before your scheduled appointment time.   If you have a lab appointment with the [place] please come in thru the  Main Entrance and check in at the main information desk  You need to re-schedule your appointment should you arrive 10 or more minutes late.  We strive to give you quality time with our providers, and arriving late affects you and other patients whose appointments are after yours.  Also, if you no show three or more times for appointments you may be dismissed from the clinic at the providers discretion.     Again, thank you for choosing [facility].  Our hope is that these requests will decrease the amount of time that you wait before being seen by our physicians.       _____________________________________________________________  Should you have questions after your visit to [facility], please contact our office at [phone] between the hours of 8:00 a.m. and 4:30 p.m.  Voicemails left after 4:00 p.m. will not be returned until the following business day.  For prescription refill requests, have your pharmacy contact our office and allow 72 hours.    Cancer Center Support Programs:   > Cancer Support Group  2nd Tuesday of the month 1pm-2pm, Journey Room

## 2019-11-04 NOTE — Progress Notes (Signed)
[place], [place] [address]   CLINIC:  Medical Oncology/Hematology  PCP:  Monico Blitz, [place] [address] [phone]   REASON FOR VISIT:  Follow-up for multiple myeloma  CURRENT [facility]   BRIEF ONCOLOGIC HISTORY:  Oncology History  Multiple myeloma in remission ([place])  06/05/2003 Initial Diagnosis   Myeloma, presenting with excruciating back pain and T12 vertebral body fracture, kappa light chain multiple myeloma diagnosed with 2030 mg light chains per 24 hours and urine   06/09/2003 - 06/22/2003 Radiation Therapy   19 [place] to T11-L1 or T12 vertebral body fracture. [place] T2-T4 for T3 vertebral body involvement   06/26/2003 - 07/25/2004 Chemotherapy   Doxil, Velcade, and dexamethasone    02/27/2004 Bone Marrow Biopsy   Normal cellular bone marrow showing trilineage hematopoiesis with less than 10% plasma cells.   07/25/2004 Bone Marrow Transplant   Transplant after high-dose chemotherapy with melphalan 200 mg/m, but no maintenance therapy was given, transplant at [facility].   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  Followed by [facility] of dentistry.   06/22/2008 Progression   Relapse with The light chains in 24-hour urine found, therapy reinitiated with Revlimid/dexamethasone.   07/31/2008 - 08/10/2013 Chemotherapy   Revlimid/dexamethasone initiated for recurrent disease.    08/27/2010 - 09/10/2010 Radiation Therapy   2500 cGy to right hip for impending right femoral neck fracture   08/04/2013 Imaging   Bone density-normal with T score of -1.0.   01/19/2014 Imaging   MRI pelvis-partially visualized right L4-L5 and L5-S1 facet arthritis with periarticular edema. This can be associated with referred pain to the right hip. Small abnormal marrow signal foci in the proximal right femur are likely representing treated myeloma. There is no cortical erosion, stress reaction or  stress fracture. Similar lesions are present in the right supra-acetabular ileum.   02/08/2014 Imaging   Bone survey-no significant change in widespread myelomatous involvement of skeleton. Multiple pathologic fractures in the thoracic spine at L1 appears stable. No interval pathologic fracture identified.   12/26/2015 Tumor Marker   M-spike NOT OBSERVED; normal IgG, IgM, IgA, & kappa/lambda light chains.     02/26/2016 Treatment Plan Change   Transfer of medical oncology care to [facility]   02/26/2016 Tumor Marker   M-spike NOT OBSERVED; normal IgG, IgM, IgA, & kappa/lambda light chains.    03/22/2016 Imaging   Skeletal survey imaging: IMPRESSION: Stable appearance of widespread myelomatous involvement of the skeleton. No progressive lesions are observed. Stable appearing partial compressions of thoracic vertebral bodies. Stable vertebra plana at T12.   05/29/2016 Tumor Marker   M-spike NOT OBSERVED; normal IgG, IgM, IgA, & kappa/lambda light chains.       CANCER STAGING: Cancer Staging No matching staging information was found for the patient.   INTERVAL HISTORY:  Mr. Kimbrell 60 y.o. male seen for follow-up of multiple myeloma.  He has done 24-hour urine testing.  He has not had bone marrow biopsy yet.  Appetite and energy levels are 100%.  Pain is well controlled.  Denies fevers, infections.    REVIEW OF SYSTEMS:  Review of Systems  All other systems reviewed and are negative.    PAST MEDICAL/SURGICAL HISTORY:  Past Medical History:  Diagnosis Date  . Multiple myeloma in remission ([place]) 11/27/2015   No past surgical history on file.   SOCIAL HISTORY:  Social History   Socioeconomic History  . Marital status:  Married    Spouse name: Not on file  . Number of children: Not on file  . Years of education: Not on file  . Highest education level: Not on file  Occupational History  . Not on file  Tobacco Use  . Smoking status: Never Smoker  .  Smokeless tobacco: Never Used  Substance and Sexual Activity  . Alcohol use: No  . Drug use: No  . Sexual activity: Not on file  Other Topics Concern  . Not on file  Social History Narrative  . Not on file   Social Determinants of Health   Financial Resource Strain:   . Difficulty of Paying Living Expenses:   Food Insecurity:   . Worried About Charity fundraiser in the Last Year:   . Arboriculturist in the Last Year:   Transportation Needs:   . Film/video editor (Medical):   Marland Kitchen Lack of Transportation (Non-Medical):   Physical Activity:   . Days of Exercise per Week:   . Minutes of Exercise per Session:   Stress:   . Feeling of Stress :   Social Connections:   . Frequency of Communication with Friends and Family:   . Frequency of Social Gatherings with Friends and Family:   . Attends Religious Services:   . Active Member of Clubs or Organizations:   . Attends Archivist Meetings:   Marland Kitchen Marital Status:   Intimate Partner Violence:   . Fear of Current or Ex-Partner:   . Emotionally Abused:   Marland Kitchen Physically Abused:   . Sexually Abused:     FAMILY HISTORY:  Family History  Problem Relation Age of Onset  . Diabetes Mother   . Cancer Sister     CURRENT MEDICATIONS:  Outpatient Encounter Medications as of 10/31/2019  Medication Sig Note  . aspirin (GOODSENSE ASPIRIN) 325 MG tablet Take 325 mg by mouth once a week.  12/26/2015: Received from: [facility]  . calcium citrate-vitamin D (CITRACAL+D) 315-200 MG-UNIT tablet Take 1 tablet by mouth daily.  12/26/2015: Received from: [facility] [facility]  . chlorhexidine (PERIDEX) 0.12 % solution USE AS DIRECTED 15 MLS IN THE MOUTH OR THROAT 2 (TWO) TIMES DAILY.   . folic acid (FOLVITE) 1 MG tablet Take 1 mg by mouth. 12/26/2015: Received from: [facility]  . KLOR-CON M20 20 MEQ tablet TAKE 1 TABLET BY MOUTH EVERY DAY   . methadone (DOLOPHINE) 10 MG tablet Take 1 tablet (10 mg total) by mouth every 12 (twelve) hours.   .  lidocaine-prilocaine (EMLA) cream Apply 1 application topically as needed.   . polyethylene glycol (MIRALAX / GLYCOLAX) packet Take 17 g by mouth daily as needed.  12/26/2015: Received from: [facility]  . Pramox-PE-Glycerin-Petrolatum (PREPARATION H) 1-0.25-14.4-15 % CREA Place rectally.    Facility-Administered Encounter Medications as of 10/31/2019  Medication  . sodium chloride flush (NS) 0.9 % injection 10 mL    ALLERGIES:  No Known Allergies   PHYSICAL EXAM:  ECOG Performance status: 1  Vitals:   10/31/19 1152  BP: (!) 175/87  Pulse: 80  Resp: 18  Temp: (!) 96.9 F (36.1 C)  SpO2: 96%   Filed Weights   10/31/19 1152  Weight: 175 lb 4.8 oz (79.5 kg)    Physical Exam Vitals reviewed.  Constitutional:      Appearance: Normal appearance.  Cardiovascular:     Rate and Rhythm: Normal rate and regular rhythm.     Heart sounds: Normal heart  sounds.  Pulmonary:     Effort: Pulmonary effort is normal.     Breath sounds: Normal breath sounds.  Abdominal:     General: There is no distension.     Palpations: Abdomen is soft. There is no mass.  Musculoskeletal:        General: No swelling.  Skin:    General: Skin is warm.  Neurological:     General: No focal deficit present.     Mental Status: He is alert and oriented to person, place, and time.  Psychiatric:        Mood and Affect: Mood normal.        Behavior: Behavior normal.      LABORATORY DATA:  I have reviewed the labs as listed.  CBC    Component Value Date/Time   WBC 4.1 09/12/2019 1502   RBC 5.16 09/12/2019 1502   HGB 12.6 (L) 09/12/2019 1502   HCT 40.7 09/12/2019 1502   PLT 119 (L) 09/12/2019 1502   MCV 78.9 (L) 09/12/2019 1502   MCH 24.4 (L) 09/12/2019 1502   MCHC 31.0 09/12/2019 1502   RDW 16.6 (H) 09/12/2019 1502   LYMPHSABS 1.8 09/12/2019 1502   MONOABS 0.3 09/12/2019 1502   EOSABS 0.1 09/12/2019 1502   BASOSABS 0.0 09/12/2019 1502   CMP Latest Ref Rng & Units 09/12/2019 07/04/2019  02/22/2019  Glucose 70 - 99 mg/dL 96 97 116(H)  BUN 6 - 20 mg/dL 15 12 11   Creatinine 0.61 - 1.24 mg/dL 0.83 0.91 0.83  Sodium 135 - 145 mmol/L 138 139 141  Potassium 3.5 - 5.1 mmol/L 3.7 3.7 3.5  Chloride 98 - 111 mmol/L 107 107 106  CO2 22 - 32 mmol/L 23 25 25   Calcium 8.9 - 10.3 mg/dL 8.7(L) 8.4(L) 8.5(L)  Total Protein 6.5 - 8.1 g/dL 7.1 7.4 7.5  Total Bilirubin 0.3 - 1.2 mg/dL 1.3(H) 1.2 1.7(H)  Alkaline Phos 38 - 126 U/L 45 47 47  AST 15 - 41 U/L 20 19 20   ALT 0 - 44 U/L 16 15 13        DIAGNOSTIC IMAGING:  I have reviewed scans.   I have reviewed Venita Lick LPN's note and agree with the documentation.  I personally performed a face-to-face visit, made revisions and my assessment and plan is as follows.    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 -Revlimid maintenance until 2015. -PET scan on 09/12/2019 did not show any myeloma lesions. -SPEP and immunofixation on 09/12/2019 was normal.  Kappa light chains have increased to 48.  Ratio has gone up to 4.32 and steadily increasing. -Creatinine and calcium were normal.  LDH was normal. -24-hour urine shows no M spike.  Immunofixation was normal.  Elevated light chain ratio present.  Plan: 1.  Relapsed multiple myeloma: -Based on the worsening free light chain ratio, I have recommended further work-up. -We will schedule him for bone marrow aspiration and biopsy in [facility] under sedation as he is very nervous about it. -I will see him back after the biopsy to discuss results and treatment plan.  2.  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 -He is continuing methadone 10 mg every 12 hours which is controlling pain.  3.  Mild thrombocytopenia: -Latest platelet count is 119.  Platelet count has been low since 2017.    Orders placed this encounter:  Orders Placed This Encounter  Procedures  .  CT Biopsy   . CT BONE MARROW BIOPSY & ASPIRATION      Derek Jack, MD [place] [phone]

## 2019-11-04 NOTE — Assessment & Plan Note (Addendum)
Assessment: -Auto stem cell transplant for multiple myeloma at [facility] in 2006. -Revlimid maintenance until 2015. -PET scan on 09/12/2019 did not show any myeloma lesions. -SPEP and immunofixation on 09/12/2019 was normal.  Kappa light chains have increased to 48.  Ratio has gone up to 4.32 and steadily increasing. -Creatinine and calcium were normal.  LDH was normal. -24-hour urine shows no M spike.  Immunofixation was normal.  Elevated light chain ratio present.  Plan: 1.  Relapsed multiple myeloma: -Based on the worsening free light chain ratio, I have recommended further work-up. -We will schedule him for bone marrow aspiration and biopsy in [facility] under sedation as he is very nervous about it. -I will see him back after the biopsy to discuss results and treatment plan.  2.  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 -He is continuing methadone 10 mg every 12 hours which is controlling pain.  3.  Mild thrombocytopenia: -Latest platelet count is 119.  Platelet count has been low since 2017.

## 2019-11-23 ENCOUNTER — Other Ambulatory Visit (HOSPITAL_COMMUNITY): Payer: Self-pay | Admitting: *Deleted

## 2019-11-23 DIAGNOSIS — G8929 Other chronic pain: Secondary | ICD-10-CM

## 2019-11-23 DIAGNOSIS — C9001 Multiple myeloma in remission: Secondary | ICD-10-CM

## 2019-11-23 MED ORDER — METHADONE HCL 10 MG PO TABS: 10.0000 mg | ORAL_TABLET | Freq: Two times a day (BID) | ORAL | 0 refills | Status: DC

## 2019-11-29 ENCOUNTER — Other Ambulatory Visit: Payer: Self-pay | Admitting: Radiology

## 2019-12-01 ENCOUNTER — Other Ambulatory Visit: Payer: Self-pay | Admitting: Radiology

## 2019-12-02 ENCOUNTER — Other Ambulatory Visit: Payer: Self-pay

## 2019-12-02 ENCOUNTER — Ambulatory Visit (HOSPITAL_COMMUNITY)
Admission: RE | Admit: 2019-12-02 | Discharge: 2019-12-02 | Disposition: A | Discharge status: Home / Self Care | Payer: Medicare Other | Source: Ambulatory Visit | Attending: Hematology | Admitting: Hematology

## 2019-12-02 ENCOUNTER — Encounter (HOSPITAL_COMMUNITY): Payer: Self-pay

## 2019-12-02 DIAGNOSIS — C9001 Multiple myeloma in remission: Secondary | ICD-10-CM

## 2019-12-02 DIAGNOSIS — D6489 Other specified anemias: Secondary | ICD-10-CM | POA: Diagnosis not present

## 2019-12-02 DIAGNOSIS — D696 Thrombocytopenia, unspecified: Secondary | ICD-10-CM | POA: Insufficient documentation

## 2019-12-02 DIAGNOSIS — Z7982 Long term (current) use of aspirin: Secondary | ICD-10-CM | POA: Diagnosis not present

## 2019-12-02 DIAGNOSIS — D4989 Neoplasm of unspecified behavior of other specified sites: Secondary | ICD-10-CM | POA: Diagnosis not present

## 2019-12-02 DIAGNOSIS — D709 Neutropenia, unspecified: Secondary | ICD-10-CM | POA: Diagnosis not present

## 2019-12-02 DIAGNOSIS — D699 Hemorrhagic condition, unspecified: Secondary | ICD-10-CM | POA: Diagnosis not present

## 2019-12-02 DIAGNOSIS — Z79899 Other long term (current) drug therapy: Secondary | ICD-10-CM | POA: Insufficient documentation

## 2019-12-02 DIAGNOSIS — C9030 Solitary plasmacytoma not having achieved remission: Secondary | ICD-10-CM | POA: Insufficient documentation

## 2019-12-02 LAB — CBC WITH DIFFERENTIAL/PLATELET
Abs Immature Granulocytes: 0 10*3/uL (ref 0.00–0.07)
Basophils Absolute: 0 10*3/uL (ref 0.0–0.1)
Basophils Relative: 0 %
Eosinophils Absolute: 0.1 10*3/uL (ref 0.0–0.5)
Eosinophils Relative: 2 %
HCT: 46.3 % (ref 39.0–52.0)
Hemoglobin: 14 g/dL (ref 13.0–17.0)
Immature Granulocytes: 0 %
Lymphocytes Relative: 50 %
Lymphs Abs: 2 10*3/uL (ref 0.7–4.0)
MCH: 23.9 pg — ABNORMAL LOW (ref 26.0–34.0)
MCHC: 30.2 g/dL (ref 30.0–36.0)
MCV: 79 fL — ABNORMAL LOW (ref 80.0–100.0)
Monocytes Absolute: 0.4 10*3/uL (ref 0.1–1.0)
Monocytes Relative: 10 %
Neutro Abs: 1.6 10*3/uL — ABNORMAL LOW (ref 1.7–7.7)
Neutrophils Relative %: 38 %
Platelets: 131 10*3/uL — ABNORMAL LOW (ref 150–400)
RBC: 5.86 MIL/uL — ABNORMAL HIGH (ref 4.22–5.81)
RDW: 16.6 % — ABNORMAL HIGH (ref 11.5–15.5)
WBC: 4 10*3/uL (ref 4.0–10.5)
nRBC: 0 % (ref 0.0–0.2)

## 2019-12-02 LAB — PROTIME-INR
INR: 1 (ref 0.8–1.2)
Prothrombin Time: 12.6 seconds (ref 11.4–15.2)

## 2019-12-02 MED ORDER — NALOXONE HCL 0.4 MG/ML IJ SOLN
INTRAMUSCULAR | Status: AC
  Filled 2019-12-02: qty 1

## 2019-12-02 MED ORDER — LIDOCAINE HCL (PF) 1 % IJ SOLN
INTRAMUSCULAR | Status: AC | PRN
  Administered 2019-12-02: 10 mL via INTRADERMAL

## 2019-12-02 MED ORDER — FENTANYL CITRATE (PF) 100 MCG/2ML IJ SOLN
INTRAMUSCULAR | Status: AC
  Filled 2019-12-02: qty 2

## 2019-12-02 MED ORDER — FLUMAZENIL 0.5 MG/5ML IV SOLN
INTRAVENOUS | Status: AC
  Filled 2019-12-02: qty 5

## 2019-12-02 MED ORDER — MIDAZOLAM HCL 2 MG/2ML IJ SOLN
INTRAMUSCULAR | Status: AC | PRN
  Administered 2019-12-02 (×2): 1 mg via INTRAVENOUS

## 2019-12-02 MED ORDER — FENTANYL CITRATE (PF) 100 MCG/2ML IJ SOLN
INTRAMUSCULAR | Status: AC | PRN
  Administered 2019-12-02 (×2): 50 ug via INTRAVENOUS

## 2019-12-02 MED ORDER — SODIUM CHLORIDE 0.9 % IV SOLN: INTRAVENOUS | Status: DC

## 2019-12-02 MED ORDER — MIDAZOLAM HCL 2 MG/2ML IJ SOLN
INTRAMUSCULAR | Status: AC
  Filled 2019-12-02: qty 4

## 2019-12-02 NOTE — Discharge Instructions (Signed)
Moderate Conscious Sedation, Adult, Care After These instructions provide you with information about caring for yourself after your procedure. Your health care provider may also give you more specific instructions. Your treatment has been planned according to current medical practices, but problems sometimes occur. Call your health care provider if you have any problems or questions after your procedure. What can I expect after the procedure? After your procedure, it is common:  To feel sleepy for several hours.  To feel clumsy and have poor balance for several hours.  To have poor judgment for several hours.  To vomit if you eat too soon. Follow these instructions at home: For at least 24 hours after the procedure: Bone Marrow Aspiration and Bone Marrow Biopsy, Adult, Care After This sheet gives you information about how to care for yourself after your procedure. Your health care provider may also give you more specific instructions. If you have problems or questions, contact your health care provider. What can I expect after the procedure? After the procedure, it is common to have:  Mild pain and tenderness.  Swelling.  Bruising. Follow these instructions at home: Puncture site care   Follow instructions from your health care provider about how to take care of the puncture site. Make sure you: ? Wash your hands with soap and water before and after you change your bandage (dressing). If soap and water are not available, use hand sanitizer. ? Change your dressing as told by your health care provider.  Check your puncture site every day for signs of infection. Check for: ? More redness, swelling, or pain. ? Fluid or blood. ? Warmth. ? Pus or a bad smell. Activity  Return to your normal activities as told by your health care provider. Ask your health care provider what activities are safe for you.  Do not lift anything that is heavier than 10 lb (4.5 kg), or the limit that you  are told, until your health care provider says that it is safe.  Do not drive for 24 hours if you were given a sedative during your procedure. General instructions   Take over-the-counter and prescription medicines only as told by your health care provider.  Do not take baths, swim, or use a hot tub until your health care provider approves. Ask your health care provider if you may take showers. You may only be allowed to take sponge baths.  If directed, put ice on the affected area. To do this: ? Put ice in a plastic bag. ? Place a towel between your skin and the bag. ? Leave the ice on for 20 minutes, 2-3 times a day.  Keep all follow-up visits as told by your health care provider. This is important. Contact a health care provider if:  Your pain is not controlled with medicine.  You have a fever.  You have more redness, swelling, or pain around the puncture site.  You have fluid or blood coming from the puncture site.  Your puncture site feels warm to the touch.  You have pus or a bad smell coming from the puncture site. Summary  After the procedure, it is common to have mild pain, tenderness, swelling, and bruising.  Follow instructions from your health care provider about how to take care of the puncture site and what activities are safe for you.  Take over-the-counter and prescription medicines only as told by your health care provider.  Contact a health care provider if you have any signs of infection, such as  fluid or blood coming from the puncture site. This information is not intended to replace advice given to you by your health care provider. Make sure you discuss any questions you have with your health care provider. Document Revised: 10/19/2018 Document Reviewed: 10/19/2018 Elsevier Patient Education  [place] not: ? Participate in activities where you could fall or become injured. ? Drive. ? Use heavy machinery. ? Drink alcohol. ? Take  sleeping pills or medicines that cause drowsiness. ? Make important decisions or sign legal documents. ? Take care of children on your own.  Rest. Eating and drinking  Follow the diet recommended by your health care provider.  If you vomit: ? Drink water, juice, or soup when you can drink without vomiting. ? Make sure you have little or no nausea before eating solid foods. General instructions  Have a responsible adult stay with you until you are awake and alert.  Take over-the-counter and prescription medicines only as told by your health care provider.  If you smoke, do not smoke without supervision.  Keep all follow-up visits as told by your health care provider. This is important. Contact a health care provider if:  You keep feeling nauseous or you keep vomiting.  You feel light-headed.  You develop a rash.  You have a fever. Get help right away if:  You have trouble breathing. This information is not intended to replace advice given to you by your health care provider. Make sure you discuss any questions you have with your health care provider. Document Revised: 05/15/2017 Document Reviewed: 09/22/2015 Elsevier Patient Education  [place].  Urgent needs - IR on call MD [phone]  Wound - May remove dressing and shower in 24 to 48 hours.  Keep site clean and dry.  Replace with bandaid. Do not submerge in tub or water until site healing well.

## 2019-12-02 NOTE — H&P (Signed)
Chief Complaint: Patient was seen in consultation today for bone marrow biopsy   Referring Physician(s): Katragadda,Sreedhar  Supervising Physician: Sandi Mariscal  Patient Status: [facility] - Out-pt  History of Present Illness: Steven [name] is a 59 y.o. male with hx of myeloma. He is referred for bone marrow biopsy. PMHx, meds, labs, imaging, allergies reviewed. Feels well, no recent fevers, chills, illness. Has been NPO today as directed.   Past Medical History:  Diagnosis Date  . Multiple myeloma in remission ([place]) 11/26/2005    History reviewed. No pertinent surgical history.  Allergies: Patient has no known allergies.  Medications: Prior to Admission medications   Medication Sig Start Date End Date Taking? Authorizing Provider  methadone (DOLOPHINE) 10 MG tablet Take 1 tablet (10 mg total) by mouth every 12 (twelve) hours. 11/23/19  Yes Derek Jack, MD  aspirin (GOODSENSE ASPIRIN) 325 MG tablet Take 325 mg by mouth once a week.     [provider]  calcium citrate-vitamin D (CITRACAL+D) 315-200 MG-UNIT tablet Take 1 tablet by mouth daily.     [provider]  chlorhexidine (PERIDEX) 0.12 % solution USE AS DIRECTED 15 MLS IN THE MOUTH OR THROAT 2 (TWO) TIMES DAILY. 08/02/18   Lockamy, Randi L, NP-C  folic acid (FOLVITE) 1 MG tablet Take 1 mg by mouth.    [provider]  KLOR-CON M20 20 MEQ tablet TAKE 1 TABLET BY MOUTH EVERY DAY 06/20/19   Lockamy, Randi L, NP-C  lidocaine-prilocaine (EMLA) cream Apply 1 application topically as needed.    [provider]  polyethylene glycol (MIRALAX / GLYCOLAX) packet Take 17 g by mouth daily as needed.     [provider]  Pramox-PE-Glycerin-Petrolatum (PREPARATION H) 1-0.25-14.4-15 % CREA Place rectally.    [provider]     Family History  Problem Relation Age of Onset  . Diabetes Mother   . Cancer Sister     Social History   Socioeconomic History  . Marital  status: Married    Spouse name: Not on file  . Number of children: Not on file  . Years of education: Not on file  . Highest education level: Not on file  Occupational History  . Not on file  Tobacco Use  . Smoking status: Never Smoker  . Smokeless tobacco: Never Used  Vaping Use  . Vaping Use: Never used  Substance and Sexual Activity  . Alcohol use: No  . Drug use: No  . Sexual activity: Not on file  Other Topics Concern  . Not on file  Social History Narrative  . Not on file   Social Determinants of Health   Financial Resource Strain:   . Difficulty of Paying Living Expenses:   Food Insecurity:   . Worried About Charity fundraiser in the Last Year:   . Arboriculturist in the Last Year:   Transportation Needs:   . Film/video editor (Medical):   Marland Kitchen Lack of Transportation (Non-Medical):   Physical Activity:   . Days of Exercise per Week:   . Minutes of Exercise per Session:   Stress:   . Feeling of Stress :   Social Connections:   . Frequency of Communication with Friends and Family:   . Frequency of Social Gatherings with Friends and Family:   . Attends Religious Services:   . Active Member of Clubs or Organizations:   . Attends Archivist Meetings:   Marland Kitchen Marital Status:     Review  of Systems: A 12 point ROS discussed and pertinent positives are indicated in the HPI above.  All other systems are negative.  Review of Systems  Vital Signs: There were no vitals taken for this visit.  Physical Exam Constitutional:      Appearance: Normal appearance. He is not ill-appearing.  HENT:     Mouth/Throat:     Mouth: Mucous membranes are moist.     Pharynx: Oropharynx is clear.  Cardiovascular:     Rate and Rhythm: Normal rate and regular rhythm.     Heart sounds: Normal heart sounds.  Pulmonary:     Effort: Pulmonary effort is normal. No respiratory distress.     Breath sounds: Normal breath sounds.  Skin:    General: Skin is warm and dry.    Neurological:     General: No focal deficit present.     Mental Status: He is alert and oriented to person, place, and time.  Psychiatric:        Mood and Affect: Mood normal.        Thought Content: Thought content normal.        Judgment: Judgment normal.     Imaging: No results found.  Labs:  CBC: Recent Labs    02/22/19 1112 07/04/19 1346 09/12/19 1502 12/02/19 0918  WBC 4.6 4.0 4.1 4.0  HGB 11.7* 12.9* 12.6* 14.0  HCT 38.3* 42.4 40.7 46.3  PLT 100* 102* 119* 131*    COAGS: Recent Labs    12/02/19 0918  INR 1.0    BMP: Recent Labs    02/22/19 1112 07/04/19 1346 09/12/19 1502  NA 141 139 138  K 3.5 3.7 3.7  CL 106 107 107  CO2 25 25 23   GLUCOSE 116* 97 96  BUN 11 12 15   CALCIUM 8.5* 8.4* 8.7*  CREATININE 0.83 0.91 0.83  GFRNONAA >60 >60 >60  GFRAA >60 >60 >60    LIVER FUNCTION TESTS: Recent Labs    02/22/19 1112 07/04/19 1346 09/12/19 1502  BILITOT 1.7* 1.2 1.3*  AST 20 19 20   ALT 13 15 16   ALKPHOS 47 47 45  PROT 7.5 7.4 7.1  ALBUMIN 4.3 4.3 4.2    TUMOR MARKERS: No results for input(s): AFPTM, CEA, CA199, CHROMGRNA in the last 8760 hours.  Assessment and Plan: Myeloma For CT guided bone marrow biopsy Risks and benefits of bone marrow bx was discussed with the patient and/or patient's family including, but not limited to bleeding, infection, damage to adjacent structures or low yield requiring additional tests.  All of the questions were answered and there is agreement to proceed.  Consent signed and in chart.    Thank you for this interesting consult.  I greatly enjoyed meeting NASHTON BELSON and look forward to participating in their care.  A copy of this report was sent to the requesting provider on this date.  Electronically Signed: Ascencion Dike, PA-C 12/02/2019, 10:50 AM   I spent a total of 20 minutes in face to face in clinical consultation, greater than 50% of which was counseling/coordinating care for BM bx

## 2019-12-02 NOTE — Procedures (Signed)
Pre-procedure Diagnosis: Myeloma Post-procedure Diagnosis: Same  Technically successful CT guided bone marrow aspiration and biopsy of left iliac crest.   Complications: None Immediate  EBL: None  Signed: Sandi Mariscal Pager: [phone] 12/02/2019, 12:44 PM

## 2019-12-06 LAB — SURGICAL PATHOLOGY

## 2019-12-12 ENCOUNTER — Encounter (HOSPITAL_COMMUNITY): Payer: Self-pay | Admitting: Hematology

## 2019-12-17 ENCOUNTER — Other Ambulatory Visit (HOSPITAL_COMMUNITY): Payer: Self-pay | Admitting: Nurse Practitioner

## 2019-12-17 DIAGNOSIS — C9001 Multiple myeloma in remission: Secondary | ICD-10-CM

## 2019-12-20 ENCOUNTER — Inpatient Hospital Stay (HOSPITAL_COMMUNITY): Payer: Medicare Other | Attending: Hematology | Admitting: Hematology

## 2019-12-20 ENCOUNTER — Other Ambulatory Visit: Payer: Self-pay

## 2019-12-20 VITALS — BP 171/93 | HR 72 | Temp 97.3°F | Resp 18 | Wt 173.6 lb

## 2019-12-20 DIAGNOSIS — G8929 Other chronic pain: Secondary | ICD-10-CM | POA: Insufficient documentation

## 2019-12-20 DIAGNOSIS — Z833 Family history of diabetes mellitus: Secondary | ICD-10-CM | POA: Diagnosis not present

## 2019-12-20 DIAGNOSIS — D696 Thrombocytopenia, unspecified: Secondary | ICD-10-CM | POA: Diagnosis not present

## 2019-12-20 DIAGNOSIS — Z79899 Other long term (current) drug therapy: Secondary | ICD-10-CM | POA: Diagnosis not present

## 2019-12-20 DIAGNOSIS — C9001 Multiple myeloma in remission: Secondary | ICD-10-CM

## 2019-12-20 DIAGNOSIS — C9002 Multiple myeloma in relapse: Secondary | ICD-10-CM | POA: Diagnosis not present

## 2019-12-20 DIAGNOSIS — Z809 Family history of malignant neoplasm, unspecified: Secondary | ICD-10-CM | POA: Diagnosis not present

## 2019-12-20 DIAGNOSIS — R200 Anesthesia of skin: Secondary | ICD-10-CM | POA: Insufficient documentation

## 2019-12-20 DIAGNOSIS — R5383 Other fatigue: Secondary | ICD-10-CM | POA: Diagnosis not present

## 2019-12-20 NOTE — Progress Notes (Signed)
[place], [place] [address]   CLINIC:  Medical Oncology/Hematology  PCP:  Monico Blitz, MD [address] [place] [address]  [phone]  REASON FOR VISIT:  Follow-up for multiple myeloma  PRIOR THERAPY:  1. 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 2. Revlimid maintenance until 2015.  CURRENT THERAPY: Observation  INTERVAL HISTORY:  Steven [name], a 59 y.o. male, returns for routine follow-up for his multiple myeloma. Steven [name] was last seen on 10/31/2019.  Today Steven [name] reports that his pain has been stable on methadone BID and has not had any new pains. Steven [name] has been off the Revlimid since 2015; Steven [name] tolerated it well.   REVIEW OF SYSTEMS:  Review of Systems  Constitutional: Positive for fatigue (mild). Negative for appetite change.  Neurological: Positive for numbness (feet).  All other systems reviewed and are negative.   PAST MEDICAL/SURGICAL HISTORY:  Past Medical History:  Diagnosis Date  . Multiple myeloma in remission ([place]) 11/26/2005   No past surgical history on file.  SOCIAL HISTORY:  Social History   Socioeconomic History  . Marital status: Married    Spouse name: Not on file  . Number of children: Not on file  . Years of education: Not on file  . Highest education level: Not on file  Occupational History  . Not on file  Tobacco Use  . Smoking status: Never Smoker  . Smokeless tobacco: Never Used  Vaping Use  . Vaping Use: Never used  Substance and Sexual Activity  . Alcohol use: No  . Drug use: No  . Sexual activity: Not on file  Other Topics Concern  . Not on file  Social History Narrative  . Not on file   Social Determinants of Health   Financial Resource Strain:   . Difficulty of Paying Living Expenses:   Food Insecurity:   . Worried About Charity fundraiser in the Last Year:   . Arboriculturist in the Last Year:   Transportation Needs:   . Film/video editor (Medical):   Marland Kitchen Lack of  Transportation (Non-Medical):   Physical Activity:   . Days of Exercise per Week:   . Minutes of Exercise per Session:   Stress:   . Feeling of Stress :   Social Connections:   . Frequency of Communication with Friends and Family:   . Frequency of Social Gatherings with Friends and Family:   . Attends Religious Services:   . Active Member of Clubs or Organizations:   . Attends Archivist Meetings:   Marland Kitchen Marital Status:   Intimate Partner Violence:   . Fear of Current or Ex-Partner:   . Emotionally Abused:   Marland Kitchen Physically Abused:   . Sexually Abused:     FAMILY HISTORY:  Family History  Problem Relation Age of Onset  . Diabetes Mother   . Cancer Sister     CURRENT MEDICATIONS:  Current Outpatient Medications  Medication Sig Dispense Refill  . aspirin (GOODSENSE ASPIRIN) 325 MG tablet Take 325 mg by mouth once a week.     . calcium citrate-vitamin D (CITRACAL+D) 315-200 MG-UNIT tablet Take 1 tablet by mouth daily.     . chlorhexidine (PERIDEX) 0.12 % solution USE AS DIRECTED 15 MLS IN THE MOUTH OR THROAT 2 (TWO) TIMES DAILY. 709 mL 2  . folic acid (FOLVITE) 1 MG tablet Take 1 mg by mouth.    Marland Kitchen KLOR-CON M20 20 MEQ  tablet TAKE 1 TABLET BY MOUTH EVERY DAY 90 tablet 1  . methadone (DOLOPHINE) 10 MG tablet Take 1 tablet (10 mg total) by mouth every 12 (twelve) hours. 60 tablet 0  . Pramox-PE-Glycerin-Petrolatum (PREPARATION H) 1-0.25-14.4-15 % CREA Place rectally.    . lidocaine-prilocaine (EMLA) cream Apply 1 application topically as needed. (Patient not taking: Reported on 12/20/2019)    . polyethylene glycol (MIRALAX / GLYCOLAX) packet Take 17 g by mouth daily as needed.  (Patient not taking: Reported on 12/20/2019)     No current facility-administered medications for this visit.   Facility-Administered Medications Ordered in Other Visits  Medication Dose Route Frequency Provider Last Rate Last Admin  . sodium chloride flush (NS) 0.9 % injection 10 mL  10 mL Intravenous PRN  Baird Cancer, PA-C   10 mL at 06/19/17 1029    ALLERGIES:  No Known Allergies  PHYSICAL EXAM:  Performance status (ECOG): 1 - Symptomatic but completely ambulatory  Vitals:   12/20/19 1510  BP: (!) 171/93  Pulse: 72  Resp: 18  Temp: (!) 97.3 F (36.3 C)  SpO2: 97%   Wt Readings from Last 3 Encounters:  12/20/19 78.7 kg (173 lb 9.6 oz)  10/31/19 79.5 kg (175 lb 4.8 oz)  09/14/19 78.4 kg (172 lb 14.4 oz)   Physical Exam Vitals reviewed.  Constitutional:      Appearance: Normal appearance.  Musculoskeletal:     Comments: Back binder  Neurological:     General: No focal deficit present.     Mental Status: Steven [name] is alert and oriented to person, place, and time.  Psychiatric:        Mood and Affect: Mood normal.        Behavior: Behavior normal.     LABORATORY DATA:  I have reviewed the labs as listed.  CBC Latest Ref Rng & Units 12/02/2019 09/12/2019 07/04/2019  WBC 4.0 - 10.5 K/uL 4.0 4.1 4.0  Hemoglobin 13.0 - 17.0 g/dL 14.0 12.6(L) 12.9(L)  Hematocrit 39 - 52 % 46.3 40.7 42.4  Platelets 150 - 400 K/uL 131(L) 119(L) 102(L)   CMP Latest Ref Rng & Units 09/12/2019 07/04/2019 02/22/2019  Glucose 70 - 99 mg/dL 96 97 116(H)  BUN 6 - 20 mg/dL 15 12 11   Creatinine 0.61 - 1.24 mg/dL 0.83 0.91 0.83  Sodium 135 - 145 mmol/L 138 139 141  Potassium 3.5 - 5.1 mmol/L 3.7 3.7 3.5  Chloride 98 - 111 mmol/L 107 107 106  CO2 22 - 32 mmol/L 23 25 25   Calcium 8.9 - 10.3 mg/dL 8.7(L) 8.4(L) 8.5(L)  Total Protein 6.5 - 8.1 g/dL 7.1 7.4 7.5  Total Bilirubin 0.3 - 1.2 mg/dL 1.3(H) 1.2 1.7(H)  Alkaline Phos 38 - 126 U/L 45 47 47  AST 15 - 41 U/L 20 19 20   ALT 0 - 44 U/L 16 15 13       Component Value Date/Time   RBC 5.86 (H) 12/02/2019 0918   MCV 79.0 (L) 12/02/2019 0918   MCH 23.9 (L) 12/02/2019 0918   MCHC 30.2 12/02/2019 0918   RDW 16.6 (H) 12/02/2019 0918   LYMPHSABS 2.0 12/02/2019 0918   MONOABS 0.4 12/02/2019 0918   EOSABS 0.1 12/02/2019 0918   BASOSABS 0.0 12/02/2019 0918     Lab Results  Component Value Date   TOTALPROTELP 7.0 09/12/2019   ALBUMINELP 4.4 09/12/2019   A1GS 0.1 09/12/2019   A2GS 0.4 09/12/2019   BETS 1.0 09/12/2019   GAMS 1.0 09/12/2019   MSPIKE Not  Observed 09/12/2019   SPEI Comment 09/12/2019    Lab Results  Component Value Date   KPAFRELGTCHN 48.0 (H) 09/12/2019   LAMBDASER 11.1 09/12/2019   KAPLAMBRATIO >69.51 (H) 09/20/2019    DIAGNOSTIC IMAGING:  I have independently reviewed the scans and discussed with the patient. CT Biopsy  Result Date: 12/02/2019 INDICATION: History of multiple myeloma. Please perform CT-guided bone marrow biopsy tissue diagnostic purposes. EXAM: CT-GUIDED BONE MARROW BIOPSY AND ASPIRATION MEDICATIONS: None ANESTHESIA/SEDATION: Fentanyl 100 mcg IV; Versed 2 mg IV Sedation Time: 11 Minutes; The patient was continuously monitored during the procedure by the interventional radiology nurse under my direct supervision. COMPLICATIONS: None immediate. PROCEDURE: Informed consent was obtained from the patient following an explanation of the procedure, risks, benefits and alternatives. The patient understands, agrees and consents for the procedure. All questions were addressed. A time out was performed prior to the initiation of the procedure. The patient was positioned prone and non-contrast localization CT was performed of the pelvis to demonstrate the iliac marrow spaces. The operative site was prepped and draped in the usual sterile fashion. Under sterile conditions and local anesthesia, a 22 gauge spinal needle was utilized for procedural planning. Next, an 11 gauge coaxial bone biopsy needle was advanced into the left iliac marrow space. Needle position was confirmed with CT imaging. Initially, a bone marrow aspiration was performed. Next, a bone marrow biopsy was obtained with the 11 gauge outer bone marrow device. The 11 gauge coaxial bone biopsy needle was re-advanced into a slightly different location within the left  iliac marrow space, positioning was confirmed with CT imaging and an additional bone marrow biopsy was obtained. Samples were prepared with the cytotechnologist and deemed adequate. The needle was removed and superficial hemostasis was obtained with manual compression. A dressing was applied. The patient tolerated the procedure well without immediate post procedural complication. IMPRESSION: Successful CT guided left iliac bone marrow aspiration and core biopsy. Electronically Signed   By: Sandi Mariscal M.D.   On: 12/02/2019 13:15   CT BONE MARROW BIOPSY & ASPIRATION  Result Date: 12/02/2019 INDICATION: History of multiple myeloma. Please perform CT-guided bone marrow biopsy tissue diagnostic purposes. EXAM: CT-GUIDED BONE MARROW BIOPSY AND ASPIRATION MEDICATIONS: None ANESTHESIA/SEDATION: Fentanyl 100 mcg IV; Versed 2 mg IV Sedation Time: 11 Minutes; The patient was continuously monitored during the procedure by the interventional radiology nurse under my direct supervision. COMPLICATIONS: None immediate. PROCEDURE: Informed consent was obtained from the patient following an explanation of the procedure, risks, benefits and alternatives. The patient understands, agrees and consents for the procedure. All questions were addressed. A time out was performed prior to the initiation of the procedure. The patient was positioned prone and non-contrast localization CT was performed of the pelvis to demonstrate the iliac marrow spaces. The operative site was prepped and draped in the usual sterile fashion. Under sterile conditions and local anesthesia, a 22 gauge spinal needle was utilized for procedural planning. Next, an 11 gauge coaxial bone biopsy needle was advanced into the left iliac marrow space. Needle position was confirmed with CT imaging. Initially, a bone marrow aspiration was performed. Next, a bone marrow biopsy was obtained with the 11 gauge outer bone marrow device. The 11 gauge coaxial bone biopsy needle  was re-advanced into a slightly different location within the left iliac marrow space, positioning was confirmed with CT imaging and an additional bone marrow biopsy was obtained. Samples were prepared with the cytotechnologist and deemed adequate. The needle was removed and superficial hemostasis  was obtained with manual compression. A dressing was applied. The patient tolerated the procedure well without immediate post procedural complication. IMPRESSION: Successful CT guided left iliac bone marrow aspiration and core biopsy. Electronically Signed   By: Sandi Mariscal M.D.   On: 12/02/2019 13:15     ASSESSMENT:  1.  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 -Revlimid maintenance until 2015. -PET scan on 09/12/2019 did not show any myeloma lesions. -SPEP and immunofixation on 09/12/2019 was normal.  Kappa light chains have increased to 48.  Ratio has gone up to 4.32 and steadily increasing. -Creatinine and calcium were normal.  LDH was normal. -24-hour urine shows no M spike.  Immunofixation was normal.  Elevated light chain ratio present. -Bone marrow biopsy on 12/02/2019 shows normocellular marrow with plasma cells ranging from 10-20%.  Chromosome analysis was normal.  FISH panel is pending.   PLAN:  1.  Relapsed multiple myeloma: -I discussed the results of the bone marrow biopsy with the patient in detail. -There is evidence of recurrence of myeloma with 10-20% plasma cells. -We will follow up on the [facility] panel. -I have recommended repeating myeloma panel and skeletal survey. -We will plan to discuss treatment options at next visit.  2.  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  Continue methadone 10 mg every 12 hours.  3.  Mild thrombocytopenia: -Mild thrombocytopenia stable since 2017.  Most recent platelet was 131.  Orders placed this encounter:  Orders Placed This Encounter  Procedures  . DG Bone Survey Met  .  CBC with Differential/Platelet  . Comprehensive metabolic panel  . Protein electrophoresis, serum  . Kappa/lambda light chains  . Lactate dehydrogenase     Derek Jack, MD [place] [phone]   I, Milinda Antis, am acting as a scribe for Dr. Sanda Linger.  I, Derek Jack MD, have reviewed the above documentation for accuracy and completeness, and I agree with the above.

## 2019-12-20 NOTE — Patient Instructions (Signed)
[place] at [facility] Discharge Instructions  You were seen today by Dr. Delton Coombes. He went over your recent test results. He will repeat your labs and a skeletal survey. He will see you back in 2 weeks for follow up.   Thank you for choosing [place] at [facility] to provide your oncology and hematology care.  To afford each patient quality time with our provider, please arrive at least 15 minutes before your scheduled appointment time.   If you have a lab appointment with the [facility] please come in thru the  Main Entrance and check in at the main information desk  You need to re-schedule your appointment should you arrive 10 or more minutes late.  We strive to give you quality time with our providers, and arriving late affects you and other patients whose appointments are after yours.  Also, if you no show three or more times for appointments you may be dismissed from the clinic at the providers discretion.     Again, thank you for choosing [facility].  Our hope is that these requests will decrease the amount of time that you wait before being seen by our physicians.       _____________________________________________________________  Should you have questions after your visit to [facility], please contact our office at [phone] between the hours of 8:00 a.m. and 4:30 p.m.  Voicemails left after 4:00 p.m. will not be returned until the following business day.  For prescription refill requests, have your pharmacy contact our office and allow 72 hours.    Cancer Center Support Programs:   > Cancer Support Group  2nd Tuesday of the month 1pm-2pm, Journey Room

## 2019-12-23 ENCOUNTER — Other Ambulatory Visit (HOSPITAL_COMMUNITY): Payer: Self-pay | Admitting: Surgery

## 2019-12-23 DIAGNOSIS — C9001 Multiple myeloma in remission: Secondary | ICD-10-CM

## 2019-12-23 DIAGNOSIS — G8929 Other chronic pain: Secondary | ICD-10-CM

## 2019-12-23 MED ORDER — METHADONE HCL 10 MG PO TABS: 10.0000 mg | ORAL_TABLET | Freq: Two times a day (BID) | ORAL | 0 refills | Status: DC

## 2020-01-02 ENCOUNTER — Ambulatory Visit (HOSPITAL_COMMUNITY)
Admission: RE | Admit: 2020-01-02 | Discharge: 2020-01-02 | Disposition: A | Discharge status: Home / Self Care | Payer: Medicare Other | Source: Ambulatory Visit | Attending: Hematology | Admitting: Hematology

## 2020-01-02 ENCOUNTER — Other Ambulatory Visit: Payer: Self-pay

## 2020-01-02 ENCOUNTER — Inpatient Hospital Stay (HOSPITAL_COMMUNITY): Payer: Medicare Other

## 2020-01-02 DIAGNOSIS — D696 Thrombocytopenia, unspecified: Secondary | ICD-10-CM | POA: Diagnosis not present

## 2020-01-02 DIAGNOSIS — G8929 Other chronic pain: Secondary | ICD-10-CM | POA: Diagnosis not present

## 2020-01-02 DIAGNOSIS — C9001 Multiple myeloma in remission: Secondary | ICD-10-CM

## 2020-01-02 DIAGNOSIS — Z79899 Other long term (current) drug therapy: Secondary | ICD-10-CM | POA: Diagnosis not present

## 2020-01-02 DIAGNOSIS — R200 Anesthesia of skin: Secondary | ICD-10-CM | POA: Diagnosis not present

## 2020-01-02 DIAGNOSIS — R5383 Other fatigue: Secondary | ICD-10-CM | POA: Diagnosis not present

## 2020-01-02 DIAGNOSIS — C9002 Multiple myeloma in relapse: Secondary | ICD-10-CM | POA: Diagnosis not present

## 2020-01-02 LAB — CBC WITH DIFFERENTIAL/PLATELET
Abs Immature Granulocytes: 0.01 10*3/uL (ref 0.00–0.07)
Basophils Absolute: 0 10*3/uL (ref 0.0–0.1)
Basophils Relative: 1 %
Eosinophils Absolute: 0.1 10*3/uL (ref 0.0–0.5)
Eosinophils Relative: 2 %
HCT: 39 % (ref 39.0–52.0)
Hemoglobin: 12 g/dL — ABNORMAL LOW (ref 13.0–17.0)
Immature Granulocytes: 0 %
Lymphocytes Relative: 55 %
Lymphs Abs: 2.6 10*3/uL (ref 0.7–4.0)
MCH: 24.3 pg — ABNORMAL LOW (ref 26.0–34.0)
MCHC: 30.8 g/dL (ref 30.0–36.0)
MCV: 79.1 fL — ABNORMAL LOW (ref 80.0–100.0)
Monocytes Absolute: 0.4 10*3/uL (ref 0.1–1.0)
Monocytes Relative: 10 %
Neutro Abs: 1.5 10*3/uL — ABNORMAL LOW (ref 1.7–7.7)
Neutrophils Relative %: 32 %
Platelets: 110 10*3/uL — ABNORMAL LOW (ref 150–400)
RBC: 4.93 MIL/uL (ref 4.22–5.81)
RDW: 16.4 % — ABNORMAL HIGH (ref 11.5–15.5)
WBC: 4.6 10*3/uL (ref 4.0–10.5)
nRBC: 0 % (ref 0.0–0.2)

## 2020-01-02 LAB — COMPREHENSIVE METABOLIC PANEL
ALT: 16 U/L (ref 0–44)
AST: 18 U/L (ref 15–41)
Albumin: 4.1 g/dL (ref 3.5–5.0)
Alkaline Phosphatase: 41 U/L (ref 38–126)
Anion gap: 7 (ref 5–15)
BUN: 14 mg/dL (ref 6–20)
CO2: 25 mmol/L (ref 22–32)
Calcium: 8.6 mg/dL — ABNORMAL LOW (ref 8.9–10.3)
Chloride: 106 mmol/L (ref 98–111)
Creatinine, Ser: 0.89 mg/dL (ref 0.61–1.24)
GFR calc Af Amer: >60 mL/min (ref >60)
GFR calc non Af Amer: >60 mL/min (ref >60)
Glucose, Bld: 93 mg/dL (ref 70–99)
Potassium: 3.7 mmol/L (ref 3.5–5.1)
Sodium: 138 mmol/L (ref 135–145)
Total Bilirubin: 1 mg/dL (ref 0.3–1.2)
Total Protein: 7.1 g/dL (ref 6.5–8.1)

## 2020-01-02 LAB — LACTATE DEHYDROGENASE: LDH: 128 U/L (ref 98–192)

## 2020-01-02 MED ORDER — SODIUM CHLORIDE 0.9% FLUSH
10.0000 mL | INTRAVENOUS | Status: DC | PRN
  Administered 2020-01-02: 10 mL via INTRAVENOUS

## 2020-01-02 MED ORDER — HEPARIN SOD (PORK) LOCK FLUSH 100 UNIT/ML IV SOLN
500.0000 [IU] | Freq: Once | INTRAVENOUS | Status: AC
  Administered 2020-01-02: 500 [IU] via INTRAVENOUS

## 2020-01-02 NOTE — Progress Notes (Signed)
Steven [name] presented for Portacath access and flush.  Portacath located right chest wall accessed with  H 20 needle.  Good blood return present. Portacath flushed with 25ml NS and 500U/43ml Heparin and needle removed intact.  Procedure tolerated well and without incident.   Pt d/c clinic ambulatory.

## 2020-01-03 LAB — PROTEIN ELECTROPHORESIS, SERUM
A/G Ratio: 1.3 (ref 0.7–1.7)
Albumin ELP: 3.7 g/dL (ref 2.9–4.4)
Alpha-1-Globulin: 0.2 g/dL (ref 0.0–0.4)
Alpha-2-Globulin: 0.5 g/dL (ref 0.4–1.0)
Beta Globulin: 1.1 g/dL (ref 0.7–1.3)
Gamma Globulin: 1.1 g/dL (ref 0.4–1.8)
Globulin, Total: 2.8 g/dL (ref 2.2–3.9)
Total Protein ELP: 6.5 g/dL (ref 6.0–8.5)

## 2020-01-03 LAB — KAPPA/LAMBDA LIGHT CHAINS
Kappa free light chain: 118.7 mg/L — ABNORMAL HIGH (ref 3.3–19.4)
Kappa, lambda light chain ratio: 11.2 — ABNORMAL HIGH (ref 0.26–1.65)
Lambda free light chains: 10.6 mg/L (ref 5.7–26.3)

## 2020-01-03 LAB — BETA 2 MICROGLOBULIN, SERUM: Beta-2 Microglobulin: 1.4 mg/L (ref 0.6–2.4)

## 2020-01-09 ENCOUNTER — Inpatient Hospital Stay (HOSPITAL_BASED_OUTPATIENT_CLINIC_OR_DEPARTMENT_OTHER): Payer: Medicare Other | Admitting: Hematology

## 2020-01-09 ENCOUNTER — Other Ambulatory Visit: Payer: Self-pay

## 2020-01-09 VITALS — BP 159/91 | HR 78 | Temp 98.5°F | Resp 18 | Wt 174.9 lb

## 2020-01-09 DIAGNOSIS — C9002 Multiple myeloma in relapse: Secondary | ICD-10-CM

## 2020-01-09 DIAGNOSIS — C9001 Multiple myeloma in remission: Secondary | ICD-10-CM

## 2020-01-09 DIAGNOSIS — D696 Thrombocytopenia, unspecified: Secondary | ICD-10-CM | POA: Diagnosis not present

## 2020-01-09 DIAGNOSIS — G8929 Other chronic pain: Secondary | ICD-10-CM | POA: Diagnosis not present

## 2020-01-09 DIAGNOSIS — R200 Anesthesia of skin: Secondary | ICD-10-CM | POA: Diagnosis not present

## 2020-01-09 DIAGNOSIS — R5383 Other fatigue: Secondary | ICD-10-CM | POA: Diagnosis not present

## 2020-01-09 DIAGNOSIS — Z79899 Other long term (current) drug therapy: Secondary | ICD-10-CM | POA: Diagnosis not present

## 2020-01-09 MED ORDER — LENALIDOMIDE 15 MG PO CAPS
15.0000 mg | ORAL_CAPSULE | Freq: Every day | ORAL | 0 refills | Status: DC
Start: 2020-01-09 — End: 2020-01-12

## 2020-01-09 MED ORDER — DEXAMETHASONE 4 MG PO TABS
20.0000 mg | ORAL_TABLET | ORAL | 3 refills | Status: DC
Start: 2020-01-09 — End: 2020-05-14

## 2020-01-09 NOTE — Progress Notes (Signed)
Steven [name], Steven [name] [id number]   CLINIC:  Medical Oncology/Hematology  PCP:  Monico Blitz, MD [address] Steven [name] [address]  [phone]  REASON FOR VISIT:  Follow-up for multiple myeloma  PRIOR THERAPY:  1. 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 2. Revlimid maintenance until 2015.  CURRENT THERAPY: Restart Revlimid & Steroids.   INTERVAL HISTORY:  [name], a 59 y.o. male, returns for routine follow-up for his multiple myeloma. Steven [name] was last seen on 12/20/2019.  He denied any issue in the past with Revlimid. He did have some issue with weight gain due to the steroid pills. He denies any new pains. He does have tingling in his bilateral feet.   REVIEW OF SYSTEMS:  Review of Systems  Constitutional: Negative.   HENT:  Negative.   Eyes: Negative.   Respiratory: Negative.   Cardiovascular: Negative.   Gastrointestinal: Negative.   Endocrine: Negative.   Genitourinary: Negative.    Musculoskeletal: Negative.   Skin: Negative.   Neurological: Positive for numbness (Bilateral feet).  Hematological: Negative.   Psychiatric/Behavioral: Negative.   All other systems reviewed and are negative.   PAST MEDICAL/SURGICAL HISTORY:  Past Medical History:  Diagnosis Date  . Multiple myeloma in remission (Steven [name]) 11/26/2005   No past surgical history on file.  SOCIAL HISTORY:  Social History   Socioeconomic History  . Marital status: Legally Separated    Spouse name: Not on file  . Number of children: Not on file  . Years of education: Not on file  . Highest education level: Not on file  Occupational History  . Not on file  Tobacco Use  . Smoking status: Never Smoker  . Smokeless tobacco: Never Used  Vaping Use  . Vaping Use: Never used  Substance and Sexual Activity  . Alcohol use: No  . Drug use: No  . Sexual activity: Not on file  Other Topics Concern  . Not on file  Social History Narrative  . Not on  file   Social Determinants of Health   Financial Resource Strain:   . Difficulty of Paying Living Expenses:   Food Insecurity:   . Worried About Charity fundraiser in the Last Year:   . Arboriculturist in the Last Year:   Transportation Needs:   . Film/video editor (Medical):   Marland Kitchen Lack of Transportation (Non-Medical):   Physical Activity:   . Days of Exercise per Week:   . Minutes of Exercise per Session:   Stress:   . Feeling of Stress :   Social Connections:   . Frequency of Communication with Friends and Family:   . Frequency of Social Gatherings with Friends and Family:   . Attends Religious Services:   . Active Member of Clubs or Organizations:   . Attends Archivist Meetings:   Marland Kitchen Marital Status:   Intimate Partner Violence:   . Fear of Current or Ex-Partner:   . Emotionally Abused:   Marland Kitchen Physically Abused:   . Sexually Abused:     FAMILY HISTORY:  Family History  Problem Relation Age of Onset  . Diabetes Mother   . Cancer Sister     CURRENT MEDICATIONS:  Current Outpatient Medications  Medication Sig Dispense Refill  . aspirin (GOODSENSE ASPIRIN) 325 MG tablet Take 325 mg by mouth once a week.     . calcium citrate-vitamin D (CITRACAL+D) 315-200 MG-UNIT tablet Take 1 tablet by  mouth daily.     . chlorhexidine (PERIDEX) 0.12 % solution USE AS DIRECTED 15 MLS IN THE MOUTH OR THROAT 2 (TWO) TIMES DAILY. 998 mL 2  . folic acid (FOLVITE) 1 MG tablet Take 1 mg by mouth.    Marland Kitchen KLOR-CON M20 20 MEQ tablet TAKE 1 TABLET BY MOUTH EVERY DAY 90 tablet 1  . methadone (DOLOPHINE) 10 MG tablet Take 1 tablet (10 mg total) by mouth every 12 (twelve) hours. 60 tablet 0  . Pramox-PE-Glycerin-Petrolatum (PREPARATION H) 1-0.25-14.4-15 % CREA Place rectally.    . lidocaine-prilocaine (EMLA) cream Apply 1 application topically as needed. (Patient not taking: Reported on 12/20/2019)    . polyethylene glycol (MIRALAX / GLYCOLAX) packet Take 17 g by mouth daily as needed.   (Patient not taking: Reported on 12/20/2019)     No current facility-administered medications for this visit.   Facility-Administered Medications Ordered in Other Visits  Medication Dose Route Frequency Provider Last Rate Last Admin  . sodium chloride flush (NS) 0.9 % injection 10 mL  10 mL Intravenous PRN Baird Cancer, PA-C   10 mL at 06/19/17 1029    ALLERGIES:  No Known Allergies  PHYSICAL EXAM:  Performance status (ECOG): 1 - Symptomatic but completely ambulatory  Vitals:   01/09/20 1556  BP: (!) 159/91  Pulse: 78  Resp: 18  Temp: 98.5 F (36.9 C)  SpO2: 100%   Wt Readings from Last 3 Encounters:  01/09/20 174 lb 14.4 oz (79.3 kg)  12/20/19 173 lb 9.6 oz (78.7 kg)  10/31/19 175 lb 4.8 oz (79.5 kg)   Physical Exam Vitals and nursing note reviewed.  Constitutional:      Appearance: Normal appearance.  HENT:     Mouth/Throat:     Mouth: Mucous membranes are moist.  Eyes:     Pupils: Pupils are equal, round, and reactive to light.  Cardiovascular:     Rate and Rhythm: Normal rate and regular rhythm.     Pulses: Normal pulses.     Heart sounds: Normal heart sounds.  Pulmonary:     Effort: Pulmonary effort is normal.     Breath sounds: Normal breath sounds.  Abdominal:     Palpations: Abdomen is soft. There is no mass.     Tenderness: There is no abdominal tenderness.  Musculoskeletal:     Right lower leg: No edema.     Left lower leg: No edema.  Neurological:     Mental Status: He is alert and oriented to person, place, and time.  Psychiatric:        Mood and Affect: Mood normal.        Behavior: Behavior normal.     LABORATORY DATA:  I have reviewed the labs as listed.  CBC Latest Ref Rng & Units 01/02/2020 12/02/2019 09/12/2019  WBC 4.0 - 10.5 K/uL 4.6 4.0 4.1  Hemoglobin 13.0 - 17.0 g/dL 12.0(L) 14.0 12.6(L)  Hematocrit 39 - 52 % 39.0 46.3 40.7  Platelets 150 - 400 K/uL 110(L) 131(L) 119(L)   CMP Latest Ref Rng & Units 01/02/2020 09/12/2019 07/04/2019   Glucose 70 - 99 mg/dL 93 96 97  BUN 6 - 20 mg/dL 14 15 12   Creatinine 0.61 - 1.24 mg/dL 0.89 0.83 0.91  Sodium 135 - 145 mmol/L 138 138 139  Potassium 3.5 - 5.1 mmol/L 3.7 3.7 3.7  Chloride 98 - 111 mmol/L 106 107 107  CO2 22 - 32 mmol/L 25 23 25   Calcium 8.9 - 10.3 mg/dL 8.6(L)  8.7(L) 8.4(L)  Total Protein 6.5 - 8.1 g/dL 7.1 7.1 7.4  Total Bilirubin 0.3 - 1.2 mg/dL 1.0 1.3(H) 1.2  Alkaline Phos 38 - 126 U/L 41 45 47  AST 15 - 41 U/L 18 20 19   ALT 0 - 44 U/L 16 16 15       Component Value Date/Time   RBC 4.93 01/02/2020 1431   MCV 79.1 (L) 01/02/2020 1431   MCH 24.3 (L) 01/02/2020 1431   MCHC 30.8 01/02/2020 1431   RDW 16.4 (H) 01/02/2020 1431   LYMPHSABS 2.6 01/02/2020 1431   MONOABS 0.4 01/02/2020 1431   EOSABS 0.1 01/02/2020 1431   BASOSABS 0.0 01/02/2020 1431     DIAGNOSTIC IMAGING:  I have independently reviewed the scans and discussed with the patient. DG Bone Survey Met  Result Date: 01/03/2020 CLINICAL DATA:  Multiple myeloma in remission. EXAM: METASTATIC BONE SURVEY COMPARISON:  06/19/2017 FINDINGS: Skull: No significant change in a large number of small rounded areas of lucency in the skull. Cervical spine:  Degenerative changes with no focal lucencies seen. Thoracic spine: Degenerative changes and previously noted T12 compression deformity. Possible focal lucencies in the T5 and T6 vertebral bodies, not previously visible. Lumbar spine: Previously noted L1 vertebral kyphoplasty material. No interval lucencies. Chest: Normal sized heart. Clear lungs. Stable right jugular porta catheter. No visible rib lucencies. Bilateral shoulders:  No focal lucencies. Bilateral humeri: Multiple small oval lucencies in both humeri without significant change. Bilateral forearms: Small focal lucencies in the proximal radius on the right without significant change. Pelvis:  Diffuse patchy osteopenia without significant change. Bilateral hips:  No hip joint abnormalities. Bilateral femurs:  Multiple small lucencies in both femurs without significant change. Bilateral lower legs: Unremarkable lower legs with no focal lucencies. IMPRESSION: 1. Possible new or better demonstrated lucencies in the T5-T6 vertebral bodies. 2. Otherwise, no significant change in lucencies in multiple bones compatible with the patient's known multiple myeloma. Electronically Signed   By: Claudie Revering M.D.   On: 01/03/2020 16:02     ASSESSMENT:  1.  Relapsed kappa light chain multiple myeloma: -Diagnosis on 06/05/2003, presentation with T12 vertebral body fracture, kappa light chain myeloma diagnosed with 2.03 g of light chains/24-hour urine. -06/09/2003 -06/22/2003 radiation 19 Gray total T11-L1, 19 grade T2-T4 for T3 vertebral body involvement. -06/26/2003 chemotherapy with Doxil, Velcade and dexamethasone. 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 -06/22/2008 relapse of myeloma with kappa light chains and 24-hour urine found, treatment initiated with Revlimid/dexamethasone from 07/31/2008 through 08/10/2013. -Radiation therapy 25 centigrade to right hip for impending right femoral neck fracture from 08/27/2010 through 09/10/2010. -PET scan on 09/12/2019 did not show any myeloma lesions. -SPEP and immunofixation on 09/12/2019 was normal. Kappa light chains have increased to 48. Ratio has gone up to 4.32 and steadily increasing. -Creatinine and calcium were normal. LDH was normal. -24-hour urine shows no M spike. Immunofixation was normal. Elevated light chain ratio present. -Bone marrow biopsy on 12/02/2019 shows normocellular marrow with plasma cells ranging from 10-20%.  Chromosome analysis was normal.  FISH panel was negative for high-risk abnormalities. -Skeletal survey on 01/02/2020 shows possible new or better demonstrated lucencies in the T5 and T6 vertebral  bodies. -Labs on 01/02/2020 shows kappa light chains 118.7, ratio of 11.2, uptrending.  SPEP is negative.   PLAN:  1. Relapsed multiple myeloma: -I have discussed the results of the bone  marrow FISH panel as well as skeletal survey and recent blood work. -As the kappa light chains and the free light chain ratio is trending up, I have recommended restarting treatments.  As there is only minimal elevation, I have recommended starting back on Revlimid and dexamethasone which he tolerated in the past very well. -We will start Revlimid 15 mg 3 weeks on 1 week off.  We will start dexamethasone 20 mg weekly. -He is already taking full dose aspirin. -We talked about side effects in detail.  I will see him back in 2 weeks after start of Revlimid.  2. Chronic mid back pain: -Continue methadone 10 mg every 12 hours.  Pain is well controlled.  3. Mild thrombocytopenia: -He has mild thrombocytopenia stable since 2017.  Latest platelet count is 110.  4.  Thromboprophylaxis: -Continue aspirin 325 mg daily.  Orders placed this encounter:  No orders of the defined types were placed in this encounter.  Total time spent is 40 minutes with more than 50% of the time spent face-to-face discussing results of various studies, starting new therapy, side effect discussion, counseling and coordination of care.  Derek Jack, MD [facility] [phone]   I, Jacqualyn Posey, am acting as a scribe for Dr. Sanda Linger.  I, Derek Jack MD, have reviewed the above documentation for accuracy and completeness, and I agree with the above.

## 2020-01-09 NOTE — Patient Instructions (Signed)
[place] at [facility] Discharge Instructions  You were seen today by Dr. Delton Coombes. He went over your recent results. You will take your Revlimid 3 weeks on, one week off. You will take your steroid pill every day, even in your off week. Dr. Delton Coombes will see you back in once you start your Revlimid for labs and follow up.   Thank you for choosing [place] at [facility] to provide your oncology and hematology care.  To afford each patient quality time with our provider, please arrive at least 15 minutes before your scheduled appointment time.   If you have a lab appointment with the [place] please come in thru the Main Entrance and check in at the main information desk  You need to re-schedule your appointment should you arrive 10 or more minutes late.  We strive to give you quality time with our providers, and arriving late affects you and other patients whose appointments are after yours.  Also, if you no show three or more times for appointments you may be dismissed from the clinic at the providers discretion.     Again, thank you for choosing [facility].  Our hope is that these requests will decrease the amount of time that you wait before being seen by our physicians.       _____________________________________________________________  Should you have questions after your visit to [facility], please contact our office at [phone] between the hours of 8:00 a.m. and 4:30 p.m.  Voicemails left after 4:00 p.m. will not be returned until the following business day.  For prescription refill requests, have your pharmacy contact our office and allow 72 hours.    Cancer Center Support Programs:   > Cancer Support Group  2nd Tuesday of the month 1pm-2pm, Journey Room

## 2020-01-12 ENCOUNTER — Other Ambulatory Visit (HOSPITAL_COMMUNITY): Payer: Self-pay | Admitting: *Deleted

## 2020-01-12 ENCOUNTER — Other Ambulatory Visit (HOSPITAL_COMMUNITY): Payer: Self-pay

## 2020-01-12 DIAGNOSIS — C9002 Multiple myeloma in relapse: Secondary | ICD-10-CM

## 2020-01-12 MED ORDER — LENALIDOMIDE 15 MG PO CAPS: 15.0000 mg | ORAL_CAPSULE | Freq: Every day | ORAL | 0 refills | Status: DC

## 2020-01-16 ENCOUNTER — Encounter (HOSPITAL_COMMUNITY): Payer: Self-pay

## 2020-01-23 ENCOUNTER — Other Ambulatory Visit (HOSPITAL_COMMUNITY): Payer: Self-pay | Admitting: *Deleted

## 2020-01-23 ENCOUNTER — Other Ambulatory Visit (HOSPITAL_COMMUNITY): Payer: Self-pay

## 2020-01-23 DIAGNOSIS — C9002 Multiple myeloma in relapse: Secondary | ICD-10-CM

## 2020-01-23 DIAGNOSIS — G8929 Other chronic pain: Secondary | ICD-10-CM

## 2020-01-23 DIAGNOSIS — M546 Pain in thoracic spine: Secondary | ICD-10-CM

## 2020-01-23 DIAGNOSIS — C9001 Multiple myeloma in remission: Secondary | ICD-10-CM

## 2020-01-23 MED ORDER — METHADONE HCL 10 MG PO TABS: 10.0000 mg | ORAL_TABLET | Freq: Two times a day (BID) | ORAL | 0 refills | Status: DC

## 2020-01-24 MED ORDER — LENALIDOMIDE 15 MG PO CAPS: 15.0000 mg | ORAL_CAPSULE | Freq: Every day | ORAL | 0 refills | Status: DC

## 2020-01-26 ENCOUNTER — Telehealth (HOSPITAL_COMMUNITY): Payer: Self-pay | Admitting: Pharmacy Technician

## 2020-01-26 NOTE — Telephone Encounter (Signed)
Oral Oncology Patient Advocate Encounter  Received notification from [employer] that prior authorization for Revlimid is required.  PA submitted on CoverMyMeds Key [id number] Status is pending  Oral Oncology Clinic will continue to follow.  [place] Patient [facility] Phone [phone] Fax [phone] 01/26/2020 11:44 AM

## 2020-01-26 NOTE — Telephone Encounter (Signed)
Oral Oncology Patient Advocate Encounter  Prior Authorization for Revlimid has been approved.    PA# [id number] Effective dates: 01/14/20 through 01/25/21  This information will be shared with [facility].    Oral Oncology Clinic will continue to follow.   [place] Patient [place] Phone [phone] Fax [phone] 01/26/2020 11:46 AM

## 2020-01-30 ENCOUNTER — Telehealth (HOSPITAL_COMMUNITY): Payer: Self-pay | Admitting: Pharmacist

## 2020-01-30 ENCOUNTER — Other Ambulatory Visit (HOSPITAL_COMMUNITY): Payer: Medicare Other

## 2020-01-30 DIAGNOSIS — C9002 Multiple myeloma in relapse: Secondary | ICD-10-CM

## 2020-01-30 DIAGNOSIS — C9001 Multiple myeloma in remission: Secondary | ICD-10-CM

## 2020-01-30 NOTE — Telephone Encounter (Signed)
Oral Oncology Pharmacist Encounter  Received new prescription for Revlimid (lenalidomide) for the treatment of multiple myeloma in conjunction with dexamethasone, planned duration until disease progression or unacceptable drug toxicity.  CMP from 01/02/20 assessed, no relevant lab abnormalities. Prescription dose and frequency assessed.   Current medication list in Epic reviewed, no relevant DDIs with lenalidomide identified.   Evaluated chart and no patient barriers to medication adherence identified. Patient has been in Revlimid in the past and appears to have done well with the medication.    Prescription has been e-scribed to the [facility] for benefits analysis and approval.  Oral Oncology Clinic will continue to follow for insurance authorization, copayment issues, initial counseling and start date.  Darl Pikes, PharmD, BCPS, BCOP, CPP Hematology/Oncology Clinical Pharmacist Practitioner [facility] [phone]  01/30/2020 12:43 PM

## 2020-01-31 NOTE — Telephone Encounter (Signed)
Medication was delivered today from [place].

## 2020-01-31 NOTE — Telephone Encounter (Signed)
Oral Chemotherapy Pharmacist Encounter  Patient reports receiving his Revlimid from [place] today 01/31/20 and he has picked dexamethasone from the pharmacy already.  Patient Education I spoke with patient for overview of new oral chemotherapy medication: Revlimid (lenalidomide) for the treatment of multiple myeloma in conjunction with dexamethasone, planned duration until disease progression or unacceptable drug toxicity.   Counseled patient on administration, dosing, side effects, monitoring, drug-food interactions, safe handling, storage, and disposal. Patient will take 1 capsule (15 mg total) by mouth daily. Take for 21 days then hold for 7 days. Repeat every 28 days.  Side effects include but not limited to: diarrhea or constipation, rash/itchy skin, decreased hgb/plt/wbc, fatigue, N/V.    Reviewed with patient importance of keeping a medication schedule and plan for any missed doses.  After discussion with patient no patient barriers to medication adherence identified.   Steven [name] voiced understanding and appreciation. All questions answered. Medication handout placed in the mail.  Provided patient with [facility] phone number. Patient knows to call the office with questions or concerns. Oral Chemotherapy Navigation Clinic will continue to follow.  Darl Pikes, PharmD, BCPS, BCOP, CPP Hematology/Oncology Clinical Pharmacist Practitioner [facility] [place] [facility] [phone]  01/31/2020 4:23 PM

## 2020-02-01 NOTE — Telephone Encounter (Signed)
Oral Chemotherapy Pharmacist Encounter   During patient education, patient reported he was only taking his aspirin 325mg  once weekly. Messaged Dr. Delton Coombes to ask if her would like him to take the 325mg  daily or take 81mg  daily since he is currently only taking high dose aspirin once a week. He wants Mr. Gammel to take 81mg  daily.  Called patient to let him know, he started his understanding of the change in the aspirin dose and frequency. He plans on picking up aspirin 81 mg today.   Darl Pikes, PharmD, BCPS, BCOP, CPP Hematology/Oncology Clinical Pharmacist [facility] [place] [facility] [phone]  02/01/2020 9:08 AM

## 2020-02-02 ENCOUNTER — Ambulatory Visit (HOSPITAL_COMMUNITY): Payer: Medicare Other | Admitting: Hematology

## 2020-02-14 ENCOUNTER — Encounter (HOSPITAL_COMMUNITY): Payer: Self-pay

## 2020-02-14 ENCOUNTER — Inpatient Hospital Stay (HOSPITAL_COMMUNITY): Payer: Medicare Other | Attending: Hematology

## 2020-02-14 ENCOUNTER — Other Ambulatory Visit: Payer: Self-pay

## 2020-02-14 DIAGNOSIS — C9001 Multiple myeloma in remission: Secondary | ICD-10-CM

## 2020-02-14 DIAGNOSIS — Z452 Encounter for adjustment and management of vascular access device: Secondary | ICD-10-CM | POA: Insufficient documentation

## 2020-02-14 DIAGNOSIS — C9002 Multiple myeloma in relapse: Secondary | ICD-10-CM | POA: Diagnosis not present

## 2020-02-14 LAB — CBC WITH DIFFERENTIAL/PLATELET
Abs Immature Granulocytes: 0.03 10*3/uL (ref 0.00–0.07)
Basophils Absolute: 0 10*3/uL (ref 0.0–0.1)
Basophils Relative: 0 %
Eosinophils Absolute: 0 10*3/uL (ref 0.0–0.5)
Eosinophils Relative: 1 %
HCT: 41.3 % (ref 39.0–52.0)
Hemoglobin: 12.4 g/dL — ABNORMAL LOW (ref 13.0–17.0)
Immature Granulocytes: 1 %
Lymphocytes Relative: 23 %
Lymphs Abs: 1.1 10*3/uL (ref 0.7–4.0)
MCH: 23.8 pg — ABNORMAL LOW (ref 26.0–34.0)
MCHC: 30 g/dL (ref 30.0–36.0)
MCV: 79.1 fL — ABNORMAL LOW (ref 80.0–100.0)
Monocytes Absolute: 0.2 10*3/uL (ref 0.1–1.0)
Monocytes Relative: 4 %
Neutro Abs: 3.3 10*3/uL (ref 1.7–7.7)
Neutrophils Relative %: 71 %
Platelets: 43 10*3/uL — ABNORMAL LOW (ref 150–400)
RBC: 5.22 MIL/uL (ref 4.22–5.81)
RDW: 17.2 % — ABNORMAL HIGH (ref 11.5–15.5)
WBC: 4.7 10*3/uL (ref 4.0–10.5)
nRBC: 0 % (ref 0.0–0.2)

## 2020-02-14 LAB — COMPREHENSIVE METABOLIC PANEL
ALT: 15 U/L (ref 0–44)
AST: 17 U/L (ref 15–41)
Albumin: 4 g/dL (ref 3.5–5.0)
Alkaline Phosphatase: 40 U/L (ref 38–126)
Anion gap: 9 (ref 5–15)
BUN: 10 mg/dL (ref 6–20)
CO2: 23 mmol/L (ref 22–32)
Calcium: 8.6 mg/dL — ABNORMAL LOW (ref 8.9–10.3)
Chloride: 106 mmol/L (ref 98–111)
Creatinine, Ser: 0.96 mg/dL (ref 0.61–1.24)
GFR calc Af Amer: >60 mL/min (ref >60)
GFR calc non Af Amer: >60 mL/min (ref >60)
Glucose, Bld: 109 mg/dL — ABNORMAL HIGH (ref 70–99)
Potassium: 3.8 mmol/L (ref 3.5–5.1)
Sodium: 138 mmol/L (ref 135–145)
Total Bilirubin: 1.6 mg/dL — ABNORMAL HIGH (ref 0.3–1.2)
Total Protein: 7.2 g/dL (ref 6.5–8.1)

## 2020-02-14 LAB — LACTATE DEHYDROGENASE: LDH: 124 U/L (ref 98–192)

## 2020-02-14 MED ORDER — HEPARIN SOD (PORK) LOCK FLUSH 100 UNIT/ML IV SOLN
500.0000 [IU] | Freq: Once | INTRAVENOUS | Status: AC
  Administered 2020-02-14: 500 [IU] via INTRAVENOUS

## 2020-02-14 MED ORDER — SODIUM CHLORIDE 0.9% FLUSH
20.0000 mL | INTRAVENOUS | Status: DC | PRN
  Administered 2020-02-14: 20 mL via INTRAVENOUS

## 2020-02-14 NOTE — Progress Notes (Signed)
Steven [name] tolerated portacath lab draw with flush well without complaints or incident. VSS Port accessed with 20 gauge needle with blood drawn for labs ordered then flushed easily per protocol and de-accessed. Pt discharged self ambulatory in satisfactory condition

## 2020-02-14 NOTE — Patient Instructions (Signed)
[place] at [facility] Discharge Instructions  Labs drawn from portacath today and flushed per protocol. Follow-up as scheduled   Thank you for choosing [place] at [facility] to provide your oncology and hematology care.  To afford each patient quality time with our provider, please arrive at least 15 minutes before your scheduled appointment time.   If you have a lab appointment with the [place] please come in thru the Main Entrance and check in at the main information desk.  You need to re-schedule your appointment should you arrive 10 or more minutes late.  We strive to give you quality time with our providers, and arriving late affects you and other patients whose appointments are after yours.  Also, if you no show three or more times for appointments you may be dismissed from the clinic at the providers discretion.     Again, thank you for choosing [facility].  Our hope is that these requests will decrease the amount of time that you wait before being seen by our physicians.       _____________________________________________________________  Should you have questions after your visit to [facility], please contact our office at [phone] and follow the prompts.  Our office hours are 8:00 a.m. and 4:30 p.m. Monday - Friday.  Please note that voicemails left after 4:00 p.m. may not be returned until the following business day.  We are closed weekends and major holidays.  You do have access to a nurse 24-7, just call the main number to the clinic [phone] and do not press any options, hold on the line and a nurse will answer the phone.    For prescription refill requests, have your pharmacy contact our office and allow 72 hours.    Due to Covid, you will need to wear a mask upon entering the hospital. If you do not have a mask, a mask will be given to you at the Main Entrance upon arrival. For doctor  visits, patients may have 1 support person age 52 or older with them. For treatment visits, patients can not have anyone with them due to social distancing guidelines and our immunocompromised population.

## 2020-02-15 LAB — KAPPA/LAMBDA LIGHT CHAINS
Kappa free light chain: 73.1 mg/L — ABNORMAL HIGH (ref 3.3–19.4)
Kappa, lambda light chain ratio: 6.53 — ABNORMAL HIGH (ref 0.26–1.65)
Lambda free light chains: 11.2 mg/L (ref 5.7–26.3)

## 2020-02-16 ENCOUNTER — Ambulatory Visit (HOSPITAL_COMMUNITY): Payer: Medicare Other | Admitting: Hematology

## 2020-02-16 ENCOUNTER — Inpatient Hospital Stay (HOSPITAL_COMMUNITY): Payer: Medicare Other | Attending: Hematology | Admitting: Hematology

## 2020-02-16 ENCOUNTER — Other Ambulatory Visit: Payer: Self-pay

## 2020-02-16 VITALS — BP 141/85 | HR 68 | Temp 96.9°F | Resp 18 | Wt 174.6 lb

## 2020-02-16 DIAGNOSIS — G8929 Other chronic pain: Secondary | ICD-10-CM | POA: Diagnosis not present

## 2020-02-16 DIAGNOSIS — Z7982 Long term (current) use of aspirin: Secondary | ICD-10-CM | POA: Diagnosis not present

## 2020-02-16 DIAGNOSIS — D696 Thrombocytopenia, unspecified: Secondary | ICD-10-CM | POA: Insufficient documentation

## 2020-02-16 DIAGNOSIS — C9001 Multiple myeloma in remission: Secondary | ICD-10-CM

## 2020-02-16 DIAGNOSIS — C9002 Multiple myeloma in relapse: Secondary | ICD-10-CM | POA: Diagnosis not present

## 2020-02-16 NOTE — Progress Notes (Signed)
[place], [place] [address]   CLINIC:  Medical Oncology/Hematology  PCP:  Monico Blitz, MD [address] [place] [address]  [phone]  REASON FOR VISIT:  Follow-up for multiple myeloma  PRIOR THERAPY:  1. 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 2. Revlimid maintenance until 2015.  CURRENT THERAPY: Revlimid and steroids  INTERVAL HISTORY:  [name], a 59 y.o. male, returns for routine follow-up for his multiple myeloma. Wesam was last seen on 01/09/2020.  Today he is accompanied by his wife. He reports that once he started taking the Decadron the pain in his back and feet has improved; he takes 5 tablets weekly. He started the Revlimid on 8/16 and he is tolerating it well. His appetite is good.    REVIEW OF SYSTEMS:  Review of Systems  Constitutional: Negative for appetite change and fatigue.  Cardiovascular: Negative for leg swelling.  Gastrointestinal: Positive for diarrhea.  All other systems reviewed and are negative.   PAST MEDICAL/SURGICAL HISTORY:  Past Medical History:  Diagnosis Date  . Multiple myeloma in remission ([place]) 11/26/2005   No past surgical history on file.  SOCIAL HISTORY:  Social History   Socioeconomic History  . Marital status: Legally Separated    Spouse name: Not on file  . Number of children: Not on file  . Years of education: Not on file  . Highest education level: Not on file  Occupational History  . Not on file  Tobacco Use  . Smoking status: Never Smoker  . Smokeless tobacco: Never Used  Vaping Use  . Vaping Use: Never used  Substance and Sexual Activity  . Alcohol use: No  . Drug use: No  . Sexual activity: Not on file  Other Topics Concern  . Not on file  Social History Narrative  . Not on file   Social Determinants of Health   Financial Resource Strain:   . Difficulty of Paying Living Expenses: Not on file  Food Insecurity:   . Worried About Ship broker in the Last Year: Not on file  . Ran Out of Food in the Last Year: Not on file  Transportation Needs:   . Lack of Transportation (Medical): Not on file  . Lack of Transportation (Non-Medical): Not on file  Physical Activity:   . Days of Exercise per Week: Not on file  . Minutes of Exercise per Session: Not on file  Stress:   . Feeling of Stress : Not on file  Social Connections:   . Frequency of Communication with Friends and Family: Not on file  . Frequency of Social Gatherings with Friends and Family: Not on file  . Attends Religious Services: Not on file  . Active Member of Clubs or Organizations: Not on file  . Attends Archivist Meetings: Not on file  . Marital Status: Not on file  Intimate Partner Violence:   . Fear of Current or Ex-Partner: Not on file  . Emotionally Abused: Not on file  . Physically Abused: Not on file  . Sexually Abused: Not on file    FAMILY HISTORY:  Family History  Problem Relation Age of Onset  . Diabetes Mother   . Cancer Sister     CURRENT MEDICATIONS:  Current Outpatient Medications  Medication Sig Dispense Refill  . ASPIRIN 81 PO Take 81 mg by mouth daily.    . calcium citrate-vitamin D (CITRACAL+D) 315-200 MG-UNIT tablet Take 1  tablet by mouth daily.     . chlorhexidine (PERIDEX) 0.12 % solution USE AS DIRECTED 15 MLS IN THE MOUTH OR THROAT 2 (TWO) TIMES DAILY. 473 mL 2  . dexamethasone (DECADRON) 4 MG tablet Take 5 tablets (20 mg total) by mouth once a week. 20 tablet 3  . folic acid (FOLVITE) 1 MG tablet Take 1 mg by mouth.    Marland Kitchen KLOR-CON M20 20 MEQ tablet TAKE 1 TABLET BY MOUTH EVERY DAY 90 tablet 1  . lenalidomide (REVLIMID) 15 MG capsule Take 1 capsule (15 mg total) by mouth daily. Celgene Auth # 21 capsule 0  . methadone (DOLOPHINE) 10 MG tablet Take 1 tablet (10 mg total) by mouth every 12 (twelve) hours. 60 tablet 0  . polyethylene glycol (MIRALAX / GLYCOLAX) packet Take 17 g by mouth daily as needed.     .  Pramox-PE-Glycerin-Petrolatum (PREPARATION H) 1-0.25-14.4-15 % CREA Place rectally.    . lidocaine-prilocaine (EMLA) cream Apply 1 application topically as needed.  (Patient not taking: Reported on 02/16/2020)     No current facility-administered medications for this visit.   Facility-Administered Medications Ordered in Other Visits  Medication Dose Route Frequency Provider Last Rate Last Admin  . sodium chloride flush (NS) 0.9 % injection 10 mL  10 mL Intravenous PRN Baird Cancer, PA-C   10 mL at 06/19/17 1029    ALLERGIES:  No Known Allergies  PHYSICAL EXAM:  Performance status (ECOG): 1 - Symptomatic but completely ambulatory  Vitals:   02/16/20 1511  BP: (!) 141/85  Pulse: 68  Resp: 18  Temp: (!) 96.9 F (36.1 C)  SpO2: 100%   Wt Readings from Last 3 Encounters:  02/16/20 174 lb 9.7 oz (79.2 kg)  01/09/20 174 lb 14.4 oz (79.3 kg)  12/20/19 173 lb 9.6 oz (78.7 kg)   Physical Exam Vitals reviewed.  Constitutional:      Appearance: Normal appearance.  Cardiovascular:     Rate and Rhythm: Normal rate and regular rhythm.     Pulses: Normal pulses.     Heart sounds: Normal heart sounds.  Pulmonary:     Effort: Pulmonary effort is normal.     Breath sounds: Normal breath sounds.  Chest:     Comments: Port-a-Cath in R chest Musculoskeletal:     Right lower leg: No edema.     Left lower leg: No edema.  Neurological:     General: No focal deficit present.     Mental Status: He is alert and oriented to person, place, and time.  Psychiatric:        Mood and Affect: Mood normal.        Behavior: Behavior normal.     LABORATORY DATA:  I have reviewed the labs as listed.  CBC Latest Ref Rng & Units 02/14/2020 01/02/2020 12/02/2019  WBC 4.0 - 10.5 K/uL 4.7 4.6 4.0  Hemoglobin 13.0 - 17.0 g/dL 12.4(L) 12.0(L) 14.0  Hematocrit 39 - 52 % 41.3 39.0 46.3  Platelets 150 - 400 K/uL 43(L) 110(L) 131(L)   CMP Latest Ref Rng & Units 02/14/2020 01/02/2020 09/12/2019  Glucose 70  - 99 mg/dL 109(H) 93 96  BUN 6 - 20 mg/dL 10 14 15   Creatinine 0.61 - 1.24 mg/dL 0.96 0.89 0.83  Sodium 135 - 145 mmol/L 138 138 138  Potassium 3.5 - 5.1 mmol/L 3.8 3.7 3.7  Chloride 98 - 111 mmol/L 106 106 107  CO2 22 - 32 mmol/L 23 25 23   Calcium 8.9 - 10.3  mg/dL 8.6(L) 8.6(L) 8.7(L)  Total Protein 6.5 - 8.1 g/dL 7.2 7.1 7.1  Total Bilirubin 0.3 - 1.2 mg/dL 1.6(H) 1.0 1.3(H)  Alkaline Phos 38 - 126 U/L 40 41 45  AST 15 - 41 U/L 17 18 20   ALT 0 - 44 U/L 15 16 16       Component Value Date/Time   RBC 5.22 02/14/2020 1006   MCV 79.1 (L) 02/14/2020 1006   MCH 23.8 (L) 02/14/2020 1006   MCHC 30.0 02/14/2020 1006   RDW 17.2 (H) 02/14/2020 1006   LYMPHSABS 1.1 02/14/2020 1006   MONOABS 0.2 02/14/2020 1006   EOSABS 0.0 02/14/2020 1006   BASOSABS 0.0 02/14/2020 1006   Lab Results  Component Value Date   LDH 124 02/14/2020   LDH 128 01/02/2020   LDH 137 09/12/2019   Lab Results  Component Value Date   TOTALPROTELP 6.5 01/02/2020   ALBUMINELP 3.7 01/02/2020   A1GS 0.2 01/02/2020   A2GS 0.5 01/02/2020   BETS 1.1 01/02/2020   GAMS 1.1 01/02/2020   MSPIKE Not Observed 01/02/2020   SPEI Comment 01/02/2020    Lab Results  Component Value Date   KPAFRELGTCHN 73.1 (H) 02/14/2020   LAMBDASER 11.2 02/14/2020   KAPLAMBRATIO 6.53 (H) 02/14/2020    DIAGNOSTIC IMAGING:  I have independently reviewed the scans and discussed with the patient. No results found.   ASSESSMENT:  1. Relapsed kappa light chain multiple myeloma: -Diagnosis on 06/05/2003, presentation with T12 vertebral body fracture, kappa light chain myeloma diagnosed with 2.03 g of light chains/24-hour urine. -06/09/2003 -06/22/2003 radiation 19 Gray total T11-L1, 19 grade T2-T4 for T3 vertebral body involvement. -06/26/2003 chemotherapy with Doxil, Velcade and dexamethasone. 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 -06/22/2008 relapse of myeloma with kappa light chains and 24-hour urine found, treatment initiated with Revlimid/dexamethasone from 07/31/2008 through 08/10/2013. -Radiation therapy 25 centigrade to right hip for impending right femoral neck fracture from 08/27/2010 through 09/10/2010. -PET scan on 09/12/2019 did not show any myeloma lesions. -SPEP and immunofixation on 09/12/2019 was normal. Kappa light chains have increased to 48. Ratio has gone up to 4.32 and steadily increasing. -Creatinine and calcium were normal. LDH was normal. -24-hour urine shows no M spike. Immunofixation was normal. Elevated light chain ratio present. -Bone marrow biopsy on 12/02/2019 shows normocellular marrow with plasma cells ranging from 10-20%. Chromosome analysis was normal.  FISH panel was negative for high-risk abnormalities. -Skeletal survey on 01/02/2020 shows possible new or better demonstrated lucencies in the T5 and T6 vertebral bodies. -Labs on 01/02/2020 shows kappa light chains 118.7, ratio of 11.2, uptrending.  SPEP is negative.   PLAN:  1. Relapsed multiple myeloma: -I have reviewed his kappa light chains and free light chain ratio were trending up, we restarted treatment. -He started Revlimid 15 mg 3 weeks on 1 week off with weekly dexamethasone 20 mg on 01/30/2020.  So far he has tolerated very well. -I have reviewed his blood work.  Total bilirubin is slightly high at 1.6.  LDH was normal.  Other LFTs were normal. -CBC showed platelet count of 43.  White count is 4.7. -He will restart his cycle 2 on 02/27/2020.  I will see him back on 03/26/2020 with myeloma labs done 1 week prior.  2. Chronic mid back pain: -Continue methadone 10 mg every 12 hours.  Pain well controlled.  3. Mild thrombocytopenia: -Mild  thrombocytopenia since 2017 was stable.  Today platelet count is 43 from Revlimid.  4.  Thromboprophylaxis: -Continue aspirin 81 mg  daily.  Orders placed this encounter:  No orders of the defined types were placed in this encounter.    Derek Jack, MD [facility] [phone]   I, Milinda Antis, am acting as a scribe for Dr. Sanda Linger.  I, Derek Jack MD, have reviewed the above documentation for accuracy and completeness, and I agree with the above.

## 2020-02-16 NOTE — Patient Instructions (Signed)
[place] at [facility] Discharge Instructions  You were seen today by Dr. Delton Coombes. He went over your recent results. Dr. Delton Coombes will see you back on October 11 for labs and follow up.   Thank you for choosing [place] at [facility] to provide your oncology and hematology care.  To afford each patient quality time with our provider, please arrive at least 15 minutes before your scheduled appointment time.   If you have a lab appointment with the [facility] please come in thru the Main Entrance and check in at the main information desk  You need to re-schedule your appointment should you arrive 10 or more minutes late.  We strive to give you quality time with our providers, and arriving late affects you and other patients whose appointments are after yours.  Also, if you no show three or more times for appointments you may be dismissed from the clinic at the providers discretion.     Again, thank you for choosing [facility].  Our hope is that these requests will decrease the amount of time that you wait before being seen by our physicians.       _____________________________________________________________  Should you have questions after your visit to [facility], please contact our office at [phone] between the hours of 8:00 a.m. and 4:30 p.m.  Voicemails left after 4:00 p.m. will not be returned until the following business day.  For prescription refill requests, have your pharmacy contact our office and allow 72 hours.    Cancer Center Support Programs:   > Cancer Support Group  2nd Tuesday of the month 1pm-2pm, Journey Room

## 2020-02-17 LAB — IMMUNOFIXATION ELECTROPHORESIS
IgA: 254 mg/dL (ref 90–386)
IgG (Immunoglobin G), Serum: 920 mg/dL (ref 603–1613)
IgM (Immunoglobulin M), Srm: 93 mg/dL (ref 20–172)
Total Protein ELP: 6.8 g/dL (ref 6.0–8.5)

## 2020-02-19 ENCOUNTER — Other Ambulatory Visit (HOSPITAL_COMMUNITY): Payer: Self-pay | Admitting: Hematology

## 2020-02-19 DIAGNOSIS — C9002 Multiple myeloma in relapse: Secondary | ICD-10-CM

## 2020-02-21 NOTE — Telephone Encounter (Signed)
Chart reviewed. Revlimid refilled per Dr. Delton Coombes.

## 2020-02-22 ENCOUNTER — Other Ambulatory Visit (HOSPITAL_COMMUNITY): Payer: Self-pay | Admitting: *Deleted

## 2020-02-22 DIAGNOSIS — G8929 Other chronic pain: Secondary | ICD-10-CM

## 2020-02-22 DIAGNOSIS — C9001 Multiple myeloma in remission: Secondary | ICD-10-CM

## 2020-02-22 MED ORDER — METHADONE HCL 10 MG PO TABS: 10.0000 mg | ORAL_TABLET | Freq: Two times a day (BID) | ORAL | 0 refills | Status: DC

## 2020-03-19 ENCOUNTER — Other Ambulatory Visit: Payer: Self-pay

## 2020-03-19 ENCOUNTER — Encounter (HOSPITAL_COMMUNITY): Payer: Self-pay

## 2020-03-19 ENCOUNTER — Inpatient Hospital Stay (HOSPITAL_COMMUNITY): Payer: Medicare Other | Attending: Hematology

## 2020-03-19 DIAGNOSIS — D696 Thrombocytopenia, unspecified: Secondary | ICD-10-CM | POA: Diagnosis not present

## 2020-03-19 DIAGNOSIS — Z452 Encounter for adjustment and management of vascular access device: Secondary | ICD-10-CM | POA: Insufficient documentation

## 2020-03-19 DIAGNOSIS — G8929 Other chronic pain: Secondary | ICD-10-CM | POA: Diagnosis not present

## 2020-03-19 DIAGNOSIS — Z7982 Long term (current) use of aspirin: Secondary | ICD-10-CM | POA: Diagnosis not present

## 2020-03-19 DIAGNOSIS — C9002 Multiple myeloma in relapse: Secondary | ICD-10-CM | POA: Insufficient documentation

## 2020-03-19 DIAGNOSIS — C9001 Multiple myeloma in remission: Secondary | ICD-10-CM

## 2020-03-19 MED ORDER — HEPARIN SOD (PORK) LOCK FLUSH 100 UNIT/ML IV SOLN
500.0000 [IU] | Freq: Once | INTRAVENOUS | Status: AC
  Administered 2020-03-19: 500 [IU] via INTRAVENOUS

## 2020-03-19 MED ORDER — STERILE WATER FOR INJECTION IJ SOLN
INTRAMUSCULAR | Status: AC
  Filled 2020-03-19: qty 10

## 2020-03-19 MED ORDER — ALTEPLASE 2 MG IJ SOLR
2.0000 mg | Freq: Once | INTRAMUSCULAR | Status: AC
  Administered 2020-03-19: 2 mg
  Filled 2020-03-19: qty 2

## 2020-03-19 MED ORDER — SODIUM CHLORIDE 0.9% FLUSH
20.0000 mL | INTRAVENOUS | Status: DC | PRN
  Administered 2020-03-19: 20 mL via INTRAVENOUS

## 2020-03-19 NOTE — Progress Notes (Signed)
Steven [name] tolerated procedure well without complaints Port was accessed with no blood return noted although the portacath was flushed easily without complaints of pain or swelling. Alteplase was used per protocol but still no blood return noted after 30 minute and 1 hour intervals and unable to pull back any Alteplase at this time either Pt wants to return tomorrow to try again so Portacath was de-accessed and pt was discharged self ambulatory in satisfactory condition

## 2020-03-19 NOTE — Patient Instructions (Signed)
[place] at [facility] Discharge Instructions  Alteplase used in portacath today for declotting purposes   Thank you for choosing [place] at [facility] to provide your oncology and hematology care.  To afford each patient quality time with our provider, please arrive at least 15 minutes before your scheduled appointment time.   If you have a lab appointment with the [facility] please come in thru the Main Entrance and check in at the main information desk.  You need to re-schedule your appointment should you arrive 10 or more minutes late.  We strive to give you quality time with our providers, and arriving late affects you and other patients whose appointments are after yours.  Also, if you no show three or more times for appointments you may be dismissed from the clinic at the providers discretion.     Again, thank you for choosing [facility].  Our hope is that these requests will decrease the amount of time that you wait before being seen by our physicians.       _____________________________________________________________  Should you have questions after your visit to [facility], please contact our office at [phone] and follow the prompts.  Our office hours are 8:00 a.m. and 4:30 p.m. Monday - Friday.  Please note that voicemails left after 4:00 p.m. may not be returned until the following business day.  We are closed weekends and major holidays.  You do have access to a nurse 24-7, just call the main number to the clinic [phone] and do not press any options, hold on the line and a nurse will answer the phone.    For prescription refill requests, have your pharmacy contact our office and allow 72 hours.    Due to Covid, you will need to wear a mask upon entering the hospital. If you do not have a mask, a mask will be given to you at the Main Entrance upon arrival. For doctor visits, patients may  have 1 support person age 59 or older with them. For treatment visits, patients can not have anyone with them due to social distancing guidelines and our immunocompromised population.

## 2020-03-20 ENCOUNTER — Other Ambulatory Visit: Payer: Self-pay

## 2020-03-20 ENCOUNTER — Inpatient Hospital Stay (HOSPITAL_COMMUNITY): Payer: Medicare Other

## 2020-03-20 ENCOUNTER — Other Ambulatory Visit (HOSPITAL_COMMUNITY): Payer: Self-pay | Admitting: Hematology

## 2020-03-20 ENCOUNTER — Encounter (HOSPITAL_COMMUNITY): Payer: Self-pay

## 2020-03-20 DIAGNOSIS — C9002 Multiple myeloma in relapse: Secondary | ICD-10-CM

## 2020-03-20 DIAGNOSIS — D696 Thrombocytopenia, unspecified: Secondary | ICD-10-CM | POA: Diagnosis not present

## 2020-03-20 DIAGNOSIS — C9001 Multiple myeloma in remission: Secondary | ICD-10-CM

## 2020-03-20 DIAGNOSIS — G8929 Other chronic pain: Secondary | ICD-10-CM | POA: Diagnosis not present

## 2020-03-20 DIAGNOSIS — Z7982 Long term (current) use of aspirin: Secondary | ICD-10-CM | POA: Diagnosis not present

## 2020-03-20 DIAGNOSIS — Z452 Encounter for adjustment and management of vascular access device: Secondary | ICD-10-CM | POA: Diagnosis not present

## 2020-03-20 LAB — CBC WITH DIFFERENTIAL/PLATELET
Abs Immature Granulocytes: 0.02 10*3/uL (ref 0.00–0.07)
Basophils Absolute: 0 10*3/uL (ref 0.0–0.1)
Basophils Relative: 1 %
Eosinophils Absolute: 0.1 10*3/uL (ref 0.0–0.5)
Eosinophils Relative: 1 %
HCT: 39.4 % (ref 39.0–52.0)
Hemoglobin: 12.4 g/dL — ABNORMAL LOW (ref 13.0–17.0)
Immature Granulocytes: 0 %
Lymphocytes Relative: 21 %
Lymphs Abs: 1.1 10*3/uL (ref 0.7–4.0)
MCH: 24.6 pg — ABNORMAL LOW (ref 26.0–34.0)
MCHC: 31.5 g/dL (ref 30.0–36.0)
MCV: 78.2 fL — ABNORMAL LOW (ref 80.0–100.0)
Monocytes Absolute: 0.2 10*3/uL (ref 0.1–1.0)
Monocytes Relative: 4 %
Neutro Abs: 3.8 10*3/uL (ref 1.7–7.7)
Neutrophils Relative %: 73 %
Platelets: 117 10*3/uL — ABNORMAL LOW (ref 150–400)
RBC: 5.04 MIL/uL (ref 4.22–5.81)
RDW: 17.8 % — ABNORMAL HIGH (ref 11.5–15.5)
WBC: 5.3 10*3/uL (ref 4.0–10.5)
nRBC: 0 % (ref 0.0–0.2)

## 2020-03-20 LAB — COMPREHENSIVE METABOLIC PANEL
ALT: 15 U/L (ref 0–44)
AST: 16 U/L (ref 15–41)
Albumin: 3.9 g/dL (ref 3.5–5.0)
Alkaline Phosphatase: 40 U/L (ref 38–126)
Anion gap: 8 (ref 5–15)
BUN: 11 mg/dL (ref 6–20)
CO2: 26 mmol/L (ref 22–32)
Calcium: 8.7 mg/dL — ABNORMAL LOW (ref 8.9–10.3)
Chloride: 106 mmol/L (ref 98–111)
Creatinine, Ser: 1.08 mg/dL (ref 0.61–1.24)
GFR calc non Af Amer: >60 mL/min (ref >60)
Glucose, Bld: 112 mg/dL — ABNORMAL HIGH (ref 70–99)
Potassium: 4 mmol/L (ref 3.5–5.1)
Sodium: 140 mmol/L (ref 135–145)
Total Bilirubin: 1.5 mg/dL — ABNORMAL HIGH (ref 0.3–1.2)
Total Protein: 6.3 g/dL — ABNORMAL LOW (ref 6.5–8.1)

## 2020-03-20 LAB — LACTATE DEHYDROGENASE: LDH: 127 U/L (ref 98–192)

## 2020-03-20 MED ORDER — HEPARIN SOD (PORK) LOCK FLUSH 100 UNIT/ML IV SOLN
500.0000 [IU] | Freq: Once | INTRAVENOUS | Status: AC
  Administered 2020-03-20: 500 [IU] via INTRAVENOUS

## 2020-03-20 MED ORDER — SODIUM CHLORIDE 0.9% FLUSH
20.0000 mL | INTRAVENOUS | Status: DC | PRN
  Administered 2020-03-20: 20 mL via INTRAVENOUS

## 2020-03-20 NOTE — Progress Notes (Signed)
Steven [name] tolerated portacath flush well without complaints or incident. Port accessed with 20 gauge needle but unable to obtain blood for labs ordered but flushed easily per protocol and de-accessed. Pt sent downstairs to lab for peripheral draw. VSS Pt discharged self ambulatory in satisfactory condition

## 2020-03-20 NOTE — Patient Instructions (Signed)
[place] at [facility] Discharge Instructions  Portacath flushed today unable to draw blood for labs. Follow-up as scheduled   Thank you for choosing [place] at [facility] to provide your oncology and hematology care.  To afford each patient quality time with our provider, please arrive at least 15 minutes before your scheduled appointment time.   If you have a lab appointment with the [facility] please come in thru the Main Entrance and check in at the main information desk.  You need to re-schedule your appointment should you arrive 10 or more minutes late.  We strive to give you quality time with our providers, and arriving late affects you and other patients whose appointments are after yours.  Also, if you no show three or more times for appointments you may be dismissed from the clinic at the providers discretion.     Again, thank you for choosing [facility].  Our hope is that these requests will decrease the amount of time that you wait before being seen by our physicians.       _____________________________________________________________  Should you have questions after your visit to [facility], please contact our office at [phone] and follow the prompts.  Our office hours are 8:00 a.m. and 4:30 p.m. Monday - Friday.  Please note that voicemails left after 4:00 p.m. may not be returned until the following business day.  We are closed weekends and major holidays.  You do have access to a nurse 24-7, just call the main number to the clinic [phone] and do not press any options, hold on the line and a nurse will answer the phone.    For prescription refill requests, have your pharmacy contact our office and allow 72 hours.    Due to Covid, you will need to wear a mask upon entering the hospital. If you do not have a mask, a mask will be given to you at the Main Entrance upon arrival. For doctor  visits, patients may have 1 support person age 82 or older with them. For treatment visits, patients can not have anyone with them due to social distancing guidelines and our immunocompromised population.

## 2020-03-21 LAB — KAPPA/LAMBDA LIGHT CHAINS
Kappa free light chain: 43.7 mg/L — ABNORMAL HIGH (ref 3.3–19.4)
Kappa, lambda light chain ratio: 3.74 — ABNORMAL HIGH (ref 0.26–1.65)
Lambda free light chains: 11.7 mg/L (ref 5.7–26.3)

## 2020-03-21 NOTE — Telephone Encounter (Signed)
Chart reviewed. Revlimid refilled per Dr. Delton Coombes.

## 2020-03-22 LAB — PROTEIN ELECTROPHORESIS, SERUM
A/G Ratio: 1.5 (ref 0.7–1.7)
Albumin ELP: 3.8 g/dL (ref 2.9–4.4)
Alpha-1-Globulin: 0.2 g/dL (ref 0.0–0.4)
Alpha-2-Globulin: 0.6 g/dL (ref 0.4–1.0)
Beta Globulin: 1.1 g/dL (ref 0.7–1.3)
Gamma Globulin: 0.7 g/dL (ref 0.4–1.8)
Globulin, Total: 2.5 g/dL (ref 2.2–3.9)
Total Protein ELP: 6.3 g/dL (ref 6.0–8.5)

## 2020-03-23 ENCOUNTER — Other Ambulatory Visit (HOSPITAL_COMMUNITY): Payer: Self-pay | Admitting: *Deleted

## 2020-03-23 DIAGNOSIS — C9001 Multiple myeloma in remission: Secondary | ICD-10-CM

## 2020-03-23 DIAGNOSIS — G8929 Other chronic pain: Secondary | ICD-10-CM

## 2020-03-23 DIAGNOSIS — M546 Pain in thoracic spine: Secondary | ICD-10-CM

## 2020-03-23 LAB — IMMUNOFIXATION ELECTROPHORESIS
IgA: 222 mg/dL (ref 90–386)
IgG (Immunoglobin G), Serum: 757 mg/dL (ref 603–1613)
IgM (Immunoglobulin M), Srm: 67 mg/dL (ref 20–172)
Total Protein ELP: 6.2 g/dL (ref 6.0–8.5)

## 2020-03-26 ENCOUNTER — Other Ambulatory Visit (HOSPITAL_COMMUNITY): Payer: Self-pay

## 2020-03-26 ENCOUNTER — Inpatient Hospital Stay (HOSPITAL_BASED_OUTPATIENT_CLINIC_OR_DEPARTMENT_OTHER): Payer: Medicare Other | Admitting: Hematology

## 2020-03-26 ENCOUNTER — Other Ambulatory Visit: Payer: Self-pay

## 2020-03-26 VITALS — BP 153/75 | HR 69 | Wt 172.3 lb

## 2020-03-26 DIAGNOSIS — G8929 Other chronic pain: Secondary | ICD-10-CM

## 2020-03-26 DIAGNOSIS — Z7982 Long term (current) use of aspirin: Secondary | ICD-10-CM | POA: Diagnosis not present

## 2020-03-26 DIAGNOSIS — C9002 Multiple myeloma in relapse: Secondary | ICD-10-CM | POA: Diagnosis not present

## 2020-03-26 DIAGNOSIS — C9001 Multiple myeloma in remission: Secondary | ICD-10-CM | POA: Diagnosis not present

## 2020-03-26 DIAGNOSIS — Z452 Encounter for adjustment and management of vascular access device: Secondary | ICD-10-CM | POA: Diagnosis not present

## 2020-03-26 DIAGNOSIS — D696 Thrombocytopenia, unspecified: Secondary | ICD-10-CM | POA: Diagnosis not present

## 2020-03-26 MED ORDER — METHADONE HCL 10 MG PO TABS
10.0000 mg | ORAL_TABLET | Freq: Two times a day (BID) | ORAL | 0 refills | Status: DC
Start: 2020-03-26 — End: 2020-04-23

## 2020-03-26 NOTE — Progress Notes (Signed)
  [facility] [address] [place], [place] [address]   CLINIC:  Medical Oncology/Hematology  PCP:  Shah, Ashish, MD [address] / [place] [address]  [phone]  REASON FOR VISIT:  Follow-up for multiple myeloma  PRIOR THERAPY:  1. 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 2. Revlimid maintenance until 2015.  CURRENT THERAPY: Revlimid 3 weeks on/1 week off; Decadron weekly  INTERVAL HISTORY:  [name], a 59 y.o. male, returns for routine follow-up for his multiple myeloma. Steven [name] was last seen on 02/16/2020.  Today he is accompanied by his wife. He reports feeling well. He is tolerating the Revlimid well; he is currently on his week off and will restart on 10/13. His back pain is improved and he has not taken any methadone for several days. He denies having N/V, though he continues having diarrhea while taking Revlimid, approximately 2 bouts of soft diarrhea daily. He takes 5 tablets of Decadron weekly. He denies having any recent F/C or infections.   REVIEW OF SYSTEMS:  Review of Systems  Constitutional: Negative for appetite change, chills, fatigue and fever.  Gastrointestinal: Positive for diarrhea (2 episodes of soft diarrhea while on Revlimid). Negative for nausea and vomiting.  Musculoskeletal: Positive for back pain (improving).  Neurological: Positive for numbness (feet).  All other systems reviewed and are negative.   PAST MEDICAL/SURGICAL HISTORY:  Past Medical History:  Diagnosis Date  . Multiple myeloma in remission (HCC) 11/26/2005   No past surgical history on file.  SOCIAL HISTORY:  Social History   Socioeconomic History  . Marital status: Legally Separated    Spouse name: Not on file  . Number of children: Not on file  . Years of education: Not on file  . Highest education level: Not on file  Occupational History  . Not on file  Tobacco Use  . Smoking status: Never Smoker  . Smokeless tobacco: Never Used  Vaping  Use  . Vaping Use: Never used  Substance and Sexual Activity  . Alcohol use: No  . Drug use: No  . Sexual activity: Not on file  Other Topics Concern  . Not on file  Social History Narrative  . Not on file   Social Determinants of Health   Financial Resource Strain:   . Difficulty of Paying Living Expenses: Not on file  Food Insecurity:   . Worried About Running Out of Food in the Last Year: Not on file  . Ran Out of Food in the Last Year: Not on file  Transportation Needs:   . Lack of Transportation (Medical): Not on file  . Lack of Transportation (Non-Medical): Not on file  Physical Activity:   . Days of Exercise per Week: Not on file  . Minutes of Exercise per Session: Not on file  Stress:   . Feeling of Stress : Not on file  Social Connections:   . Frequency of Communication with Friends and Family: Not on file  . Frequency of Social Gatherings with Friends and Family: Not on file  . Attends Religious Services: Not on file  . Active Member of Clubs or Organizations: Not on file  . Attends Club or Organization Meetings: Not on file  . Marital Status: Not on file  Intimate Partner Violence:   . Fear of Current or Ex-Partner: Not on file  . Emotionally Abused: Not on file  . Physically Abused: Not on file  . Sexually Abused: Not on file    FAMILY   HISTORY:  Family History  Problem Relation Age of Onset  . Diabetes Mother   . Cancer Sister     CURRENT MEDICATIONS:  Current Outpatient Medications  Medication Sig Dispense Refill  . ASPIRIN 81 PO Take 81 mg by mouth daily.    . calcium citrate-vitamin D (CITRACAL+D) 315-200 MG-UNIT tablet Take 1 tablet by mouth daily.     . chlorhexidine (PERIDEX) 0.12 % solution USE AS DIRECTED 15 MLS IN THE MOUTH OR THROAT 2 (TWO) TIMES DAILY. 473 mL 2  . dexamethasone (DECADRON) 4 MG tablet Take 5 tablets (20 mg total) by mouth once a week. 20 tablet 3  . folic acid (FOLVITE) 1 MG tablet Take 1 mg by mouth.    . KLOR-CON M20 20  MEQ tablet TAKE 1 TABLET BY MOUTH EVERY DAY 90 tablet 1  . methadone (DOLOPHINE) 10 MG tablet Take 1 tablet (10 mg total) by mouth every 12 (twelve) hours. 60 tablet 0  . polyethylene glycol (MIRALAX / GLYCOLAX) packet Take 17 g by mouth daily as needed.     . Pramox-PE-Glycerin-Petrolatum (PREPARATION H) 1-0.25-14.4-15 % CREA Place rectally.    . REVLIMID 15 MG capsule TAKE 1 CAPSULE DAILY FOR 21 DAYS ON AND 7 DAYS OFF. 21 capsule 0  . lidocaine-prilocaine (EMLA) cream Apply 1 application topically as needed.  (Patient not taking: Reported on 03/26/2020)     No current facility-administered medications for this visit.   Facility-Administered Medications Ordered in Other Visits  Medication Dose Route Frequency Provider Last Rate Last Admin  . sodium chloride flush (NS) 0.9 % injection 10 mL  10 mL Intravenous PRN Kefalas, Thomas S, PA-C   10 mL at 06/19/17 1029    ALLERGIES:  No Known Allergies  PHYSICAL EXAM:  Performance status (ECOG): 1 - Symptomatic but completely ambulatory  Vitals:   03/26/20 1603  BP: (!) 153/75  Pulse: 69  SpO2: 100%   Wt Readings from Last 3 Encounters:  03/26/20 172 lb 4.8 oz (78.2 kg)  02/16/20 174 lb 9.7 oz (79.2 kg)  01/09/20 174 lb 14.4 oz (79.3 kg)   Physical Exam Vitals reviewed.  Constitutional:      Appearance: Normal appearance.  Cardiovascular:     Rate and Rhythm: Normal rate and regular rhythm.     Pulses: Normal pulses.     Heart sounds: Normal heart sounds.  Pulmonary:     Effort: Pulmonary effort is normal.     Breath sounds: Normal breath sounds.  Neurological:     General: No focal deficit present.     Mental Status: He is alert and oriented to person, place, and time.  Psychiatric:        Mood and Affect: Mood normal.        Behavior: Behavior normal.     LABORATORY DATA:  I have reviewed the labs as listed.  CBC Latest Ref Rng & Units 03/20/2020 02/14/2020 01/02/2020  WBC 4.0 - 10.5 K/uL 5.3 4.7 4.6  Hemoglobin 13.0 -  17.0 g/dL 12.4(L) 12.4(L) 12.0(L)  Hematocrit 39 - 52 % 39.4 41.3 39.0  Platelets 150 - 400 K/uL 117(L) 43(L) 110(L)   CMP Latest Ref Rng & Units 03/20/2020 02/14/2020 01/02/2020  Glucose 70 - 99 mg/dL 112(H) 109(H) 93  BUN 6 - 20 mg/dL 11 10 14  Creatinine 0.61 - 1.24 mg/dL 1.08 0.96 0.89  Sodium 135 - 145 mmol/L 140 138 138  Potassium 3.5 - 5.1 mmol/L 4.0 3.8 3.7  Chloride 98 - 111 mmol/L   106 106 106  CO2 22 - 32 mmol/L 26 23 25  Calcium 8.9 - 10.3 mg/dL 8.7(L) 8.6(L) 8.6(L)  Total Protein 6.5 - 8.1 g/dL 6.3(L) 7.2 7.1  Total Bilirubin 0.3 - 1.2 mg/dL 1.5(H) 1.6(H) 1.0  Alkaline Phos 38 - 126 U/L 40 40 41  AST 15 - 41 U/L 16 17 18  ALT 0 - 44 U/L 15 15 16      Component Value Date/Time   RBC 5.04 03/20/2020 1022   MCV 78.2 (L) 03/20/2020 1022   MCH 24.6 (L) 03/20/2020 1022   MCHC 31.5 03/20/2020 1022   RDW 17.8 (H) 03/20/2020 1022   LYMPHSABS 1.1 03/20/2020 1022   MONOABS 0.2 03/20/2020 1022   EOSABS 0.1 03/20/2020 1022   BASOSABS 0.0 03/20/2020 1022   Lab Results  Component Value Date   LDH 127 03/20/2020   LDH 124 02/14/2020   LDH 128 01/02/2020   Lab Results  Component Value Date   TOTALPROTELP 6.3 03/20/2020   TOTALPROTELP 6.2 03/20/2020   ALBUMINELP 3.8 03/20/2020   A1GS 0.2 03/20/2020   A2GS 0.6 03/20/2020   BETS 1.1 03/20/2020   GAMS 0.7 03/20/2020   MSPIKE Not Observed 03/20/2020   SPEI Comment 03/20/2020    Lab Results  Component Value Date   KPAFRELGTCHN 43.7 (H) 03/20/2020   LAMBDASER 11.7 03/20/2020   KAPLAMBRATIO 3.74 (H) 03/20/2020    DIAGNOSTIC IMAGING:  I have independently reviewed the scans and discussed with the patient. No results found.   ASSESSMENT:  1. Relapsedkappa light chainmultiple myeloma: -Diagnosis on 06/05/2003, presentation with T12 vertebral body fracture, kappa light chain myeloma diagnosed with 2.03 g of light chains/24-hour urine. -06/09/2003 -06/22/2003 radiation 19 Gray total T11-L1, 19 grade T2-T4 for T3 vertebral  body involvement. -06/26/2003 chemotherapy with Doxil, Velcade and dexamethasone. 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 -06/22/2008 relapse of myeloma with kappa light chains and 24-hour urine found, treatment initiated with Revlimid/dexamethasone from 07/31/2008 through 08/10/2013. -Radiation therapy 25 centigrade to right hip for impending right femoral neck fracture from 08/27/2010 through 09/10/2010. -PET scan on 09/12/2019 did not show any myeloma lesions. -SPEP and immunofixation on 09/12/2019 was normal. Kappa light chains have increased to 48. Ratio has gone up to 4.32 and steadily increasing. -Creatinine and calcium were normal. LDH was normal. -24-hour urine shows no M spike. Immunofixation was normal. Elevated light chain ratio present. -Bone marrow biopsy on 12/02/2019 shows normocellular marrow with plasma cells ranging from 10-20%. Chromosome analysis was normal.FISH panel was negative for high-risk abnormalities. -Skeletal survey on 01/02/2020 shows possible new or better demonstrated lucencies in the T5 and T6 vertebral bodies. -Labs on 01/02/2020 shows kappa light chains 118.7, ratio of 11.2, uptrending. SPEP is negative. -Revlimid 15 mg 3 weeks on/1 week off with weekly dexamethasone 20 mg started on 01/30/2020.   PLAN:  1. Relapsed multiple myeloma: -This is his off week for Revlimid.  He will start back on 03/28/2020. -He has reported up to 2 loose bowel movements daily since he started Revlimid.  He denies any watery diarrhea. -Reviewed labs from 03/20/2020.  M spike and immunofixation are negative.  Kappa light chain ratio improved to 3.74 from 6.53.  Kappa light chains decreased to 43.7 from 73. -Continue Revlimid at the same dose.  Continue weekly dexamethasone. -RTC 6 weeks for follow-up with repeat myeloma labs. -We will consider  starting him on   Xgeva every 3 months and will talk to him at next visit.  2. Chronic mid back pain: -Continue methadone 10 mg every 12 hours.  3. Mild thrombocytopenia: -Mild thrombocytopenia since 2017 was stable.  Platelet count today is 117.  4. Thromboprophylaxis: -Continue aspirin 81 mg daily.  Orders placed this encounter:  Orders Placed This Encounter  Procedures  . Kappa/lambda light chains     Sreedhar Katragadda, MD [facility] [phone]   I, Daniel Khashchuk, am acting as a scribe for Dr. Sreedhar Katagadda.  I, Sreedhar Katragadda MD, have reviewed the above documentation for accuracy and completeness, and I agree with the above.     

## 2020-03-26 NOTE — Patient Instructions (Signed)
[place] at [facility] Discharge Instructions  You were seen today by Dr. Delton Coombes. He went over your recent results. Restart the Revlimid on 10/13. Purchase Imodium over the counter and take 2 tablets after a watery bowel movement. Dr. Delton Coombes will see you back in 6 weeks for labs and follow up.   Thank you for choosing [place] at [facility] to provide your oncology and hematology care.  To afford each patient quality time with our provider, please arrive at least 15 minutes before your scheduled appointment time.   If you have a lab appointment with the [facility] please come in thru the Main Entrance and check in at the main information desk  You need to re-schedule your appointment should you arrive 10 or more minutes late.  We strive to give you quality time with our providers, and arriving late affects you and other patients whose appointments are after yours.  Also, if you no show three or more times for appointments you may be dismissed from the clinic at the providers discretion.     Again, thank you for choosing [facility]  Our hope is that these requests will decrease the amount of time that you wait before being seen by our physicians.       _____________________________________________________________  Should you have questions after your visit to [facility], please contact our office at [phone] between the hours of 8:00 a.m. and 4:30 p.m.  Voicemails left after 4:00 p.m. will not be returned until the following business day.  For prescription refill requests, have your pharmacy contact our office and allow 72 hours.    Cancer Center Support Programs:   > Cancer Support Group  2nd Tuesday of the month 1pm-2pm, Journey Room

## 2020-04-18 ENCOUNTER — Other Ambulatory Visit (HOSPITAL_COMMUNITY): Payer: Self-pay

## 2020-04-18 DIAGNOSIS — C9002 Multiple myeloma in relapse: Secondary | ICD-10-CM

## 2020-04-18 MED ORDER — LENALIDOMIDE 15 MG PO CAPS: ORAL_CAPSULE | ORAL | 0 refills | Status: DC

## 2020-04-18 NOTE — Telephone Encounter (Signed)
Chart reviewed. Revlimid refilled per Dr. Delton Coombes.

## 2020-04-23 ENCOUNTER — Other Ambulatory Visit (HOSPITAL_COMMUNITY): Payer: Self-pay

## 2020-04-23 DIAGNOSIS — G8929 Other chronic pain: Secondary | ICD-10-CM

## 2020-04-23 DIAGNOSIS — C9001 Multiple myeloma in remission: Secondary | ICD-10-CM

## 2020-04-23 DIAGNOSIS — M546 Pain in thoracic spine: Secondary | ICD-10-CM

## 2020-04-23 MED ORDER — METHADONE HCL 10 MG PO TABS
10.0000 mg | ORAL_TABLET | Freq: Two times a day (BID) | ORAL | 0 refills | Status: DC
Start: 2020-04-23 — End: 2020-05-22

## 2020-05-07 ENCOUNTER — Other Ambulatory Visit: Payer: Self-pay

## 2020-05-07 ENCOUNTER — Inpatient Hospital Stay (HOSPITAL_COMMUNITY): Payer: Medicare Other | Attending: Hematology

## 2020-05-07 DIAGNOSIS — G8929 Other chronic pain: Secondary | ICD-10-CM | POA: Insufficient documentation

## 2020-05-07 DIAGNOSIS — Z23 Encounter for immunization: Secondary | ICD-10-CM | POA: Diagnosis not present

## 2020-05-07 DIAGNOSIS — C9002 Multiple myeloma in relapse: Secondary | ICD-10-CM | POA: Diagnosis not present

## 2020-05-07 DIAGNOSIS — D696 Thrombocytopenia, unspecified: Secondary | ICD-10-CM | POA: Insufficient documentation

## 2020-05-07 DIAGNOSIS — Z7982 Long term (current) use of aspirin: Secondary | ICD-10-CM | POA: Insufficient documentation

## 2020-05-07 DIAGNOSIS — C9001 Multiple myeloma in remission: Secondary | ICD-10-CM

## 2020-05-07 LAB — COMPREHENSIVE METABOLIC PANEL
ALT: 15 U/L (ref 0–44)
AST: 19 U/L (ref 15–41)
Albumin: 3.8 g/dL (ref 3.5–5.0)
Alkaline Phosphatase: 43 U/L (ref 38–126)
Anion gap: 8 (ref 5–15)
BUN: 11 mg/dL (ref 6–20)
CO2: 26 mmol/L (ref 22–32)
Calcium: 8.8 mg/dL — ABNORMAL LOW (ref 8.9–10.3)
Chloride: 107 mmol/L (ref 98–111)
Creatinine, Ser: 0.92 mg/dL (ref 0.61–1.24)
GFR, Estimated: >60 mL/min (ref >60)
Glucose, Bld: 100 mg/dL — ABNORMAL HIGH (ref 70–99)
Potassium: 3.7 mmol/L (ref 3.5–5.1)
Sodium: 141 mmol/L (ref 135–145)
Total Bilirubin: 0.9 mg/dL (ref 0.3–1.2)
Total Protein: 6.9 g/dL (ref 6.5–8.1)

## 2020-05-07 LAB — CBC WITH DIFFERENTIAL/PLATELET
Abs Immature Granulocytes: 0.01 10*3/uL (ref 0.00–0.07)
Basophils Absolute: 0.1 10*3/uL (ref 0.0–0.1)
Basophils Relative: 1 %
Eosinophils Absolute: 0.2 10*3/uL (ref 0.0–0.5)
Eosinophils Relative: 4 %
HCT: 41.4 % (ref 39.0–52.0)
Hemoglobin: 12.5 g/dL — ABNORMAL LOW (ref 13.0–17.0)
Immature Granulocytes: 0 %
Lymphocytes Relative: 41 %
Lymphs Abs: 2 10*3/uL (ref 0.7–4.0)
MCH: 24.4 pg — ABNORMAL LOW (ref 26.0–34.0)
MCHC: 30.2 g/dL (ref 30.0–36.0)
MCV: 80.9 fL (ref 80.0–100.0)
Monocytes Absolute: 0.6 10*3/uL (ref 0.1–1.0)
Monocytes Relative: 12 %
Neutro Abs: 2 10*3/uL (ref 1.7–7.7)
Neutrophils Relative %: 42 %
Platelets: 122 10*3/uL — ABNORMAL LOW (ref 150–400)
RBC: 5.12 MIL/uL (ref 4.22–5.81)
RDW: 19.4 % — ABNORMAL HIGH (ref 11.5–15.5)
WBC: 4.8 10*3/uL (ref 4.0–10.5)
nRBC: 0 % (ref 0.0–0.2)

## 2020-05-07 LAB — LACTATE DEHYDROGENASE: LDH: 133 U/L (ref 98–192)

## 2020-05-08 LAB — KAPPA/LAMBDA LIGHT CHAINS
Kappa free light chain: 53.9 mg/L — ABNORMAL HIGH (ref 3.3–19.4)
Kappa, lambda light chain ratio: 3.31 — ABNORMAL HIGH (ref 0.26–1.65)
Lambda free light chains: 16.3 mg/L (ref 5.7–26.3)

## 2020-05-09 LAB — PROTEIN ELECTROPHORESIS, SERUM
A/G Ratio: 1.3 (ref 0.7–1.7)
Albumin ELP: 3.5 g/dL (ref 2.9–4.4)
Alpha-1-Globulin: 0.2 g/dL (ref 0.0–0.4)
Alpha-2-Globulin: 0.6 g/dL (ref 0.4–1.0)
Beta Globulin: 1.2 g/dL (ref 0.7–1.3)
Gamma Globulin: 0.8 g/dL (ref 0.4–1.8)
Globulin, Total: 2.8 g/dL (ref 2.2–3.9)
Total Protein ELP: 6.3 g/dL (ref 6.0–8.5)

## 2020-05-13 LAB — IMMUNOFIXATION ELECTROPHORESIS
IgA: 345 mg/dL (ref 90–386)
IgG (Immunoglobin G), Serum: 847 mg/dL (ref 603–1613)
IgM (Immunoglobulin M), Srm: 65 mg/dL (ref 20–172)
Total Protein ELP: 6.6 g/dL (ref 6.0–8.5)

## 2020-05-14 ENCOUNTER — Other Ambulatory Visit: Payer: Self-pay

## 2020-05-14 ENCOUNTER — Inpatient Hospital Stay (HOSPITAL_BASED_OUTPATIENT_CLINIC_OR_DEPARTMENT_OTHER): Payer: Medicare Other | Admitting: Hematology

## 2020-05-14 ENCOUNTER — Encounter (HOSPITAL_COMMUNITY): Payer: Self-pay | Admitting: Hematology

## 2020-05-14 ENCOUNTER — Other Ambulatory Visit (HOSPITAL_COMMUNITY): Payer: Self-pay | Admitting: Hematology

## 2020-05-14 DIAGNOSIS — Z23 Encounter for immunization: Secondary | ICD-10-CM | POA: Diagnosis not present

## 2020-05-14 DIAGNOSIS — C9001 Multiple myeloma in remission: Secondary | ICD-10-CM | POA: Diagnosis not present

## 2020-05-14 DIAGNOSIS — C9002 Multiple myeloma in relapse: Secondary | ICD-10-CM | POA: Diagnosis not present

## 2020-05-14 DIAGNOSIS — G8929 Other chronic pain: Secondary | ICD-10-CM | POA: Diagnosis not present

## 2020-05-14 DIAGNOSIS — D696 Thrombocytopenia, unspecified: Secondary | ICD-10-CM | POA: Diagnosis not present

## 2020-05-14 DIAGNOSIS — Z7982 Long term (current) use of aspirin: Secondary | ICD-10-CM | POA: Diagnosis not present

## 2020-05-14 MED ORDER — AMOXICILLIN-POT CLAVULANATE 875-125 MG PO TABS: 1.0000 | ORAL_TABLET | Freq: Two times a day (BID) | ORAL | 0 refills | Status: DC

## 2020-05-14 MED ORDER — LENALIDOMIDE 15 MG PO CAPS: ORAL_CAPSULE | ORAL | 0 refills | Status: DC

## 2020-05-14 MED ORDER — INFLUENZA VAC SPLIT QUAD 0.5 ML IM SUSY
0.5000 mL | PREFILLED_SYRINGE | Freq: Once | INTRAMUSCULAR | Status: AC
  Administered 2020-05-14: 0.5 mL via INTRAMUSCULAR
  Filled 2020-05-14: qty 0.5

## 2020-05-14 NOTE — Patient Instructions (Signed)
[place] at [facility] Discharge Instructions  You were seen today by Dr. Delton Coombes. He went over your recent results. You may proceed with getting the COVID booster and flu vaccine. You will be prescribed Augmentin to take twice daily for 7 days. Dr. Delton Coombes will see you back in 6 weeks for labs and follow up.   Thank you for choosing [place] at [facility] to provide your oncology and hematology care.  To afford each patient quality time with our provider, please arrive at least 15 minutes before your scheduled appointment time.   If you have a lab appointment with the [facility] please come in thru the Main Entrance and check in at the main information desk  You need to re-schedule your appointment should you arrive 10 or more minutes late.  We strive to give you quality time with our providers, and arriving late affects you and other patients whose appointments are after yours.  Also, if you no show three or more times for appointments you may be dismissed from the clinic at the providers discretion.     Again, thank you for choosing [facility].  Our hope is that these requests will decrease the amount of time that you wait before being seen by our physicians.       _____________________________________________________________  Should you have questions after your visit to [facility], please contact our office at [phone] between the hours of 8:00 a.m. and 4:30 p.m.  Voicemails left after 4:00 p.m. will not be returned until the following business day.  For prescription refill requests, have your pharmacy contact our office and allow 72 hours.    Cancer Center Support Programs:   > Cancer Support Group  2nd Tuesday of the month 1pm-2pm, Journey Room

## 2020-05-14 NOTE — Progress Notes (Signed)
Patient in for an office visit today.  Request to receive flu shot.  Fluarix injection administered per MD orders. Given to left deltoid.  Tolerated well.  Patient discharged ambulatory and in stable condition.

## 2020-05-14 NOTE — Progress Notes (Signed)
[name], [place] [address]   CLINIC:  Medical Oncology/Hematology  PCP:  Monico Blitz, MD [address] [place] [address]  [phone]  REASON FOR VISIT:  Follow-up for multiple myeloma  PRIOR THERAPY:  1. 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 2. Revlimid maintenance until 2015.  CURRENT THERAPY: Revlimid 3 weeks on/1 week off; Decadron weekly  INTERVAL HISTORY:  [name], a 59 y.o. male, returns for routine follow-up for his multiple myeloma. Arvel was last seen on 03/26/2020.  Today he is accompanied by his wife and he reports feeling okay. He is taking Revlimid 3 weeks on, 1 week off and is tolerating it well. He complains of having an itchy throat due to sinus drainage and has to clear his throat in the mornings. He also reports having rhinorrhea with clear mucus, but he denies having sinus infections.  He got his [place] vaccines and is wondering whether to get a booster.   REVIEW OF SYSTEMS:  Review of Systems  Constitutional: Negative for appetite change and fatigue.  HENT:   Positive for trouble swallowing (itchy throat in AM).        Clear rhinorrhea  Respiratory: Positive for cough (dry).   Genitourinary: Positive for difficulty urinating (urgency).     PAST MEDICAL/SURGICAL HISTORY:  Past Medical History:  Diagnosis Date  . Multiple myeloma in remission ([place]) 11/26/2005   No past surgical history on file.  SOCIAL HISTORY:  Social History   Socioeconomic History  . Marital status: Legally Separated    Spouse name: Not on file  . Number of children: Not on file  . Years of education: Not on file  . Highest education level: Not on file  Occupational History  . Not on file  Tobacco Use  . Smoking status: Never Smoker  . Smokeless tobacco: Never Used  Vaping Use  . Vaping Use: Never used  Substance and Sexual Activity  . Alcohol use: No  . Drug use: No  . Sexual activity: Not on  file  Other Topics Concern  . Not on file  Social History Narrative  . Not on file   Social Determinants of Health   Financial Resource Strain: Low Risk   . Difficulty of Paying Living Expenses: Not hard at all  Food Insecurity: No Food Insecurity  . Worried About Charity fundraiser in the Last Year: Never true  . Ran Out of Food in the Last Year: Never true  Transportation Needs: No Transportation Needs  . Lack of Transportation (Medical): No  . Lack of Transportation (Non-Medical): No  Physical Activity: Inactive  . Days of Exercise per Week: 0 days  . Minutes of Exercise per Session: 0 min  Stress: No Stress Concern Present  . Feeling of Stress : Not at all  Social Connections: Moderately Integrated  . Frequency of Communication with Friends and Family: More than three times a week  . Frequency of Social Gatherings with Friends and Family: Twice a week  . Attends Religious Services: 1 to 4 times per year  . Active Member of Clubs or Organizations: No  . Attends Archivist Meetings: Never  . Marital Status: Married  Human resources officer Violence: Not At Risk  . Fear of Current or Ex-Partner: No  . Emotionally Abused: No  . Physically Abused: No  . Sexually Abused: No    FAMILY HISTORY:  Family History  Problem Relation Age of  Onset  . Diabetes Mother   . Cancer Sister     CURRENT MEDICATIONS:  Current Outpatient Medications  Medication Sig Dispense Refill  . ASPIRIN 81 PO Take 81 mg by mouth daily.    . calcium citrate-vitamin D (CITRACAL+D) 315-200 MG-UNIT tablet Take 1 tablet by mouth daily.     . chlorhexidine (PERIDEX) 0.12 % solution USE AS DIRECTED 15 MLS IN THE MOUTH OR THROAT 2 (TWO) TIMES DAILY. 473 mL 2  . dexamethasone (DECADRON) 4 MG tablet TAKE 5 TABLETS (20 MG TOTAL) BY MOUTH ONCE A WEEK. 20 tablet 3  . folic acid (FOLVITE) 1 MG tablet Take 1 mg by mouth.    Marland Kitchen KLOR-CON M20 20 MEQ tablet TAKE 1 TABLET BY MOUTH EVERY DAY 90 tablet 1  .  lenalidomide (REVLIMID) 15 MG capsule TAKE 1 CAPSULE DAILY FOR 21 DAYS ON AND 7 DAYS OFF. 21 capsule 0  . lidocaine-prilocaine (EMLA) cream Apply 1 application topically as needed.     . methadone (DOLOPHINE) 10 MG tablet Take 1 tablet (10 mg total) by mouth every 12 (twelve) hours. 60 tablet 0  . polyethylene glycol (MIRALAX / GLYCOLAX) packet Take 17 g by mouth daily as needed.     . Pramox-PE-Glycerin-Petrolatum (PREPARATION H) 1-0.25-14.4-15 % CREA Place rectally.     Current Facility-Administered Medications  Medication Dose Route Frequency Provider Last Rate Last Admin  . influenza vac split quadrivalent PF (FLUARIX) injection 0.5 mL  0.5 mL Intramuscular Once Derek Jack, MD       Facility-Administered Medications Ordered in Other Visits  Medication Dose Route Frequency Provider Last Rate Last Admin  . sodium chloride flush (NS) 0.9 % injection 10 mL  10 mL Intravenous PRN Baird Cancer, PA-C   10 mL at 06/19/17 1029    ALLERGIES:  No Known Allergies  PHYSICAL EXAM:  Performance status (ECOG): 1 - Symptomatic but completely ambulatory  There were no vitals filed for this visit. Wt Readings from Last 3 Encounters:  03/26/20 172 lb 4.8 oz (78.2 kg)  02/16/20 174 lb 9.7 oz (79.2 kg)  01/09/20 174 lb 14.4 oz (79.3 kg)   Physical Exam Vitals reviewed.  Constitutional:      Appearance: Normal appearance.  Cardiovascular:     Rate and Rhythm: Normal rate and regular rhythm.     Pulses: Normal pulses.     Heart sounds: Normal heart sounds.  Pulmonary:     Effort: Pulmonary effort is normal.     Breath sounds: Normal breath sounds.  Neurological:     General: No focal deficit present.     Mental Status: He is alert and oriented to person, place, and time.  Psychiatric:        Mood and Affect: Mood normal.        Behavior: Behavior normal.     LABORATORY DATA:  I have reviewed the labs as listed.  CBC Latest Ref Rng & Units 05/07/2020 03/20/2020 02/14/2020    WBC 4.0 - 10.5 K/uL 4.8 5.3 4.7  Hemoglobin 13.0 - 17.0 g/dL 12.5(L) 12.4(L) 12.4(L)  Hematocrit 39 - 52 % 41.4 39.4 41.3  Platelets 150 - 400 K/uL 122(L) 117(L) 43(L)   CMP Latest Ref Rng & Units 05/07/2020 03/20/2020 02/14/2020  Glucose 70 - 99 mg/dL 100(H) 112(H) 109(H)  BUN 6 - 20 mg/dL 11 11 10   Creatinine 0.61 - 1.24 mg/dL 0.92 1.08 0.96  Sodium 135 - 145 mmol/L 141 140 138  Potassium 3.5 - 5.1 mmol/L 3.7 4.0  3.8  Chloride 98 - 111 mmol/L 107 106 106  CO2 22 - 32 mmol/L 26 26 23   Calcium 8.9 - 10.3 mg/dL 8.8(L) 8.7(L) 8.6(L)  Total Protein 6.5 - 8.1 g/dL 6.9 6.3(L) 7.2  Total Bilirubin 0.3 - 1.2 mg/dL 0.9 1.5(H) 1.6(H)  Alkaline Phos 38 - 126 U/L 43 40 40  AST 15 - 41 U/L 19 16 17   ALT 0 - 44 U/L 15 15 15       Component Value Date/Time   RBC 5.12 05/07/2020 0958   MCV 80.9 05/07/2020 0958   MCH 24.4 (L) 05/07/2020 0958   MCHC 30.2 05/07/2020 0958   RDW 19.4 (H) 05/07/2020 0958   LYMPHSABS 2.0 05/07/2020 0958   MONOABS 0.6 05/07/2020 0958   EOSABS 0.2 05/07/2020 0958   BASOSABS 0.1 05/07/2020 0958   Lab Results  Component Value Date   LDH 133 05/07/2020   LDH 127 03/20/2020   LDH 124 02/14/2020   Lab Results  Component Value Date   TOTALPROTELP 6.3 05/07/2020   TOTALPROTELP 6.6 05/07/2020   ALBUMINELP 3.5 05/07/2020   A1GS 0.2 05/07/2020   A2GS 0.6 05/07/2020   BETS 1.2 05/07/2020   GAMS 0.8 05/07/2020   MSPIKE Not Observed 05/07/2020   SPEI Comment 05/07/2020    Lab Results  Component Value Date   KPAFRELGTCHN 53.9 (H) 05/07/2020   LAMBDASER 16.3 05/07/2020   KAPLAMBRATIO 3.31 (H) 05/07/2020    DIAGNOSTIC IMAGING:  I have independently reviewed the scans and discussed with the patient. No results found.   ASSESSMENT:  1. Relapsedkappa light chainmultiple myeloma: -Diagnosis on 06/05/2003, presentation with T12 vertebral body fracture, kappa light chain myeloma diagnosed with 2.03 g of light chains/24-hour urine. -06/09/2003 -06/22/2003 radiation  19 Gray total T11-L1, 19 grade T2-T4 for T3 vertebral body involvement. -06/26/2003 chemotherapy with Doxil, Velcade and dexamethasone. 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 -06/22/2008 relapse of myeloma with kappa light chains and 24-hour urine found, treatment initiated with Revlimid/dexamethasone from 07/31/2008 through 08/10/2013. -Radiation therapy 25 centigrade to right hip for impending right femoral neck fracture from 08/27/2010 through 09/10/2010. -PET scan on 09/12/2019 did not show any myeloma lesions. -SPEP and immunofixation on 09/12/2019 was normal. Kappa light chains have increased to 48. Ratio has gone up to 4.32 and steadily increasing. -Creatinine and calcium were normal. LDH was normal. -24-hour urine shows no M spike. Immunofixation was normal. Elevated light chain ratio present. -Bone marrow biopsy on 12/02/2019 shows normocellular marrow with plasma cells ranging from 10-20%. Chromosome analysis was normal.FISH panel was negative for high-risk abnormalities. -Skeletal survey on 01/02/2020 shows possible new or better demonstrated lucencies in the T5 and T6 vertebral bodies. -Labs on 01/02/2020 shows kappa light chains 118.7, ratio of 11.2, uptrending. SPEP is negative. -Revlimid 15 mg 3 weeks on/1 week off with weekly dexamethasone 20 mg started on 01/30/2020.   PLAN:  1. Relapsed multiple myeloma: -He is continuing to tolerate Revlimid 3 weeks on 1 week off with on and off diarrhea which is well controlled. -We reviewed myeloma labs from 05/07/2020.  Free light chain ratio improved to 3.31 even though kappa light chains are slightly high at 53.9.  Immunofixation and SPEP were negative.  CBC and CMP were close to normal. -He will continue Revlimid 3 weeks on 1 week off.  He will also continue weekly dexamethasone. -RTC 6 weeks with  myeloma labs 1  week prior. -He reports some sinus drainage which has not gotten better with over-the-counter medication in the last couple of weeks.  I will give him Augmentin 875 mg twice daily for 7 days. -Because of his immunocompromised state, I have recommended booster Covid shot and flu shot.  2. Chronic mid back pain: -Continue methadone 10 mg every 12 hours.  3. Mild thrombocytopenia: -Mild thrombocytopenia since 2017 was stable.  Platelet count today is 122.  4. Thromboprophylaxis: -Continue aspirin 81 mg daily.  Orders placed this encounter:  No orders of the defined types were placed in this encounter.    Derek Jack, MD [place] [phone]   I, Milinda Antis, am acting as a scribe for Dr. Sanda Linger.  I, Derek Jack MD, have reviewed the above documentation for accuracy and completeness, and I agree with the above.

## 2020-05-14 NOTE — Telephone Encounter (Signed)
Chart reviewed. Patient's Revlimid refilled per Dr. Delton Coombes

## 2020-05-21 ENCOUNTER — Other Ambulatory Visit (HOSPITAL_COMMUNITY): Payer: Self-pay | Admitting: *Deleted

## 2020-05-21 DIAGNOSIS — C9001 Multiple myeloma in remission: Secondary | ICD-10-CM

## 2020-05-22 ENCOUNTER — Other Ambulatory Visit (HOSPITAL_COMMUNITY): Payer: Self-pay

## 2020-05-22 DIAGNOSIS — G8929 Other chronic pain: Secondary | ICD-10-CM

## 2020-05-22 DIAGNOSIS — C9001 Multiple myeloma in remission: Secondary | ICD-10-CM

## 2020-05-22 MED ORDER — METHADONE HCL 10 MG PO TABS: 10.0000 mg | ORAL_TABLET | Freq: Two times a day (BID) | ORAL | 0 refills | Status: DC

## 2020-06-06 ENCOUNTER — Other Ambulatory Visit (HOSPITAL_COMMUNITY): Payer: Self-pay | Admitting: Hematology

## 2020-06-06 DIAGNOSIS — C9002 Multiple myeloma in relapse: Secondary | ICD-10-CM

## 2020-06-06 NOTE — Telephone Encounter (Signed)
Chart reviewed. Patient's Revlimid refilled per Dr. Katragadda. 

## 2020-06-20 ENCOUNTER — Inpatient Hospital Stay (HOSPITAL_COMMUNITY): Payer: Medicare Other | Attending: Hematology

## 2020-06-20 ENCOUNTER — Other Ambulatory Visit: Payer: Self-pay

## 2020-06-20 ENCOUNTER — Inpatient Hospital Stay (HOSPITAL_COMMUNITY): Payer: Medicare Other

## 2020-06-20 VITALS — BP 158/79 | HR 79 | Temp 97.4°F | Resp 18

## 2020-06-20 DIAGNOSIS — R319 Hematuria, unspecified: Secondary | ICD-10-CM

## 2020-06-20 DIAGNOSIS — D696 Thrombocytopenia, unspecified: Secondary | ICD-10-CM | POA: Diagnosis not present

## 2020-06-20 DIAGNOSIS — C9002 Multiple myeloma in relapse: Secondary | ICD-10-CM | POA: Insufficient documentation

## 2020-06-20 DIAGNOSIS — C9001 Multiple myeloma in remission: Secondary | ICD-10-CM

## 2020-06-20 DIAGNOSIS — M549 Dorsalgia, unspecified: Secondary | ICD-10-CM | POA: Diagnosis not present

## 2020-06-20 DIAGNOSIS — G8929 Other chronic pain: Secondary | ICD-10-CM | POA: Diagnosis not present

## 2020-06-20 DIAGNOSIS — Z7982 Long term (current) use of aspirin: Secondary | ICD-10-CM | POA: Insufficient documentation

## 2020-06-20 LAB — COMPREHENSIVE METABOLIC PANEL
ALT: 14 U/L (ref 0–44)
AST: 16 U/L (ref 15–41)
Albumin: 4 g/dL (ref 3.5–5.0)
Alkaline Phosphatase: 42 U/L (ref 38–126)
Anion gap: 9 (ref 5–15)
BUN: 18 mg/dL (ref 6–20)
CO2: 22 mmol/L (ref 22–32)
Calcium: 9.1 mg/dL (ref 8.9–10.3)
Chloride: 108 mmol/L (ref 98–111)
Creatinine, Ser: 0.85 mg/dL (ref 0.61–1.24)
GFR, Estimated: >60 mL/min (ref >60)
Glucose, Bld: 139 mg/dL — ABNORMAL HIGH (ref 70–99)
Potassium: 3.8 mmol/L (ref 3.5–5.1)
Sodium: 139 mmol/L (ref 135–145)
Total Bilirubin: 1.5 mg/dL — ABNORMAL HIGH (ref 0.3–1.2)
Total Protein: 6.8 g/dL (ref 6.5–8.1)

## 2020-06-20 LAB — CBC WITH DIFFERENTIAL/PLATELET
Abs Immature Granulocytes: 0.01 10*3/uL (ref 0.00–0.07)
Basophils Absolute: 0 10*3/uL (ref 0.0–0.1)
Basophils Relative: 0 %
Eosinophils Absolute: 0 10*3/uL (ref 0.0–0.5)
Eosinophils Relative: 0 %
HCT: 42 % (ref 39.0–52.0)
Hemoglobin: 12.7 g/dL — ABNORMAL LOW (ref 13.0–17.0)
Immature Granulocytes: 0 %
Lymphocytes Relative: 23 %
Lymphs Abs: 1.5 10*3/uL (ref 0.7–4.0)
MCH: 24.3 pg — ABNORMAL LOW (ref 26.0–34.0)
MCHC: 30.2 g/dL (ref 30.0–36.0)
MCV: 80.3 fL (ref 80.0–100.0)
Monocytes Absolute: 0.7 10*3/uL (ref 0.1–1.0)
Monocytes Relative: 11 %
Neutro Abs: 4.2 10*3/uL (ref 1.7–7.7)
Neutrophils Relative %: 66 %
Platelets: 149 10*3/uL — ABNORMAL LOW (ref 150–400)
RBC: 5.23 MIL/uL (ref 4.22–5.81)
RDW: 18.6 % — ABNORMAL HIGH (ref 11.5–15.5)
WBC: 6.4 10*3/uL (ref 4.0–10.5)
nRBC: 0 % (ref 0.0–0.2)

## 2020-06-20 LAB — URINALYSIS, ROUTINE W REFLEX MICROSCOPIC
Bilirubin Urine: NEGATIVE
Glucose, UA: 50 mg/dL — AB
Ketones, ur: NEGATIVE mg/dL
Leukocytes,Ua: NEGATIVE
Nitrite: NEGATIVE
Protein, ur: 100 mg/dL — AB
RBC / HPF: >50 RBC/hpf — ABNORMAL HIGH (ref 0–5)
Specific Gravity, Urine: 1.019 (ref 1.005–1.030)
pH: 6 (ref 5.0–8.0)

## 2020-06-20 LAB — LACTATE DEHYDROGENASE: LDH: 132 U/L (ref 98–192)

## 2020-06-20 MED ORDER — HEPARIN SOD (PORK) LOCK FLUSH 100 UNIT/ML IV SOLN
500.0000 [IU] | Freq: Once | INTRAVENOUS | Status: AC
  Administered 2020-06-20: 500 [IU] via INTRAVENOUS

## 2020-06-20 MED ORDER — SODIUM CHLORIDE 0.9% FLUSH
10.0000 mL | Freq: Once | INTRAVENOUS | Status: AC
  Administered 2020-06-20: 10 mL via INTRAVENOUS

## 2020-06-20 NOTE — Progress Notes (Signed)
Patients port flushed without difficulty.  Good blood return noted with no bruising or swelling noted at site.  Band aid applied.  VSS with discharge and left in satisfactory condition with no s/s of distress noted.   

## 2020-06-20 NOTE — Addendum Note (Signed)
Addended by: Peggye Pitt on: 06/20/2020 03:04 PM   Modules accepted: Orders

## 2020-06-21 LAB — PROTEIN ELECTROPHORESIS, SERUM
A/G Ratio: 1.6 (ref 0.7–1.7)
Albumin ELP: 3.9 g/dL (ref 2.9–4.4)
Alpha-1-Globulin: 0.2 g/dL (ref 0.0–0.4)
Alpha-2-Globulin: 0.5 g/dL (ref 0.4–1.0)
Beta Globulin: 1.1 g/dL (ref 0.7–1.3)
Gamma Globulin: 0.6 g/dL (ref 0.4–1.8)
Globulin, Total: 2.5 g/dL (ref 2.2–3.9)
Total Protein ELP: 6.4 g/dL (ref 6.0–8.5)

## 2020-06-21 LAB — KAPPA/LAMBDA LIGHT CHAINS
Kappa free light chain: 24.8 mg/L — ABNORMAL HIGH (ref 3.3–19.4)
Kappa, lambda light chain ratio: 3.26 — ABNORMAL HIGH (ref 0.26–1.65)
Lambda free light chains: 7.6 mg/L (ref 5.7–26.3)

## 2020-06-21 MED ORDER — LANREOTIDE ACETATE 120 MG/0.5ML ~~LOC~~ SOLN
SUBCUTANEOUS | Status: AC
  Filled 2020-06-21: qty 120

## 2020-06-22 LAB — IMMUNOFIXATION ELECTROPHORESIS
IgA: 245 mg/dL (ref 90–386)
IgG (Immunoglobin G), Serum: 744 mg/dL (ref 603–1613)
IgM (Immunoglobulin M), Srm: 55 mg/dL (ref 20–172)
Total Protein ELP: 6.3 g/dL (ref 6.0–8.5)

## 2020-06-25 ENCOUNTER — Other Ambulatory Visit (HOSPITAL_COMMUNITY): Payer: Self-pay

## 2020-06-25 DIAGNOSIS — C9001 Multiple myeloma in remission: Secondary | ICD-10-CM

## 2020-06-25 DIAGNOSIS — G8929 Other chronic pain: Secondary | ICD-10-CM

## 2020-06-25 MED ORDER — METHADONE HCL 10 MG PO TABS: 10.0000 mg | ORAL_TABLET | Freq: Two times a day (BID) | ORAL | 0 refills | Status: DC

## 2020-06-27 ENCOUNTER — Inpatient Hospital Stay (HOSPITAL_BASED_OUTPATIENT_CLINIC_OR_DEPARTMENT_OTHER): Payer: Medicare Other | Admitting: Hematology

## 2020-06-27 ENCOUNTER — Other Ambulatory Visit: Payer: Self-pay

## 2020-06-27 VITALS — BP 151/87 | HR 78 | Temp 97.2°F | Resp 18 | Wt 175.6 lb

## 2020-06-27 DIAGNOSIS — C9001 Multiple myeloma in remission: Secondary | ICD-10-CM | POA: Diagnosis not present

## 2020-06-27 DIAGNOSIS — C9002 Multiple myeloma in relapse: Secondary | ICD-10-CM | POA: Diagnosis not present

## 2020-06-27 DIAGNOSIS — R319 Hematuria, unspecified: Secondary | ICD-10-CM | POA: Diagnosis not present

## 2020-06-27 DIAGNOSIS — D696 Thrombocytopenia, unspecified: Secondary | ICD-10-CM | POA: Diagnosis not present

## 2020-06-27 DIAGNOSIS — Z7982 Long term (current) use of aspirin: Secondary | ICD-10-CM | POA: Diagnosis not present

## 2020-06-27 DIAGNOSIS — M549 Dorsalgia, unspecified: Secondary | ICD-10-CM | POA: Diagnosis not present

## 2020-06-27 DIAGNOSIS — G8929 Other chronic pain: Secondary | ICD-10-CM | POA: Diagnosis not present

## 2020-06-27 NOTE — Patient Instructions (Signed)
[place] at [facility] Discharge Instructions  You were seen today by Dr. Delton Coombes. He went over your recent results. You will be referred to a urologist in [place] for your bladder stone. Dr. Delton Coombes will see you back in 3 months for labs and follow up.   Thank you for choosing [place] at [facility] to provide your oncology and hematology care.  To afford each patient quality time with our provider, please arrive at least 15 minutes before your scheduled appointment time.   If you have a lab appointment with the [facility] please come in thru the Main Entrance and check in at the main information desk  You need to re-schedule your appointment should you arrive 10 or more minutes late.  We strive to give you quality time with our providers, and arriving late affects you and other patients whose appointments are after yours.  Also, if you no show three or more times for appointments you may be dismissed from the clinic at the providers discretion.     Again, thank you for choosing [facility].  Our hope is that these requests will decrease the amount of time that you wait before being seen by our physicians.       _____________________________________________________________  Should you have questions after your visit to [facility], please contact our office at [phone] between the hours of 8:00 a.m. and 4:30 p.m.  Voicemails left after 4:00 p.m. will not be returned until the following business day.  For prescription refill requests, have your pharmacy contact our office and allow 72 hours.    Cancer Center Support Programs:   > Cancer Support Group  2nd Tuesday of the month 1pm-2pm, Journey Room

## 2020-06-27 NOTE — Progress Notes (Signed)
Steven [name], Steven [name]   CLINIC:  Medical Oncology/Hematology  PCP:  Monico Blitz, MD [address] [place] [address]  [phone]  REASON FOR VISIT:  Follow-up for multiple myeloma  PRIOR THERAPY:  1. 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 2. Revlimid maintenance until 2015.  CURRENT THERAPY: Revlimid 3 weeks on, 1 week off; Decadron weekly  INTERVAL HISTORY:  Steven [name], a 60 y.o. male, returns for routine follow-up for his multiple myeloma. Steven [name] was last seen on 05/14/2020.  Today he is accompanied by his wife and he reports feeling okay. 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 He denies having pain in his back or flanks. He denies having N/V/D/C. His energy levels and back pain are stable. He takes methadone every 12 hours and his pain is well controlled.  He is not being followed by a urologist.   REVIEW OF SYSTEMS:  Review of Systems  Constitutional: Negative for appetite change and fatigue.  Gastrointestinal: Negative for abdominal pain, constipation, diarrhea, nausea and vomiting.  Genitourinary: Positive for hematuria (x 1 episode last week).   Musculoskeletal: Positive for back pain (stable).  All other systems reviewed and are negative.   PAST MEDICAL/SURGICAL HISTORY:  Past Medical History:  Diagnosis Date  . Multiple myeloma in remission ([place]) 11/26/2005   No past surgical history on file.  SOCIAL HISTORY:  Social History   Socioeconomic History  . Marital status: Legally Separated    Spouse name: Not on file  . Number of children: Not on file  . Years of education: Not on file  . Highest education level: Not on file  Occupational History  . Not on file  Tobacco Use  . Smoking status: Never Smoker  . Smokeless tobacco: Never Used  Vaping Use  . Vaping Use: Never  used  Substance and Sexual Activity  . Alcohol use: No  . Drug use: No  . Sexual activity: Not on file  Other Topics Concern  . Not on file  Social History Narrative  . Not on file   Social Determinants of Health   Financial Resource Strain: Low Risk   . Difficulty of Paying Living Expenses: Not hard at all  Food Insecurity: No Food Insecurity  . Worried About Charity fundraiser in the Last Year: Never true  . Ran Out of Food in the Last Year: Never true  Transportation Needs: No Transportation Needs  . Lack of Transportation (Medical): No  . Lack of Transportation (Non-Medical): No  Physical Activity: Inactive  . Days of Exercise per Week: 0 days  . Minutes of Exercise per Session: 0 min  Stress: No Stress Concern Present  . Feeling of Stress : Not at all  Social Connections: Moderately Integrated  . Frequency of Communication with Friends and Family: More than three times a week  . Frequency of Social Gatherings with Friends and Family: Twice a week  . Attends Religious Services: 1 to 4 times per year  . Active Member of Clubs or Organizations: No  . Attends Archivist Meetings: Never  . Marital Status: Married  Human resources officer Violence: Not At Risk  . Fear of Current or Ex-Partner: No  . Emotionally Abused: No  . Physically Abused: No  . Sexually Abused: No    FAMILY HISTORY:  Family  History  Problem Relation Age of Onset  . Diabetes Mother   . Cancer Sister     CURRENT MEDICATIONS:  Current Outpatient Medications  Medication Sig Dispense Refill  . ASPIRIN 81 PO Take 81 mg by mouth daily.    . calcium citrate-vitamin D (CITRACAL+D) 315-200 MG-UNIT tablet Take 1 tablet by mouth daily.     . chlorhexidine (PERIDEX) 0.12 % solution USE AS DIRECTED 15 MLS IN THE MOUTH OR THROAT 2 (TWO) TIMES DAILY. 473 mL 2  . dexamethasone (DECADRON) 4 MG tablet TAKE 5 TABLETS (20 MG TOTAL) BY MOUTH ONCE A WEEK. 20 tablet 3  . folic acid (FOLVITE) 1 MG tablet Take 1 mg  by mouth.    Marland Kitchen KLOR-CON M20 20 MEQ tablet TAKE 1 TABLET BY MOUTH EVERY DAY 90 tablet 1  . lenalidomide (REVLIMID) 15 MG capsule TAKE 1 CAPSULE DAILY FOR 21 DAYS ON AND 7 DAYS OFF 21 capsule 0  . lidocaine-prilocaine (EMLA) cream Apply 1 application topically as needed.     . methadone (DOLOPHINE) 10 MG tablet Take 1 tablet (10 mg total) by mouth every 12 (twelve) hours. 60 tablet 0  . polyethylene glycol (MIRALAX / GLYCOLAX) packet Take 17 g by mouth daily as needed.     . Pramox-PE-Glycerin-Petrolatum 1-0.25-14.4-15 % CREA Place rectally.     No current facility-administered medications for this visit.   Facility-Administered Medications Ordered in Other Visits  Medication Dose Route Frequency Provider Last Rate Last Admin  . sodium chloride flush (NS) 0.9 % injection 10 mL  10 mL Intravenous PRN Baird Cancer, PA-C   10 mL at 06/19/17 1029    ALLERGIES:  No Known Allergies  PHYSICAL EXAM:  Performance status (ECOG): 1 - Symptomatic but completely ambulatory  Vitals:   06/27/20 1551  BP: (!) 151/87  Pulse: 78  Resp: 18  Temp: (!) 97.2 F (36.2 C)  SpO2: 100%   Wt Readings from Last 3 Encounters:  06/27/20 175 lb 9.6 oz (79.7 kg)  03/26/20 172 lb 4.8 oz (78.2 kg)  02/16/20 174 lb 9.7 oz (79.2 kg)   Physical Exam Vitals reviewed.  Constitutional:      Appearance: Normal appearance.  Cardiovascular:     Rate and Rhythm: Normal rate and regular rhythm.     Pulses: Normal pulses.     Heart sounds: Normal heart sounds.  Pulmonary:     Effort: Pulmonary effort is normal.     Breath sounds: Normal breath sounds.  Neurological:     General: No focal deficit present.     Mental Status: He is alert and oriented to person, place, and time.  Psychiatric:        Mood and Affect: Mood normal.        Behavior: Behavior normal.     LABORATORY DATA:  I have reviewed the labs as listed.  CBC Latest Ref Rng & Units 06/20/2020 05/07/2020 03/20/2020  WBC 4.0 - 10.5 K/uL 6.4 4.8  5.3  Hemoglobin 13.0 - 17.0 g/dL 12.7(L) 12.5(L) 12.4(L)  Hematocrit 39.0 - 52.0 % 42.0 41.4 39.4  Platelets 150 - 400 K/uL 149(L) 122(L) 117(L)   CMP Latest Ref Rng & Units 06/20/2020 05/07/2020 03/20/2020  Glucose 70 - 99 mg/dL 139(H) 100(H) 112(H)  BUN 6 - 20 mg/dL 18 11 11   Creatinine 0.61 - 1.24 mg/dL 0.85 0.92 1.08  Sodium 135 - 145 mmol/L 139 141 140  Potassium 3.5 - 5.1 mmol/L 3.8 3.7 4.0  Chloride 98 - 111 mmol/L  108 107 106  CO2 22 - 32 mmol/L 22 26 26   Calcium 8.9 - 10.3 mg/dL 9.1 8.8(L) 8.7(L)  Total Protein 6.5 - 8.1 g/dL 6.8 6.9 6.3(L)  Total Bilirubin 0.3 - 1.2 mg/dL 1.5(H) 0.9 1.5(H)  Alkaline Phos 38 - 126 U/L 42 43 40  AST 15 - 41 U/L 16 19 16   ALT 0 - 44 U/L 14 15 15       Component Value Date/Time   RBC 5.23 06/20/2020 1415   MCV 80.3 06/20/2020 1415   MCH 24.3 (L) 06/20/2020 1415   MCHC 30.2 06/20/2020 1415   RDW 18.6 (H) 06/20/2020 1415   LYMPHSABS 1.5 06/20/2020 1415   MONOABS 0.7 06/20/2020 1415   EOSABS 0.0 06/20/2020 1415   BASOSABS 0.0 06/20/2020 1415   Lab Results  Component Value Date   LDH 132 06/20/2020   LDH 133 05/07/2020   LDH 127 03/20/2020   Lab Results  Component Value Date   TOTALPROTELP 6.4 06/20/2020   TOTALPROTELP 6.3 06/20/2020   ALBUMINELP 3.9 06/20/2020   A1GS 0.2 06/20/2020   A2GS 0.5 06/20/2020   BETS 1.1 06/20/2020   GAMS 0.6 06/20/2020   MSPIKE Not Observed 06/20/2020   SPEI Comment 06/20/2020    Lab Results  Component Value Date   KPAFRELGTCHN 24.8 (H) 06/20/2020   LAMBDASER 7.6 06/20/2020   KAPLAMBRATIO 3.26 (H) 06/20/2020    DIAGNOSTIC IMAGING:  I have independently reviewed the scans and discussed with the patient. No results found.   ASSESSMENT:  1. Relapsedkappa light chainmultiple myeloma: -Diagnosis on 06/05/2003, presentation with T12 vertebral body fracture, kappa light chain myeloma diagnosed with 2.03 g of light chains/24-hour urine. -06/09/2003 -06/22/2003 radiation 19 Gray total T11-L1, 19  grade T2-T4 for T3 vertebral body involvement. -06/26/2003 chemotherapy with Doxil, Velcade and dexamethasone. 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 -06/22/2008 relapse of myeloma with kappa light chains and 24-hour urine found, treatment initiated with Revlimid/dexamethasone from 07/31/2008 through 08/10/2013. -Radiation therapy 25 centigrade to right hip for impending right femoral neck fracture from 08/27/2010 through 09/10/2010. -PET scan on 09/12/2019 did not show any myeloma lesions. -SPEP and immunofixation on 09/12/2019 was normal. Kappa light chains have increased to 48. Ratio has gone up to 4.32 and steadily increasing. -Creatinine and calcium were normal. LDH was normal. -24-hour urine shows no M spike. Immunofixation was normal. Elevated light chain ratio present. -Bone marrow biopsy on 12/02/2019 shows normocellular marrow with plasma cells ranging from 10-20%. Chromosome analysis was normal.FISH panel was negative for high-risk abnormalities. -Skeletal survey on 01/02/2020 shows possible new or better demonstrated lucencies in the T5 and T6 vertebral bodies. -Labs on 01/02/2020 shows kappa light chains 118.7, ratio of 11.2, uptrending. SPEP is negative. -Revlimid 15 mg 3 weeks on/1 week off with weekly dexamethasone 20 mg started on 01/30/2020.   PLAN:  1. Relapsed multiple myeloma: -He is tolerating Revlimid 3 weeks on 1 week of very well. - Denies any new onset pains. - Reviewed myeloma labs from 06/20/2020.  SPEP and immunofixation is negative.  Kappa light chains improved to 24 and ratio improved to 3.26. - Creatinine and calcium are within normal limits. - Continue Revlimid 15 mg 3 weeks on 1 week off with weekly dexamethasone 20 mg. - RTC 3 months with repeat labs. - He reports intermittent hematuria.  Previous PET CT scan from March  last year showed bladder stone.  Urinalysis on 06/20/2020 shows negative leukocytes and nitrates but RBC more than 50 per hpf. - Recommend urology follow-up.  2. Chronic mid back pain: -Continue methadone 10 mg every 12 hours.  3. Mild thrombocytopenia: -Mild thrombocytopenia since 2017 is stable.  Platelet count today is 149.  4. Thromboprophylaxis: -Continue aspirin 81 mg daily.  Orders placed this encounter:  Orders Placed This Encounter  Procedures  . Protein electrophoresis, serum  . Kappa/lambda light chains  . Immunofixation electrophoresis  . CBC with Differential/Platelet  . Comprehensive metabolic panel  . Lactate dehydrogenase     Derek Jack, MD [place] [phone]   I, Milinda Antis, am acting as a scribe for Dr. Sanda Linger.  I, Derek Jack MD, have reviewed the above documentation for accuracy and completeness, and I agree with the above.

## 2020-07-09 ENCOUNTER — Other Ambulatory Visit (HOSPITAL_COMMUNITY): Payer: Self-pay | Admitting: Hematology

## 2020-07-09 DIAGNOSIS — C9002 Multiple myeloma in relapse: Secondary | ICD-10-CM

## 2020-07-09 MED ORDER — LENALIDOMIDE 15 MG PO CAPS: ORAL_CAPSULE | ORAL | 0 refills | Status: DC

## 2020-07-09 NOTE — Telephone Encounter (Signed)
Chart reviewed. Revlimid refilled per Dr. Delton Coombes

## 2020-07-10 ENCOUNTER — Other Ambulatory Visit (HOSPITAL_COMMUNITY): Payer: Self-pay | Admitting: Hematology

## 2020-07-10 DIAGNOSIS — C9002 Multiple myeloma in relapse: Secondary | ICD-10-CM

## 2020-07-24 ENCOUNTER — Other Ambulatory Visit (HOSPITAL_COMMUNITY): Payer: Self-pay | Admitting: Surgery

## 2020-07-24 DIAGNOSIS — G8929 Other chronic pain: Secondary | ICD-10-CM

## 2020-07-24 DIAGNOSIS — C9001 Multiple myeloma in remission: Secondary | ICD-10-CM

## 2020-07-24 MED ORDER — METHADONE HCL 10 MG PO TABS: 10.0000 mg | ORAL_TABLET | Freq: Two times a day (BID) | ORAL | 0 refills | Status: DC

## 2020-08-06 ENCOUNTER — Other Ambulatory Visit: Payer: Self-pay

## 2020-08-06 ENCOUNTER — Other Ambulatory Visit (HOSPITAL_COMMUNITY): Payer: Self-pay

## 2020-08-06 ENCOUNTER — Ambulatory Visit (INDEPENDENT_AMBULATORY_CARE_PROVIDER_SITE_OTHER): Payer: Medicare Other | Admitting: Urology

## 2020-08-06 VITALS — BP 155/85 | HR 66 | Temp 98.5°F | Wt 170.0 lb

## 2020-08-06 DIAGNOSIS — R310 Gross hematuria: Secondary | ICD-10-CM

## 2020-08-06 DIAGNOSIS — C9002 Multiple myeloma in relapse: Secondary | ICD-10-CM

## 2020-08-06 DIAGNOSIS — N210 Calculus in bladder: Secondary | ICD-10-CM | POA: Diagnosis not present

## 2020-08-06 LAB — URINALYSIS, ROUTINE W REFLEX MICROSCOPIC
Bilirubin, UA: NEGATIVE
Glucose, UA: NEGATIVE
Ketones, UA: NEGATIVE
Leukocytes,UA: NEGATIVE
Nitrite, UA: NEGATIVE
Specific Gravity, UA: 1.02 (ref 1.005–1.030)
Urobilinogen, Ur: 0.2 mg/dL (ref 0.2–1.0)
pH, UA: 7 (ref 5.0–7.5)

## 2020-08-06 LAB — MICROSCOPIC EXAMINATION
Bacteria, UA: NONE SEEN
Epithelial Cells (non renal): NONE SEEN /hpf (ref 0–10)
Renal Epithel, UA: NONE SEEN /hpf
WBC, UA: NONE SEEN /hpf (ref 0–5)

## 2020-08-06 MED ORDER — LENALIDOMIDE 15 MG PO CAPS: ORAL_CAPSULE | ORAL | 0 refills | Status: DC

## 2020-08-06 NOTE — Patient Instructions (Signed)
Cystoscopy Cystoscopy is a procedure that is used to help diagnose and sometimes treat conditions that affect the lower urinary tract. The lower urinary tract includes the bladder and the urethra. The urethra is the tube that drains urine from the bladder. Cystoscopy is done using a thin, tube-shaped instrument with a light and camera at the end (cystoscope). The cystoscope may be hard or flexible, depending on the goal of the procedure. The cystoscope is inserted through the urethra, into the bladder. Cystoscopy may be recommended if you have:  Urinary tract infections that keep coming back.  Blood in the urine (hematuria).  An inability to control when you urinate (urinary incontinence) or an overactive bladder.  Unusual cells found in a urine sample.  A blockage in the urethra, such as a urinary stone.  Painful urination.  An abnormality in the bladder found during an intravenous pyelogram (IVP) or CT scan. Cystoscopy may also be done to remove a sample of tissue to be examined under a microscope (biopsy). Tell a health care provider about:  Any allergies you have.  All medicines you are taking, including vitamins, herbs, eye drops, creams, and over-the-counter medicines.  Any problems you or family members have had with anesthetic medicines.  Any blood disorders you have.  Any surgeries you have had.  Any medical conditions you have.  Whether you are pregnant or may be pregnant. What are the risks? Generally, this is a safe procedure. However, problems may occur, including:  Infection.  Bleeding.  Allergic reactions to medicines.  Damage to other structures or organs. What happens before the procedure? Medicines Ask your health care provider about:  Changing or stopping your regular medicines. This is especially important if you are taking diabetes medicines or blood thinners.  Taking medicines such as aspirin and ibuprofen. These medicines can thin your blood. Do  not take these medicines unless your health care provider tells you to take them.  Taking over-the-counter medicines, vitamins, herbs, and supplements. Tests You may have an exam or testing, such as:  X-rays of the bladder, urethra, or kidneys.  CT scan of the abdomen or pelvis.  Urine tests to check for signs of infection. General instructions  Follow instructions from your health care provider about eating or drinking restrictions.  Ask your health care provider what steps will be taken to help prevent infection. These steps may include: ? Washing skin with a germ-killing soap. ? Taking antibiotic medicine.  Plan to have a responsible adult take you home from the hospital or clinic. What happens during the procedure?  You will be given one or more of the following: ? A medicine to help you relax (sedative). ? A medicine to numb the area (local anesthetic).  The area around the opening of your urethra will be cleaned.  The cystoscope will be passed through your urethra into your bladder.  Germ-free (sterile) fluid will flow through the cystoscope to fill your bladder. The fluid will stretch your bladder so that your health care provider can clearly examine your bladder walls.  Your doctor will look at the urethra and bladder. Your doctor may take a biopsy or remove stones.  The cystoscope will be removed, and your bladder will be emptied. The procedure may vary among health care providers and hospitals.   What can I expect after the procedure? After the procedure, it is common to have:  Some soreness or pain in your abdomen and urethra.  Urinary symptoms. These include: ? Mild pain or burning  when you urinate. Pain should stop within a few minutes after you urinate. This may last for up to 1 week. ? A small amount of blood in your urine for several days. ? Feeling like you need to urinate but producing only a small amount of urine. Follow these instructions at  home: Medicines  Take over-the-counter and prescription medicines only as told by your health care provider.  If you were prescribed an antibiotic medicine, take it as told by your health care provider. Do not stop taking the antibiotic even if you start to feel better. General instructions  Return to your normal activities as told by your health care provider. Ask your health care provider what activities are safe for you.  If you were given a sedative during the procedure, it can affect you for several hours. Do not drive or operate machinery until your health care provider says that it is safe.  Watch for any blood in your urine. If the amount of blood in your urine increases, call your health care provider.  Follow instructions from your health care provider about eating or drinking restrictions.  If a tissue sample was removed for testing (biopsy) during your procedure, it is up to you to get your test results. Ask your health care provider, or the department that is doing the test, when your results will be ready.  Drink enough fluid to keep your urine pale yellow.  Keep all follow-up visits. This is important. Contact a health care provider if:  You have pain that gets worse or does not get better with medicine, especially pain when you urinate.  You have trouble urinating.  You have more blood in your urine. Get help right away if:  You have blood clots in your urine.  You have abdominal pain.  You have a fever or chills.  You are unable to urinate. Summary  Cystoscopy is a procedure that is used to help diagnose and sometimes treat conditions that affect the lower urinary tract.  Cystoscopy is done using a thin, tube-shaped instrument with a light and camera at the end.  After the procedure, it is common to have some soreness or pain in your abdomen and urethra.  Watch for any blood in your urine. If the amount of blood in your urine increases, call your health  care provider.  If you were prescribed an antibiotic medicine, take it as told by your health care provider. Do not stop taking the antibiotic even if you start to feel better. This information is not intended to replace advice given to you by your health care provider. Make sure you discuss any questions you have with your health care provider. Document Revised: 01/13/2020 Document Reviewed: 01/13/2020 Elsevier Patient Education  [place].

## 2020-08-06 NOTE — Progress Notes (Signed)
Urological Symptom Review  Patient is experiencing the following symptoms: Burning/pain with urination Penile pain (male only)    Review of Systems  Gastrointestinal (upper)  : Negative for upper GI symptoms  Gastrointestinal (lower) : Negative for lower GI symptoms  Constitutional : Negative for symptoms  Skin: Negative for skin symptoms  Eyes: Negative for eye symptoms  Ear/Nose/Throat : Negative for Ear/Nose/Throat symptoms  Hematologic/Lymphatic: Negative for Hematologic/Lymphatic symptoms  Cardiovascular : Negative for cardiovascular symptoms  Respiratory : Negative for respiratory symptoms  Endocrine: Negative for endocrine symptoms  Musculoskeletal: Joint pain  Neurological: Negative for neurological symptoms  Psychologic: Negative for psychiatric symptoms

## 2020-08-06 NOTE — Telephone Encounter (Signed)
Chart reviewed. Revlimid refilled per Dr. Katragadda 

## 2020-08-06 NOTE — Progress Notes (Signed)
[facility]   08/06/2020 1:06 PM   Steven [name] [birth year] [id number]  Referring provider: Monico Blitz, MD [address] [place],  [place] [address]  No chief complaint on file.   HPI:  Steven [name] was referred for gross hematuria.  He developed hematuria and passed a few clots. Did not see a specific stone pass. His UA showed > 50 rbc. His urine cleared. He takes Revlimid.   He underwent a PET scan March 2021 in evaluation of his multiple myeloma which revealed an 8 mm bladder stone and a 25 g prostate. Jan 2022 Cr 0.85.   No h/o kidney stones but looking back he recalls some left flank pain prior to the Mar 2021 CT. I did note some mild left hydro on the PET maybe from a passed stone.   He was a truckdriver.    PMH: Past Medical History:  Diagnosis Date  . Multiple myeloma in remission ([place]) 11/26/2005    Surgical History: No past surgical history on file.  Home Medications:  Allergies as of 08/06/2020   No Known Allergies     Medication List       Accurate as of August 06, 2020  1:06 PM. If you have any questions, ask your nurse or doctor.        ASPIRIN 81 PO Take 81 mg by mouth daily.   calcium citrate-vitamin D 315-200 MG-UNIT tablet Commonly known as: CITRACAL+D Take 1 tablet by mouth daily.   chlorhexidine 0.12 % solution Commonly known as: PERIDEX USE AS DIRECTED 15 MLS IN THE MOUTH OR THROAT 2 (TWO) TIMES DAILY.   dexamethasone 4 MG tablet Commonly known as: DECADRON TAKE 5 TABLETS (20 MG TOTAL) BY MOUTH ONCE A WEEK.   folic acid 1 MG tablet Commonly known as: FOLVITE Take 1 mg by mouth.   Klor-Con M20 20 MEQ tablet Generic drug: potassium chloride SA TAKE 1 TABLET BY MOUTH EVERY DAY   lenalidomide 15 MG capsule Commonly known as: Revlimid TAKE 1 CAPSULE DAILY FOR 21 DAYS ON AND 7 DAYS OFF   lidocaine-prilocaine cream Commonly known as: EMLA Apply 1 application topically as needed.   methadone 10 MG tablet Commonly known as:  DOLOPHINE Take 1 tablet (10 mg total) by mouth every 12 (twelve) hours.   polyethylene glycol 17 g packet Commonly known as: MIRALAX / GLYCOLAX Take 17 g by mouth daily as needed.   Pramox-PE-Glycerin-Petrolatum 1-0.25-14.4-15 % Crea Place rectally.       Allergies: No Known Allergies  Family History: Family History  Problem Relation Age of Onset  . Diabetes Mother   . Cancer Sister     Social History:  reports that he has never smoked. He has never used smokeless tobacco. He reports that he does not drink alcohol and does not use drugs.   Physical Exam: There were no vitals taken for this visit.  Constitutional:  Alert and oriented, No acute distress. HEENT:  AT, moist mucus membranes.  Trachea midline, no masses. Cardiovascular: No clubbing, cyanosis, or edema. Respiratory: Normal respiratory effort, no increased work of breathing. GI: Abdomen is soft, nontender, nondistended, no abdominal masses GU: No CVA tenderness Lymph: No cervical or inguinal lymphadenopathy. Skin: No rashes, bruises or suspicious lesions. Neurologic: Grossly intact, no focal deficits, moving all 4 extremities. Psychiatric: Normal mood and affect. DRE: 25 grams and normal - no hard area or nodules   Laboratory Data: Lab Results  Component Value Date   WBC 6.4 06/20/2020   HGB 12.7 (  L) 06/20/2020   HCT 42.0 06/20/2020   MCV 80.3 06/20/2020   PLT 149 (L) 06/20/2020    Lab Results  Component Value Date   CREATININE 0.85 06/20/2020    No results found for: PSA  No results found for: TESTOSTERONE  No results found for: HGBA1C  Urinalysis    Component Value Date/Time   COLORURINE AMBER (A) 06/20/2020 1505   APPEARANCEUR CLOUDY (A) 06/20/2020 1505   LABSPEC 1.019 06/20/2020 1505   PHURINE 6.0 06/20/2020 1505   GLUCOSEU 50 (A) 06/20/2020 1505   HGBUR LARGE (A) 06/20/2020 1505   BILIRUBINUR NEGATIVE 06/20/2020 1505   KETONESUR NEGATIVE 06/20/2020 1505   PROTEINUR 100 (A)  06/20/2020 1505   NITRITE NEGATIVE 06/20/2020 1505   LEUKOCYTESUR NEGATIVE 06/20/2020 1505    Lab Results  Component Value Date   BACTERIA RARE (A) 06/20/2020    Pertinent Imaging: PET scan  No results found for this or any previous visit.  No results found for this or any previous visit.  No results found for this or any previous visit.  No results found for this or any previous visit.  No results found for this or any previous visit.  No results found for this or any previous visit.  No results found for this or any previous visit.  No results found for this or any previous visit.   Assessment & Plan:    1. Bladder stone Will check KUB. It's possible this was a passed left renal stone found in the bladder on the 03/21 PET.  - Urinalysis, Routine w reflex microscopic  2. Gross hematuria - check renal US and he will follow-up for cystoscopy.   No follow-ups on file.  Steven Aloe, MD

## 2020-08-09 ENCOUNTER — Ambulatory Visit (HOSPITAL_COMMUNITY)
Admission: RE | Admit: 2020-08-09 | Discharge: 2020-08-09 | Disposition: A | Discharge status: Home / Self Care | Payer: Medicare Other | Source: Ambulatory Visit | Attending: Urology | Admitting: Urology

## 2020-08-09 ENCOUNTER — Other Ambulatory Visit: Payer: Self-pay

## 2020-08-09 DIAGNOSIS — N210 Calculus in bladder: Secondary | ICD-10-CM | POA: Insufficient documentation

## 2020-08-09 DIAGNOSIS — I878 Other specified disorders of veins: Secondary | ICD-10-CM | POA: Diagnosis not present

## 2020-08-09 DIAGNOSIS — M47816 Spondylosis without myelopathy or radiculopathy, lumbar region: Secondary | ICD-10-CM | POA: Diagnosis not present

## 2020-08-23 ENCOUNTER — Other Ambulatory Visit (HOSPITAL_COMMUNITY): Payer: Self-pay

## 2020-08-23 DIAGNOSIS — G8929 Other chronic pain: Secondary | ICD-10-CM

## 2020-08-23 DIAGNOSIS — C9001 Multiple myeloma in remission: Secondary | ICD-10-CM

## 2020-08-23 DIAGNOSIS — M546 Pain in thoracic spine: Secondary | ICD-10-CM

## 2020-08-23 MED ORDER — METHADONE HCL 10 MG PO TABS: 10.0000 mg | ORAL_TABLET | Freq: Two times a day (BID) | ORAL | 0 refills | Status: DC

## 2020-08-31 ENCOUNTER — Other Ambulatory Visit: Payer: Self-pay

## 2020-08-31 ENCOUNTER — Ambulatory Visit (HOSPITAL_COMMUNITY)
Admission: RE | Admit: 2020-08-31 | Discharge: 2020-08-31 | Disposition: A | Discharge status: Home / Self Care | Payer: Medicare Other | Source: Ambulatory Visit | Attending: Urology | Admitting: Urology

## 2020-08-31 DIAGNOSIS — N210 Calculus in bladder: Secondary | ICD-10-CM | POA: Diagnosis not present

## 2020-08-31 DIAGNOSIS — R310 Gross hematuria: Secondary | ICD-10-CM | POA: Diagnosis not present

## 2020-08-31 DIAGNOSIS — R319 Hematuria, unspecified: Secondary | ICD-10-CM | POA: Diagnosis not present

## 2020-09-03 ENCOUNTER — Ambulatory Visit (INDEPENDENT_AMBULATORY_CARE_PROVIDER_SITE_OTHER): Payer: Medicare Other | Admitting: Urology

## 2020-09-03 ENCOUNTER — Other Ambulatory Visit: Payer: Self-pay

## 2020-09-03 VITALS — BP 147/88 | HR 76 | Temp 98.3°F

## 2020-09-03 DIAGNOSIS — R310 Gross hematuria: Secondary | ICD-10-CM | POA: Diagnosis not present

## 2020-09-03 DIAGNOSIS — N201 Calculus of ureter: Secondary | ICD-10-CM

## 2020-09-03 MED ORDER — CIPROFLOXACIN HCL 500 MG PO TABS
500.0000 mg | ORAL_TABLET | Freq: Once | ORAL | Status: AC
  Administered 2020-09-03: 500 mg via ORAL

## 2020-09-03 NOTE — Progress Notes (Signed)
Urological Symptom Review  Patient is experiencing the following symptoms: Get up at night to urinate Blood in urine   Review of Systems  Gastrointestinal (upper)  : Negative for upper GI symptoms  Gastrointestinal (lower) : Negative for lower GI symptoms  Constitutional : Negative for symptoms  Skin: Negative for skin symptoms  Eyes: Blurred vision  Ear/Nose/Throat : Negative for Ear/Nose/Throat symptoms  Hematologic/Lymphatic: Negative for Hematologic/Lymphatic symptoms  Cardiovascular : Negative for cardiovascular symptoms  Respiratory : Negative for respiratory symptoms  Endocrine: Negative for endocrine symptoms  Musculoskeletal: Negative for musculoskeletal symptoms  Neurological: Negative for neurological symptoms  Psychologic: Negative for psychiatric symptoms

## 2020-09-03 NOTE — Patient Instructions (Signed)
Ureteroscopy Ureteroscopy is a procedure to check for and treat problems inside part of the urinary tract. In this procedure, a thin, flexible tube with a light at the end (ureteroscope) is used to look at the inside of the kidneys and the ureters. The ureters are the tubes that carry urine from the kidneys to the bladder. The ureteroscope is inserted into one or both of the ureters. You may need this procedure if you have frequent urinary tract infections (UTIs), blood in your urine, or a stone in one of your ureters. A ureteroscopy can be done:  To find the cause of urine blockage in a ureter and to evaluate other abnormalities inside the ureters or kidneys.  To remove stones.  To remove or treat growths of tissue (polyps), abnormal tissue, and some types of tumors.  To remove a tissue sample and check it for disease under a microscope (biopsy). Tell a health care provider about:  Any allergies you have.  All medicines you are taking, including vitamins, herbs, eye drops, creams, and over-the-counter medicines.  Any problems you or family members have had with anesthetic medicines.  Any blood disorders you have.  Any surgeries you have had.  Any medical conditions you have.  Whether you are pregnant or may be pregnant. What are the risks? Generally, this is a safe procedure. However, problems may occur, including:  Bleeding.  Infection.  Allergic reactions to medicines.  Scarring that narrows the ureter (stricture).  Creating a hole in the ureter (perforation). What happens before the procedure? Staying hydrated Follow instructions from your health care provider about hydration, which may include:  Up to 2 hours before the procedure - you may continue to drink clear liquids, such as water, clear fruit juice, black coffee, and plain tea.   Eating and drinking restrictions Follow instructions from your health care provider about eating and drinking, which may include:  8  hours before the procedure - stop eating heavy meals or foods, such as meat, fried foods, or fatty foods.  6 hours before the procedure - stop eating light meals or foods, such as toast or cereal.  6 hours before the procedure - stop drinking milk or drinks that contain milk.  2 hours before the procedure - stop drinking clear liquids. Medicines Ask your health care provider about:  Changing or stopping your regular medicines. This is especially important if you are taking diabetes medicines or blood thinners.  Taking medicines such as aspirin and ibuprofen. These medicines can thin your blood. Do not take these medicines unless your health care provider tells you to take them.  Taking over-the-counter medicines, vitamins, herbs, and supplements. General instructions  Do not use any products that contain nicotine or tobacco for at least 4 weeks before the procedure. These products include cigarettes, e-cigarettes, and chewing tobacco. If you need help quitting, ask your health care provider.  You may have a urine sample taken to check for infection.  Plan to have someone take you home from the hospital or clinic.  If you will be going home right after the procedure, plan to have someone with you for 24 hours.  Ask your health care provider what steps will be taken to help prevent infection. These may include: ? Washing skin with a germ-killing soap. ? Receiving antibiotic medicine. What happens during the procedure?  An IV will be inserted into one of your veins.  You will be given one or more of the following: ? A medicine to help  you relax (sedative). ? A medicine to make you fall asleep (general anesthetic). ? A medicine that is injected into your spine to numb the area below and slightly above the injection site (spinal anesthetic).  The part of your body that drains urine from your bladder (urethra) will be cleaned with a germ-killing solution.  The ureteroscope will be  passed through your urethra into your bladder.  A salt-water solution will be sent through the ureteroscope to fill your bladder. This will help the health care provider see the openings of your ureters more clearly.  The ureteroscope will be passed into your ureter. ? If a growth is found, a biopsy may be done. ? If a stone is found, it may be removed through the ureteroscope, or the stone may be broken up using a laser, shock waves, or electrical energy. ? In some cases, if the ureter is too small, a tube may be inserted that keeps the ureter open (ureteral stent). The stent may be left in place for 1 or 2 weeks to keep the ureter open, and then the ureteroscopy procedure will be done.  The scope will be removed, and your bladder will be emptied. The procedure may vary among health care providers and hospitals.   What can I expect after the procedure? After your procedure, it is common to have:  Your blood pressure, heart rate, breathing rate, and blood oxygen level monitored until you leave the hospital or clinic.  A burning sensation when you urinate. You may be asked to urinate.  Blood in your urine.  Mild discomfort in your bladder area or kidney area when urinating.  A need to urinate more often or urgently. Follow these instructions at home: Medicines  Take over-the-counter and prescription medicines only as told by your health care provider.  If you were prescribed an antibiotic medicine, take it as told by your health care provider. Do not stop taking the antibiotic even if you start to feel better. General instructions  If you were given a sedative during the procedure, it can affect you for several hours. Do not drive or operate machinery until your health care provider says that it is safe.  To relieve burning, take a warm bath or hold a warm washcloth over your groin.  Drink enough fluid to keep your urine pale yellow. ? Drink two 8-ounce (237 mL) glasses of water  every hour for the first 2 hours after you get home. ? Continue to drink water often at home.  You can eat what you normally do.  Keep all follow-up visits as told by your health care provider. This is important. ? If you had a ureteral stent placed, ask your health care provider when you need to return to have it removed.   Contact a health care provider if you have:  Chills or a fever.  Burning pain for longer than 24 hours after the procedure.  Blood in your urine for longer than 24 hours after the procedure. Get help right away if you have:  Large amounts of blood in your urine.  Blood clots in your urine.  Severe pain.  Chest pain or trouble breathing.  The feeling of a full bladder and you are unable to urinate. These symptoms may represent a serious problem that is an emergency. Do not wait to see if the symptoms will go away. Get medical help right away. Call your local emergency services (911 in the [place] Summary  Ureteroscopy is a procedure to  check for and treat problems inside part of the urinary tract.  In this procedure, a thin, flexible tube with a light at the end (ureteroscope) is used to look at the inside of the kidneys and the ureters.  You may need this procedure if you have frequent urinary tract infections (UTIs), blood in your urine, or a stone in a ureter. This information is not intended to replace advice given to you by your health care provider. Make sure you discuss any questions you have with your health care provider. Document Revised: 03/09/2019 Document Reviewed: 03/09/2019 Elsevier Patient Education  [place].

## 2020-09-03 NOTE — Progress Notes (Signed)
° °  09/03/20  CC: No chief complaint on file.   HPI:  He returns for cystoscopy and management of a left distal stone.   1) gross hematuria -  He developed hematuria and passed a few clots. Did not see a specific stone pass. His UA showed > 50 rbc. His urine cleared. He takes Revlimid.   He underwent a PET scan March 2021 in evaluation of his multiple myeloma which revealed an 8 mm left distal stone (or possibly a bladder stone) and a 25 g prostate. Jan 2022 Cr 0.85.   No h/o kidney stones but looking back he recalls some left flank pain prior to the Mar 2021 CT. I did note some mild left hydro on the PET c/w a passed stone.   He was a truckdriver.   He returns for cystoscopy and to review imaging. KUB 02/22 with no stone seen although there may be a faint stone at the left UVJ. Renal US 02/22 with a 14 mm distal ureter/bladder opacity. No renal mass or hydro. Cysto today, 03/22, without stone but left UO is erythematous and edematous.   Blood pressure (!) 147/88, pulse 76, temperature 98.3 F (36.8 C). NED. A&Ox3.   No respiratory distress   Abd soft, NT, ND Normal phallus with bilateral descended testicles  Cystoscopy Procedure Note  Patient identification was confirmed, informed consent was obtained, and patient was prepped using Betadine solution.  Lidocaine jelly was administered per urethral meatus.     Pre-Procedure: - Inspection reveals a normal caliber ureteral meatus.  Procedure: The flexible cystoscope was introduced without difficulty - No urethral strictures/lesions are present. - normal prostate with mild BPH and parital obs - high bladder neck - Bilateral ureteral orifices identified - the left looks erythematous and edematous  - Bladder mucosa  reveals no ulcers, tumors, or lesions - No bladder stones - No trabeculation  Retroflexion shows normal bladder neck.    Post-Procedure: - Patient tolerated the procedure well  Assessment/ Plan:  Gross  hematuria - benign eval   Left distal stone - considering his prior CT, renal US and cysto today (no stone in bladder), I believe he likely has a retained left distal stone and we discussed the nature r/b/a to cyso, left RGP, left URS/HLL, stent. I drew them a picture of the anatomy and imaging findings. All questions answered. He elects to proceed.    No follow-ups on file.  Festus Aloe, MD

## 2020-09-04 ENCOUNTER — Other Ambulatory Visit (HOSPITAL_COMMUNITY): Payer: Self-pay | Admitting: Hematology

## 2020-09-04 DIAGNOSIS — C9002 Multiple myeloma in relapse: Secondary | ICD-10-CM

## 2020-09-04 NOTE — Telephone Encounter (Signed)
Chart reviewed. Revlimid refilled per Dr. Delton Coombes

## 2020-09-08 ENCOUNTER — Other Ambulatory Visit (HOSPITAL_COMMUNITY): Payer: Self-pay | Admitting: Hematology

## 2020-09-10 ENCOUNTER — Other Ambulatory Visit: Payer: Self-pay | Admitting: Urology

## 2020-09-18 ENCOUNTER — Encounter (HOSPITAL_COMMUNITY): Payer: Self-pay

## 2020-09-18 ENCOUNTER — Inpatient Hospital Stay (HOSPITAL_COMMUNITY): Payer: Medicare Other

## 2020-09-18 ENCOUNTER — Other Ambulatory Visit: Payer: Self-pay

## 2020-09-18 ENCOUNTER — Other Ambulatory Visit (HOSPITAL_COMMUNITY): Payer: Medicare Other

## 2020-09-18 ENCOUNTER — Inpatient Hospital Stay (HOSPITAL_COMMUNITY): Payer: Medicare Other | Attending: Hematology

## 2020-09-18 VITALS — BP 150/84 | HR 82 | Temp 97.2°F | Resp 18

## 2020-09-18 DIAGNOSIS — C9001 Multiple myeloma in remission: Secondary | ICD-10-CM

## 2020-09-18 DIAGNOSIS — M549 Dorsalgia, unspecified: Secondary | ICD-10-CM | POA: Insufficient documentation

## 2020-09-18 DIAGNOSIS — R319 Hematuria, unspecified: Secondary | ICD-10-CM | POA: Diagnosis not present

## 2020-09-18 DIAGNOSIS — G8929 Other chronic pain: Secondary | ICD-10-CM | POA: Diagnosis not present

## 2020-09-18 DIAGNOSIS — Z95828 Presence of other vascular implants and grafts: Secondary | ICD-10-CM

## 2020-09-18 DIAGNOSIS — Z7982 Long term (current) use of aspirin: Secondary | ICD-10-CM | POA: Insufficient documentation

## 2020-09-18 DIAGNOSIS — D696 Thrombocytopenia, unspecified: Secondary | ICD-10-CM | POA: Insufficient documentation

## 2020-09-18 DIAGNOSIS — C9002 Multiple myeloma in relapse: Secondary | ICD-10-CM | POA: Diagnosis not present

## 2020-09-18 DIAGNOSIS — Z452 Encounter for adjustment and management of vascular access device: Secondary | ICD-10-CM | POA: Insufficient documentation

## 2020-09-18 LAB — CBC WITH DIFFERENTIAL/PLATELET
Abs Immature Granulocytes: 0.01 10*3/uL (ref 0.00–0.07)
Basophils Absolute: 0.1 10*3/uL (ref 0.0–0.1)
Basophils Relative: 2 %
Eosinophils Absolute: 0.1 10*3/uL (ref 0.0–0.5)
Eosinophils Relative: 3 %
HCT: 39.7 % (ref 39.0–52.0)
Hemoglobin: 11.8 g/dL — ABNORMAL LOW (ref 13.0–17.0)
Immature Granulocytes: 0 %
Lymphocytes Relative: 43 %
Lymphs Abs: 1.4 10*3/uL (ref 0.7–4.0)
MCH: 24.4 pg — ABNORMAL LOW (ref 26.0–34.0)
MCHC: 29.7 g/dL — ABNORMAL LOW (ref 30.0–36.0)
MCV: 82 fL (ref 80.0–100.0)
Monocytes Absolute: 0.4 10*3/uL (ref 0.1–1.0)
Monocytes Relative: 11 %
Neutro Abs: 1.3 10*3/uL — ABNORMAL LOW (ref 1.7–7.7)
Neutrophils Relative %: 41 %
Platelets: 129 10*3/uL — ABNORMAL LOW (ref 150–400)
RBC: 4.84 MIL/uL (ref 4.22–5.81)
RDW: 17 % — ABNORMAL HIGH (ref 11.5–15.5)
WBC: 3.2 10*3/uL — ABNORMAL LOW (ref 4.0–10.5)
nRBC: 0 % (ref 0.0–0.2)

## 2020-09-18 LAB — COMPREHENSIVE METABOLIC PANEL
ALT: 13 U/L (ref 0–44)
AST: 19 U/L (ref 15–41)
Albumin: 4 g/dL (ref 3.5–5.0)
Alkaline Phosphatase: 34 U/L — ABNORMAL LOW (ref 38–126)
Anion gap: 10 (ref 5–15)
BUN: 12 mg/dL (ref 6–20)
CO2: 25 mmol/L (ref 22–32)
Calcium: 9.2 mg/dL (ref 8.9–10.3)
Chloride: 107 mmol/L (ref 98–111)
Creatinine, Ser: 0.95 mg/dL (ref 0.61–1.24)
GFR, Estimated: >60 mL/min (ref >60)
Glucose, Bld: 125 mg/dL — ABNORMAL HIGH (ref 70–99)
Potassium: 3.6 mmol/L (ref 3.5–5.1)
Sodium: 142 mmol/L (ref 135–145)
Total Bilirubin: 1.8 mg/dL — ABNORMAL HIGH (ref 0.3–1.2)
Total Protein: 6.5 g/dL (ref 6.5–8.1)

## 2020-09-18 LAB — LACTATE DEHYDROGENASE: LDH: 145 U/L (ref 98–192)

## 2020-09-18 MED ORDER — SODIUM CHLORIDE 0.9% FLUSH
10.0000 mL | Freq: Once | INTRAVENOUS | Status: AC
  Administered 2020-09-18: 10 mL via INTRAVENOUS

## 2020-09-18 NOTE — Progress Notes (Signed)
Patients port accessed without difficulty.  Patient complained of pain with initial flush.  Placement checked with no changes and back of port felt with port needle.  Re-flushed at a slower rate with no complaints of pain.  Blood return noted with three labs collected but unable to collect cbcd and cmet.  Attempted to re-flush with complaints of pain and blood pushed back into needle tubing and into saline flush.  Area around port with noted tenderness per patient and small amount of swelling noted below port.  Reviewed with Dr. Delton Coombes with verbal order IV contrast dye study. Patient presented card for IV dye allergy and chart updated and radiology notified.    Patients port deaccessed with band aid applied.  Patient to be scheduled by radiology for IV dye study.  Patient aware and sent to lab for rest of lab work to be drawn.  Patient discharged in satisfactory condition with no s/s of distress noted.

## 2020-09-18 NOTE — Patient Instructions (Signed)
[place] at [facility]  Discharge Instructions:  Report to lab for rest of lab work to be drawn and radiology will call for IV dye study.   _______________________________________________________________  Thank you for choosing [place] at [facility] to provide your oncology and hematology care.  To afford each patient quality time with our providers, please arrive at least 15 minutes before your scheduled appointment.  You need to re-schedule your appointment if you arrive 10 or more minutes late.  We strive to give you quality time with our providers, and arriving late affects you and other patients whose appointments are after yours.  Also, if you no show three or more times for appointments you may be dismissed from the clinic.  Again, thank you for choosing [place] at [place] hope is that these requests will allow you access to exceptional care and in a timely manner. _______________________________________________________________  If you have questions after your visit, please contact our office at [phone] between the hours of 8:30 a.m. and 5:00 p.m. Voicemails left after 4:30 p.m. will not be returned until the following business day. _______________________________________________________________  For prescription refill requests, have your pharmacy contact our office. _______________________________________________________________  Recommendations made by the consultant and any test results will be sent to your referring physician. _______________________________________________________________

## 2020-09-19 LAB — KAPPA/LAMBDA LIGHT CHAINS
Kappa free light chain: 28 mg/L — ABNORMAL HIGH (ref 3.3–19.4)
Kappa, lambda light chain ratio: 2.92 — ABNORMAL HIGH (ref 0.26–1.65)
Lambda free light chains: 9.6 mg/L (ref 5.7–26.3)

## 2020-09-20 LAB — PROTEIN ELECTROPHORESIS, SERUM
A/G Ratio: 1.5 (ref 0.7–1.7)
Albumin ELP: 3.7 g/dL (ref 2.9–4.4)
Alpha-1-Globulin: 0.2 g/dL (ref 0.0–0.4)
Alpha-2-Globulin: 0.4 g/dL (ref 0.4–1.0)
Beta Globulin: 1.1 g/dL (ref 0.7–1.3)
Gamma Globulin: 0.6 g/dL (ref 0.4–1.8)
Globulin, Total: 2.4 g/dL (ref 2.2–3.9)
Total Protein ELP: 6.1 g/dL (ref 6.0–8.5)

## 2020-09-20 LAB — IMMUNOFIXATION ELECTROPHORESIS
IgA: 216 mg/dL (ref 90–386)
IgG (Immunoglobin G), Serum: 673 mg/dL (ref 603–1613)
IgM (Immunoglobulin M), Srm: 29 mg/dL (ref 20–172)
Total Protein ELP: 5.9 g/dL — ABNORMAL LOW (ref 6.0–8.5)

## 2020-09-24 ENCOUNTER — Other Ambulatory Visit (HOSPITAL_COMMUNITY): Payer: Medicare Other

## 2020-09-25 ENCOUNTER — Ambulatory Visit (HOSPITAL_COMMUNITY)
Admission: RE | Admit: 2020-09-25 | Discharge: 2020-09-25 | Disposition: A | Discharge status: Home / Self Care | Payer: Medicare Other | Source: Ambulatory Visit | Attending: Hematology | Admitting: Hematology

## 2020-09-25 ENCOUNTER — Encounter (HOSPITAL_COMMUNITY): Payer: Self-pay

## 2020-09-25 ENCOUNTER — Other Ambulatory Visit: Payer: Self-pay

## 2020-09-25 ENCOUNTER — Inpatient Hospital Stay (HOSPITAL_BASED_OUTPATIENT_CLINIC_OR_DEPARTMENT_OTHER): Payer: Medicare Other | Admitting: Hematology

## 2020-09-25 VITALS — BP 157/75 | HR 65 | Temp 99.0°F | Resp 17 | Wt 164.4 lb

## 2020-09-25 DIAGNOSIS — Z95828 Presence of other vascular implants and grafts: Secondary | ICD-10-CM

## 2020-09-25 DIAGNOSIS — C9001 Multiple myeloma in remission: Secondary | ICD-10-CM | POA: Diagnosis not present

## 2020-09-25 DIAGNOSIS — Z7982 Long term (current) use of aspirin: Secondary | ICD-10-CM | POA: Diagnosis not present

## 2020-09-25 DIAGNOSIS — R319 Hematuria, unspecified: Secondary | ICD-10-CM | POA: Diagnosis not present

## 2020-09-25 DIAGNOSIS — M549 Dorsalgia, unspecified: Secondary | ICD-10-CM | POA: Diagnosis not present

## 2020-09-25 DIAGNOSIS — C9002 Multiple myeloma in relapse: Secondary | ICD-10-CM | POA: Diagnosis not present

## 2020-09-25 DIAGNOSIS — G8929 Other chronic pain: Secondary | ICD-10-CM | POA: Diagnosis not present

## 2020-09-25 DIAGNOSIS — D696 Thrombocytopenia, unspecified: Secondary | ICD-10-CM | POA: Diagnosis not present

## 2020-09-25 NOTE — Patient Instructions (Signed)
[place] at [facility] Discharge Instructions  You were seen today by Dr. Delton Coombes. He went over your recent results. Continue taking Revlimid 3 weeks on and 1 week off and continue taking Decadron once a week. Dr. Delton Coombes will see you back in 3 months for labs and follow up.   Thank you for choosing [place] at [facility] to provide your oncology and hematology care.  To afford each patient quality time with our provider, please arrive at least 15 minutes before your scheduled appointment time.   If you have a lab appointment with the [place] please come in thru the Main Entrance and check in at the main information desk  You need to re-schedule your appointment should you arrive 10 or more minutes late.  We strive to give you quality time with our providers, and arriving late affects you and other patients whose appointments are after yours.  Also, if you no show three or more times for appointments you may be dismissed from the clinic at the providers discretion.     Again, thank you for choosing [facility].  Our hope is that these requests will decrease the amount of time that you wait before being seen by our physicians.       _____________________________________________________________  Should you have questions after your visit to [facility], please contact our office at [phone] between the hours of 8:00 a.m. and 4:30 p.m.  Voicemails left after 4:00 p.m. will not be returned until the following business day.  For prescription refill requests, have your pharmacy contact our office and allow 72 hours.    Cancer Center Support Programs:   > Cancer Support Group  2nd Tuesday of the month 1pm-2pm, Journey Room

## 2020-09-25 NOTE — Progress Notes (Signed)
Steven [name], [name] [id number]   CLINIC:  Medical Oncology/Hematology  PCP:  Monico Blitz, MD [address] [place] [address]  [phone]  REASON FOR VISIT:  Follow-up for multiple myeloma  PRIOR THERAPY:  1. 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 2. Revlimid maintenance until 2015.  CURRENT THERAPY: Revlimid 3/4 weeks; Decadron weekly  INTERVAL HISTORY:  [name], a 60 y.o. male, returns for routine follow-up for his multiple myeloma. [name] was last seen on 06/27/2020.  Today he is accompanied by his wife and he reports feeling okay. He is taking Revlimid 3/4 weeks and is tolerating it well; he denies having N/V/D or abdominal pain. He also continues taking Decadron 20 mg once weekly. He denies having any new bone pains. He continues having constant numbness, tingling and pain in his feet, even at night, and will get aching in his back if he walks without his brace for a while. He took gabapentin briefly which did not help and he was adamant about continuing due to the potential side effect suicidal ideation. He denies having any recent infections.   REVIEW OF SYSTEMS:  Review of Systems  Constitutional: Positive for fatigue (75%). Negative for appetite change.  Gastrointestinal: Negative for abdominal pain, diarrhea, nausea and vomiting.  Neurological: Positive for numbness (& tingling & pain in feet).  All other systems reviewed and are negative.   PAST MEDICAL/SURGICAL HISTORY:  Past Medical History:  Diagnosis Date  . Multiple myeloma in remission ([place]) 11/26/2005   No past surgical history on file.  SOCIAL HISTORY:  Social History   Socioeconomic History  . Marital status: Legally Separated    Spouse name: Not on file  . Number of children: Not on file  . Years of education: Not on file  . Highest education level: Not on file  Occupational History  . Not on file  Tobacco Use  . Smoking status: Never  Smoker  . Smokeless tobacco: Never Used  Vaping Use  . Vaping Use: Never used  Substance and Sexual Activity  . Alcohol use: No  . Drug use: No  . Sexual activity: Not on file  Other Topics Concern  . Not on file  Social History Narrative  . Not on file   Social Determinants of Health   Financial Resource Strain: Low Risk   . Difficulty of Paying Living Expenses: Not hard at all  Food Insecurity: No Food Insecurity  . Worried About Charity fundraiser in the Last Year: Never true  . Ran Out of Food in the Last Year: Never true  Transportation Needs: No Transportation Needs  . Lack of Transportation (Medical): No  . Lack of Transportation (Non-Medical): No  Physical Activity: Inactive  . Days of Exercise per Week: 0 days  . Minutes of Exercise per Session: 0 min  Stress: No Stress Concern Present  . Feeling of Stress : Not at all  Social Connections: Moderately Integrated  . Frequency of Communication with Friends and Family: More than three times a week  . Frequency of Social Gatherings with Friends and Family: Twice a week  . Attends Religious Services: 1 to 4 times per year  . Active Member of Clubs or Organizations: No  . Attends Archivist Meetings: Never  . Marital Status: Married  Human resources officer Violence: Not At Risk  . Fear of Current or Ex-Partner: No  . Emotionally Abused: No  . Physically Abused:  No  . Sexually Abused: No    FAMILY HISTORY:  Family History  Problem Relation Age of Onset  . Diabetes Mother   . Cancer Sister     CURRENT MEDICATIONS:  Current Outpatient Medications  Medication Sig Dispense Refill  . ASPIRIN 81 PO Take 81 mg by mouth daily.    Marland Kitchen CALCIUM PO Take 400 mg by mouth daily.    . chlorhexidine (PERIDEX) 0.12 % solution USE AS DIRECTED 15 MLS IN THE MOUTH OR THROAT 2 (TWO) TIMES DAILY. (Patient taking differently: Use as directed 15 mLs in the mouth or throat daily.) 473 mL 2  . dexamethasone (DECADRON) 4 MG tablet TAKE  5 TABLETS (20 MG TOTAL) BY MOUTH ONCE A WEEK. (Patient taking differently: Take 20 mg by mouth once a week. Tuesday) 20 tablet 3  . folic acid (FOLVITE) 1 MG tablet Take 1 mg by mouth daily.    Marland Kitchen KLOR-CON M20 20 MEQ tablet TAKE 1 TABLET BY MOUTH EVERY DAY (Patient taking differently: Take 20 mEq by mouth daily.) 90 tablet 1  . lenalidomide (REVLIMID) 15 MG capsule TAKE 1 CAPSULE DAILY FOR 21 DAYS ON AND 7 DAYS OFF (Patient taking differently: Take 15 mg by mouth See admin instructions. Take for 21 days on and 7 day off) 21 capsule 0  . lidocaine-prilocaine (EMLA) cream Apply 1 application topically as needed [facility]).    . methadone (DOLOPHINE) 10 MG tablet Take 1 tablet (10 mg total) by mouth every 12 (twelve) hours. 60 tablet 0  . polyethylene glycol (MIRALAX / GLYCOLAX) packet Take 17 g by mouth daily as needed for mild constipation or moderate constipation.    . Pramox-PE-Glycerin-Petrolatum 1-0.25-14.4-15 % CREA Place 1 application rectally daily as needed (Irritation).     No current facility-administered medications for this visit.   Facility-Administered Medications Ordered in Other Visits  Medication Dose Route Frequency Provider Last Rate Last Admin  . sodium chloride flush (NS) 0.9 % injection 10 mL  10 mL Intravenous PRN Baird Cancer, PA-C   10 mL at 06/19/17 1029    ALLERGIES:  Allergies  Allergen Reactions  . Contrast Media [Iodinated Diagnostic Agents]     Burning sensation all over body    PHYSICAL EXAM:  Performance status (ECOG): 1 - Symptomatic but completely ambulatory  Vitals:   09/25/20 1614  BP: (!) 157/75  Pulse: 65  Resp: 17  Temp: 99 F (37.2 C)  SpO2: 99%   Wt Readings from Last 3 Encounters:  09/25/20 164 lb 7 oz (74.6 kg)  08/06/20 170 lb (77.1 kg)  06/27/20 175 lb 9.6 oz (79.7 kg)   Physical Exam  LABORATORY DATA:  I have reviewed the labs as listed.  CBC Latest Ref Rng & Units 09/18/2020 06/20/2020 05/07/2020  WBC 4.0 - 10.5 K/uL 3.2(L) 6.4  4.8  Hemoglobin 13.0 - 17.0 g/dL 11.8(L) 12.7(L) 12.5(L)  Hematocrit 39.0 - 52.0 % 39.7 42.0 41.4  Platelets 150 - 400 K/uL 129(L) 149(L) 122(L)   CMP Latest Ref Rng & Units 09/18/2020 06/20/2020 05/07/2020  Glucose 70 - 99 mg/dL 125(H) 139(H) 100(H)  BUN 6 - 20 mg/dL 12 18 11   Creatinine 0.61 - 1.24 mg/dL 0.95 0.85 0.92  Sodium 135 - 145 mmol/L 142 139 141  Potassium 3.5 - 5.1 mmol/L 3.6 3.8 3.7  Chloride 98 - 111 mmol/L 107 108 107  CO2 22 - 32 mmol/L 25 22 26   Calcium 8.9 - 10.3 mg/dL 9.2 9.1 8.8(L)  Total Protein 6.5 -  8.1 g/dL 6.5 6.8 6.9  Total Bilirubin 0.3 - 1.2 mg/dL 1.8(H) 1.5(H) 0.9  Alkaline Phos 38 - 126 U/L 34(L) 42 43  AST 15 - 41 U/L 19 16 19   ALT 0 - 44 U/L 13 14 15       Component Value Date/Time   RBC 4.84 09/18/2020 1008   MCV 82.0 09/18/2020 1008   MCH 24.4 (L) 09/18/2020 1008   MCHC 29.7 (L) 09/18/2020 1008   RDW 17.0 (H) 09/18/2020 1008   LYMPHSABS 1.4 09/18/2020 1008   MONOABS 0.4 09/18/2020 1008   EOSABS 0.1 09/18/2020 1008   BASOSABS 0.1 09/18/2020 1008    DIAGNOSTIC IMAGING:  I have independently reviewed the scans and discussed with the patient. US RENAL  Result Date: 09/03/2020 CLINICAL DATA:  Hematuria.  Multiple myeloma. EXAM: RENAL / URINARY TRACT ULTRASOUND COMPLETE COMPARISON:  PET CT 09/12/2019 FINDINGS: Right Kidney: Renal measurements: 10.7 x 4.3 x 5.7 cm = volume: 138 mL. Echogenicity within normal limits. No mass or hydronephrosis visualized. Left Kidney: Renal measurements: 10.8 x 5.8 x 5.3 cm = volume: 173 mL. Echogenicity within normal limits. No mass or hydronephrosis visualized. Bladder: Hyperechoic lesion in the left posterior bladder measuring 12 x 14 mm. This is most consistent with a bladder calculus which was identified on the prior PET-CT but has enlarged in the interval. Other: None. IMPRESSION: Negative for hydronephrosis Bladder calculus with interval enlargement Electronically Signed   By: Franchot Gallo M.D.   On: 09/03/2020 10:56      ASSESSMENT:  1. Relapsedkappa light chainmultiple myeloma: -Diagnosis on 06/05/2003, presentation with T12 vertebral body fracture, kappa light chain myeloma diagnosed with 2.03 g of light chains/24-hour urine. -06/09/2003 -06/22/2003 radiation 19 Gray total T11-L1, 19 grade T2-T4 for T3 vertebral body involvement. -06/26/2003 chemotherapy with Doxil, Velcade and dexamethasone. 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 -06/22/2008 relapse of myeloma with kappa light chains and 24-hour urine found, treatment initiated with Revlimid/dexamethasone from 07/31/2008 through 08/10/2013. -Radiation therapy 25 centigrade to right hip for impending right femoral neck fracture from 08/27/2010 through 09/10/2010. -PET scan on 09/12/2019 did not show any myeloma lesions. -SPEP and immunofixation on 09/12/2019 was normal. Kappa light chains have increased to 48. Ratio has gone up to 4.32 and steadily increasing. -Creatinine and calcium were normal. LDH was normal. -24-hour urine shows no M spike. Immunofixation was normal. Elevated light chain ratio present. -Bone marrow biopsy on 12/02/2019 shows normocellular marrow with plasma cells ranging from 10-20%. Chromosome analysis was normal.FISH panel was negative for high-risk abnormalities. -Skeletal survey on 01/02/2020 shows possible new or better demonstrated lucencies in the T5 and T6 vertebral bodies. -Labs on 01/02/2020 shows kappa light chains 118.7, ratio of 11.2, uptrending. SPEP is negative. -Revlimid 15 mg 3 weeks on/1 week off with weekly dexamethasone 20 mg started on 01/30/2020.   PLAN:  1. Relapsed multiple myeloma: -He is tolerating Revlimid 3 weeks on 1 week off reasonably well. -He is continuing dexamethasone 20 mg weekly. -Reviewed myeloma labs from 09/18/2020.  SPEP and immunofixation was negative.   Free light chain ratio improved to 2.92. -Plan is to continue Revlimid 15 mg 3 weeks on/1 week off along with weekly dexamethasone. -RTC 3 months for follow-up with repeat myeloma labs.  2. Chronic mid back pain: -Continue methadone 10 mg every 12 hours. -He reports constant aching in the legs.  He does not want  to try gabapentin as it may cause drowsiness.  3. Mild thrombocytopenia: -Mild thrombocytopenia since 2017 is stable.  Platelet count today is 129.  4. Thromboprophylaxis: -Continue aspirin 81 mg daily.  5.  Intermittent hematuria: -Continue follow-up with urology.  Orders placed this encounter:  Orders Placed This Encounter  Procedures  . CBC with Differential/Platelet  . Comprehensive metabolic panel  . Protein electrophoresis, serum  . Immunofixation electrophoresis  . Kappa/lambda light chains     Derek Jack, MD [place] [phone]   I, Milinda Antis, am acting as a scribe for Dr. Sanda Linger.  I, Derek Jack MD, have reviewed the above documentation for accuracy and completeness, and I agree with the above.

## 2020-09-26 ENCOUNTER — Other Ambulatory Visit (HOSPITAL_COMMUNITY): Payer: Self-pay

## 2020-09-26 ENCOUNTER — Other Ambulatory Visit (HOSPITAL_COMMUNITY): Payer: Self-pay | Admitting: *Deleted

## 2020-09-26 DIAGNOSIS — C9001 Multiple myeloma in remission: Secondary | ICD-10-CM

## 2020-09-26 DIAGNOSIS — M546 Pain in thoracic spine: Secondary | ICD-10-CM

## 2020-09-26 DIAGNOSIS — G8929 Other chronic pain: Secondary | ICD-10-CM

## 2020-09-26 MED ORDER — METHADONE HCL 10 MG PO TABS: 10.0000 mg | ORAL_TABLET | Freq: Two times a day (BID) | ORAL | 0 refills | Status: DC

## 2020-09-26 MED ORDER — PREDNISONE 50 MG PO TABS: ORAL_TABLET | ORAL | 0 refills | Status: DC

## 2020-09-26 MED ORDER — DIPHENHYDRAMINE HCL 50 MG PO TABS: ORAL_TABLET | ORAL | 0 refills | Status: DC

## 2020-10-02 ENCOUNTER — Other Ambulatory Visit (HOSPITAL_COMMUNITY): Payer: Self-pay

## 2020-10-02 DIAGNOSIS — C9002 Multiple myeloma in relapse: Secondary | ICD-10-CM

## 2020-10-02 MED ORDER — LENALIDOMIDE 15 MG PO CAPS: ORAL_CAPSULE | ORAL | 0 refills | Status: DC

## 2020-10-02 NOTE — Telephone Encounter (Signed)
Chart reviewed. Revlimid refilled per Dr. Delton Coombes

## 2020-10-04 NOTE — Patient Instructions (Addendum)
DUE TO COVID-19 ONLY ONE VISITOR IS ALLOWED TO COME WITH YOU AND STAY IN THE WAITING ROOM ONLY DURING PRE OP AND PROCEDURE DAY OF SURGERY. THE 1 VISITOR  MAY VISIT WITH YOU AFTER SURGERY IN YOUR PRIVATE ROOM DURING VISITING HOURS ONLY!  YOU NEED TO HAVE A COVID 19 TEST ON: 10/05/20 @ 12:00 PM, THIS TEST MUST BE DONE BEFORE SURGERY,  COVID TESTING SITE [place] [address], IT IS ON THE RIGHT GOING OUT [address] APPROXIMATELY  2 MINUTES PAST [employer] ON THE RIGHT. ONCE YOUR COVID TEST IS COMPLETED,  PLEASE BEGIN THE QUARANTINE INSTRUCTIONS AS OUTLINED IN YOUR HANDOUT.                Steven [name]   Your procedure is scheduled on: 10/09/20   Report to [facility] Main  Entrance   Report to admitting at: 6:45 AM     Call this number if you have problems the morning of surgery [phone]    Remember: Do not eat food or drink liquids :After Midnight.   BRUSH YOUR TEETH MORNING OF SURGERY AND RINSE YOUR MOUTH OUT, NO CHEWING GUM CANDY OR MINTS.                               You may not have any metal on your body including hair pins and              piercings  Do not wear jewelry, lotions, powders or perfumes, deodorant             Men may shave face and neck.   Do not bring valuables to the hospital. [place].  Contacts, dentures or bridgework may not be worn into surgery.  Leave suitcase in the car. After surgery it may be brought to your room.     Patients discharged the day of surgery will not be allowed to drive home. IF YOU ARE HAVING SURGERY AND GOING HOME THE SAME DAY, YOU MUST HAVE AN ADULT TO DRIVE YOU HOME AND BE WITH YOU FOR 24 HOURS. YOU MAY GO HOME BY TAXI OR UBER OR ORTHERWISE, BUT AN ADULT MUST ACCOMPANY YOU HOME AND STAY WITH YOU FOR 24 HOURS.  Name and phone number of your driver:  Special Instructions: N/A              Please read over the following fact sheets you  were given: _____________________________________________________________________          [facility] - Preparing for Surgery Before surgery, you can play an important role.  Because skin is not sterile, your skin needs to be as free of germs as possible.  You can reduce the number of germs on your skin by washing with CHG (chlorahexidine gluconate) soap before surgery.  CHG is an antiseptic cleaner which kills germs and bonds with the skin to continue killing germs even after washing. Please DO NOT use if you have an allergy to CHG or antibacterial soaps.  If your skin becomes reddened/irritated stop using the CHG and inform your nurse when you arrive at Short Stay. Do not shave (including legs and underarms) for at least 48 hours prior to the first CHG shower.  You may shave your face/neck. Please follow these instructions carefully:  1.  Shower  with CHG Soap the night before surgery and the  morning of Surgery.  2.  If you choose to wash your hair, wash your hair first as usual with your  normal  shampoo.  3.  After you shampoo, rinse your hair and body thoroughly to remove the  shampoo.                           4.  Use CHG as you would any other liquid soap.  You can apply chg directly  to the skin and wash                       Gently with a scrungie or clean washcloth.  5.  Apply the CHG Soap to your body ONLY FROM THE NECK DOWN.   Do not use on face/ open                           Wound or open sores. Avoid contact with eyes, ears mouth and genitals (private parts).                       Wash face,  Genitals (private parts) with your normal soap.             6.  Wash thoroughly, paying special attention to the area where your surgery  will be performed.  7.  Thoroughly rinse your body with warm water from the neck down.  8.  DO NOT shower/wash with your normal soap after using and rinsing off  the CHG Soap.                9.  Pat yourself dry with a clean towel.            10.  Wear clean  pajamas.            11.  Place clean sheets on your bed the night of your first shower and do not  sleep with pets. Day of Surgery : Do not apply any lotions/deodorants the morning of surgery.  Please wear clean clothes to the hospital/surgery center.  FAILURE TO FOLLOW THESE INSTRUCTIONS MAY RESULT IN THE CANCELLATION OF YOUR SURGERY PATIENT SIGNATURE_________________________________  NURSE SIGNATURE__________________________________  ________________________________________________________________________

## 2020-10-05 ENCOUNTER — Encounter (HOSPITAL_COMMUNITY): Payer: Self-pay

## 2020-10-05 ENCOUNTER — Other Ambulatory Visit (HOSPITAL_COMMUNITY)
Admission: RE | Admit: 2020-10-05 | Discharge: 2020-10-05 | Disposition: A | Discharge status: Home / Self Care | Payer: Medicare Other | Source: Ambulatory Visit | Attending: Urology | Admitting: Urology

## 2020-10-05 ENCOUNTER — Encounter (HOSPITAL_COMMUNITY)
Admission: RE | Admit: 2020-10-05 | Discharge: 2020-10-05 | Disposition: A | Discharge status: Home / Self Care | Payer: Medicare Other | Source: Ambulatory Visit | Attending: Urology | Admitting: Urology

## 2020-10-05 ENCOUNTER — Other Ambulatory Visit: Payer: Self-pay

## 2020-10-05 DIAGNOSIS — Z20822 Contact with and (suspected) exposure to covid-19: Secondary | ICD-10-CM | POA: Diagnosis not present

## 2020-10-05 DIAGNOSIS — Z01812 Encounter for preprocedural laboratory examination: Secondary | ICD-10-CM | POA: Insufficient documentation

## 2020-10-05 LAB — CBC
HCT: 39.8 % (ref 39.0–52.0)
Hemoglobin: 12.1 g/dL — ABNORMAL LOW (ref 13.0–17.0)
MCH: 24.9 pg — ABNORMAL LOW (ref 26.0–34.0)
MCHC: 30.4 g/dL (ref 30.0–36.0)
MCV: 81.9 fL (ref 80.0–100.0)
Platelets: 119 10*3/uL — ABNORMAL LOW (ref 150–400)
RBC: 4.86 MIL/uL (ref 4.22–5.81)
RDW: 17.3 % — ABNORMAL HIGH (ref 11.5–15.5)
WBC: 3.9 10*3/uL — ABNORMAL LOW (ref 4.0–10.5)
nRBC: 0 % (ref 0.0–0.2)

## 2020-10-05 NOTE — Progress Notes (Signed)
COVID Vaccine Completed: Yes Date COVID Vaccine completed: 05/23/20 Booster COVID vaccine manufacturer:  Moderna      PCP - Dr. Monico Blitz Cardiologist -   Chest x-ray -  EKG -  Stress Test -  ECHO -  Cardiac Cath -  Pacemaker/ICD device last checked:  Sleep Study -  CPAP -   Fasting Blood Sugar -  Checks Blood Sugar _____ times a day  Blood Thinner Instructions: Aspirin Instructions: Last Dose:  Anesthesia review:   Patient denies shortness of breath, fever, cough and chest pain at PAT appointment   Patient verbalized understanding of instructions that were given to them at the PAT appointment. Patient was also instructed that they will need to review over the PAT instructions again at home before surgery.

## 2020-10-06 LAB — SARS CORONAVIRUS 2 (TAT 6-24 HRS): SARS Coronavirus 2: NEGATIVE

## 2020-10-08 NOTE — Progress Notes (Addendum)
Spoke with patient by phone, pt aware surgery moved to [facility] no food after midnight arrive 700 am  10-09-2020 clear liquids until 600 am then npo. Cbc 10-05-2020 epic covid test 10-05-2020 negative epic.. Follow all other pre op instructions given at pre op appointmment

## 2020-10-09 ENCOUNTER — Ambulatory Visit (HOSPITAL_BASED_OUTPATIENT_CLINIC_OR_DEPARTMENT_OTHER): Payer: Medicare Other | Admitting: Anesthesiology

## 2020-10-09 ENCOUNTER — Ambulatory Visit (HOSPITAL_BASED_OUTPATIENT_CLINIC_OR_DEPARTMENT_OTHER)
Admission: RE | Admit: 2020-10-09 | Discharge: 2020-10-09 | Disposition: A | Discharge status: Home / Self Care | Payer: Medicare Other | Source: Ambulatory Visit | Attending: Urology | Admitting: Urology

## 2020-10-09 ENCOUNTER — Other Ambulatory Visit: Payer: Self-pay

## 2020-10-09 ENCOUNTER — Encounter (HOSPITAL_BASED_OUTPATIENT_CLINIC_OR_DEPARTMENT_OTHER)
Admission: RE | Disposition: A | Discharge status: Home / Self Care | Payer: Self-pay | Source: Ambulatory Visit | Attending: Urology

## 2020-10-09 ENCOUNTER — Ambulatory Visit (HOSPITAL_COMMUNITY): Payer: Medicare Other

## 2020-10-09 ENCOUNTER — Encounter (HOSPITAL_BASED_OUTPATIENT_CLINIC_OR_DEPARTMENT_OTHER): Payer: Self-pay | Admitting: Urology

## 2020-10-09 DIAGNOSIS — C9001 Multiple myeloma in remission: Secondary | ICD-10-CM | POA: Diagnosis not present

## 2020-10-09 DIAGNOSIS — N201 Calculus of ureter: Secondary | ICD-10-CM | POA: Insufficient documentation

## 2020-10-09 DIAGNOSIS — Z91041 Radiographic dye allergy status: Secondary | ICD-10-CM | POA: Insufficient documentation

## 2020-10-09 DIAGNOSIS — Z8579 Personal history of other malignant neoplasms of lymphoid, hematopoietic and related tissues: Secondary | ICD-10-CM | POA: Diagnosis not present

## 2020-10-09 DIAGNOSIS — Z7982 Long term (current) use of aspirin: Secondary | ICD-10-CM | POA: Insufficient documentation

## 2020-10-09 DIAGNOSIS — Z809 Family history of malignant neoplasm, unspecified: Secondary | ICD-10-CM | POA: Diagnosis not present

## 2020-10-09 DIAGNOSIS — Z79899 Other long term (current) drug therapy: Secondary | ICD-10-CM | POA: Insufficient documentation

## 2020-10-09 DIAGNOSIS — M7501 Adhesive capsulitis of right shoulder: Secondary | ICD-10-CM | POA: Diagnosis not present

## 2020-10-09 HISTORY — PX: CYSTOSCOPY/URETEROSCOPY/HOLMIUM LASER/STENT PLACEMENT: SHX6546

## 2020-10-09 SURGERY — CYSTOSCOPY/URETEROSCOPY/HOLMIUM LASER/STENT PLACEMENT
Anesthesia: General | Site: Ureter | Laterality: Left

## 2020-10-09 MED ORDER — DEXAMETHASONE SODIUM PHOSPHATE 4 MG/ML IJ SOLN
INTRAMUSCULAR | Status: DC | PRN
  Administered 2020-10-09: 10 mg via INTRAVENOUS

## 2020-10-09 MED ORDER — OXYCODONE HCL 5 MG PO TABS: 5.0000 mg | ORAL_TABLET | Freq: Once | ORAL | Status: DC | PRN

## 2020-10-09 MED ORDER — PROPOFOL 10 MG/ML IV BOLUS
INTRAVENOUS | Status: AC
  Filled 2020-10-09: qty 60

## 2020-10-09 MED ORDER — LIDOCAINE HCL (CARDIAC) PF 100 MG/5ML IV SOSY
PREFILLED_SYRINGE | INTRAVENOUS | Status: DC | PRN
  Administered 2020-10-09: 60 mg via INTRAVENOUS

## 2020-10-09 MED ORDER — OXYCODONE HCL 5 MG/5ML PO SOLN: 5.0000 mg | Freq: Once | ORAL | Status: DC | PRN

## 2020-10-09 MED ORDER — PROMETHAZINE HCL 25 MG/ML IJ SOLN: 6.2500 mg | INTRAMUSCULAR | Status: DC | PRN

## 2020-10-09 MED ORDER — GLYCOPYRROLATE 0.2 MG/ML IJ SOLN
INTRAMUSCULAR | Status: DC | PRN
  Administered 2020-10-09: .1 mg via INTRAVENOUS

## 2020-10-09 MED ORDER — HYDROMORPHONE HCL 1 MG/ML IJ SOLN: 0.2500 mg | INTRAMUSCULAR | Status: DC | PRN

## 2020-10-09 MED ORDER — ACETAMINOPHEN 500 MG PO TABS
1000.0000 mg | ORAL_TABLET | Freq: Once | ORAL | Status: AC
  Administered 2020-10-09: 1000 mg via ORAL

## 2020-10-09 MED ORDER — CEFAZOLIN SODIUM-DEXTROSE 2-4 GM/100ML-% IV SOLN
2.0000 g | Freq: Once | INTRAVENOUS | Status: AC
  Administered 2020-10-09: 2 g via INTRAVENOUS

## 2020-10-09 MED ORDER — IOHEXOL 300 MG/ML  SOLN
INTRAMUSCULAR | Status: DC | PRN
  Administered 2020-10-09: 5 mL via URETHRAL

## 2020-10-09 MED ORDER — FENTANYL CITRATE (PF) 100 MCG/2ML IJ SOLN
INTRAMUSCULAR | Status: AC
  Filled 2020-10-09: qty 2

## 2020-10-09 MED ORDER — LACTATED RINGERS IV SOLN: INTRAVENOUS | Status: DC

## 2020-10-09 MED ORDER — ORAL CARE MOUTH RINSE: 15.0000 mL | Freq: Once | OROMUCOSAL | Status: DC

## 2020-10-09 MED ORDER — ARTIFICIAL TEARS OPHTHALMIC OINT
TOPICAL_OINTMENT | OPHTHALMIC | Status: AC
  Filled 2020-10-09: qty 7

## 2020-10-09 MED ORDER — FENTANYL CITRATE (PF) 100 MCG/2ML IJ SOLN
INTRAMUSCULAR | Status: DC | PRN
  Administered 2020-10-09: 25 ug via INTRAVENOUS
  Administered 2020-10-09: 50 ug via INTRAVENOUS

## 2020-10-09 MED ORDER — SODIUM CHLORIDE 0.9 % IR SOLN
Status: DC | PRN
  Administered 2020-10-09: 3000 mL via INTRAVESICAL

## 2020-10-09 MED ORDER — ONDANSETRON HCL 4 MG/2ML IJ SOLN
INTRAMUSCULAR | Status: DC | PRN
  Administered 2020-10-09: 4 mg via INTRAVENOUS

## 2020-10-09 MED ORDER — CHLORHEXIDINE GLUCONATE 0.12 % MT SOLN: 15.0000 mL | Freq: Once | OROMUCOSAL | Status: DC

## 2020-10-09 MED ORDER — MIDAZOLAM HCL 2 MG/2ML IJ SOLN
INTRAMUSCULAR | Status: DC | PRN
  Administered 2020-10-09: 1 mg via INTRAVENOUS

## 2020-10-09 MED ORDER — 0.9 % SODIUM CHLORIDE (POUR BTL) OPTIME
TOPICAL | Status: DC | PRN
  Administered 2020-10-09: 500 mL

## 2020-10-09 MED ORDER — MIDAZOLAM HCL 2 MG/2ML IJ SOLN
INTRAMUSCULAR | Status: AC
  Filled 2020-10-09: qty 2

## 2020-10-09 MED ORDER — CEFAZOLIN SODIUM-DEXTROSE 2-4 GM/100ML-% IV SOLN
INTRAVENOUS | Status: AC
  Filled 2020-10-09: qty 100

## 2020-10-09 MED ORDER — PROPOFOL 10 MG/ML IV BOLUS
INTRAVENOUS | Status: DC | PRN
  Administered 2020-10-09: 150 mg via INTRAVENOUS

## 2020-10-09 MED ORDER — ACETAMINOPHEN 500 MG PO TABS
ORAL_TABLET | ORAL | Status: AC
  Filled 2020-10-09: qty 2

## 2020-10-09 SURGICAL SUPPLY — 23 items
BAG DRAIN URO-CYSTO SKYTR STRL (DRAIN) ×2 IMPLANT
BAG DRN UROCATH (DRAIN) ×1
CATH URET 5FR 28IN CONE TIP (BALLOONS)
CATH URET 5FR 28IN OPEN ENDED (CATHETERS) IMPLANT
CATH URET 5FR 70CM CONE TIP (BALLOONS) IMPLANT
CATH URET DUAL LUMEN 6-10FR 50 (CATHETERS) IMPLANT
CLOTH BEACON ORANGE TIMEOUT ST (SAFETY) ×2 IMPLANT
EXTRACTOR STONE NITINOL NGAGE (UROLOGICAL SUPPLIES) ×1 IMPLANT
FIBER LASER TRAC TIP (UROLOGICAL SUPPLIES) ×1 IMPLANT
GLOVE SURG ENC MOIS LTX SZ7.5 (GLOVE) ×2 IMPLANT
GLOVE SURG ENC MOIS LTX SZ8 (GLOVE) IMPLANT
GOWN STRL REUS W/TWL LRG LVL3 (GOWN DISPOSABLE) ×2 IMPLANT
GUIDEWIRE ANG ZIPWIRE 038X150 (WIRE) IMPLANT
GUIDEWIRE STR DUAL SENSOR (WIRE) ×2 IMPLANT
GUIDEWIRE ZIPWRE .038 STRAIGHT (WIRE) IMPLANT
IV NS IRRIG 3000ML ARTHROMATIC (IV SOLUTION) ×4 IMPLANT
KIT TURNOVER CYSTO (KITS) ×2 IMPLANT
MANIFOLD NEPTUNE II (INSTRUMENTS) ×2 IMPLANT
NS IRRIG 500ML POUR BTL (IV SOLUTION) ×2 IMPLANT
PACK CYSTO (CUSTOM PROCEDURE TRAY) ×2 IMPLANT
STENT URET 6FRX26 CONTOUR (STENTS) ×1 IMPLANT
TUBE CONNECTING 12X1/4 (SUCTIONS) ×2 IMPLANT
TUBING UROLOGY SET (TUBING) ×2 IMPLANT

## 2020-10-09 NOTE — Anesthesia Postprocedure Evaluation (Signed)
Anesthesia Post Note  Patient: TREVONTAE LINDAHL  Procedure(s) Performed: CYSTOSCOPY/LEFT RETROGRADE/URETEROSCOPY/HOLMIUM LASER/LEFT STENT PLACEMENT (Left Ureter)     Patient location during evaluation: PACU Anesthesia Type: General Level of consciousness: awake and alert, oriented and patient cooperative Pain management: pain level controlled Vital Signs Assessment: post-procedure vital signs reviewed and stable Respiratory status: spontaneous breathing, nonlabored ventilation and respiratory function stable Cardiovascular status: blood pressure returned to baseline and stable Postop Assessment: no apparent nausea or vomiting Anesthetic complications: no   No complications documented.  Last Vitals:  Vitals:   10/09/20 0702 10/09/20 1008  BP: (!) 157/77 118/84  Pulse: 62 72  Resp: 15 12  Temp: (!) 36.3 C (!) 36.4 C  SpO2: 100% 100%    Last Pain:  Vitals:   10/09/20 0702  TempSrc: Oral  PainSc: 0-No pain                 Pervis Hocking

## 2020-10-09 NOTE — Anesthesia Preprocedure Evaluation (Addendum)
Anesthesia Evaluation  Patient identified by MRN, date of birth, ID band Patient awake    Reviewed: Allergy & Precautions, NPO status , Patient's Chart, lab work & pertinent test results  Airway Mallampati: II  TM Distance: >3 FB Neck ROM: Full    Dental  (+) Poor Dentition, Chipped, Dental Advisory Given, Missing,    Pulmonary neg pulmonary ROS,    Pulmonary exam normal breath sounds clear to auscultation       Cardiovascular negative cardio ROS Normal cardiovascular exam Rhythm:Regular Rate:Normal     Neuro/Psych negative neurological ROS  negative psych ROS   GI/Hepatic negative GI ROS, Neg liver ROS,   Endo/Other  negative endocrine ROS  Renal/GU Renal disease (left ureteral stone)  negative genitourinary   Musculoskeletal  (+) Arthritis , Osteoarthritis,    Abdominal   Peds negative pediatric ROS (+)  Hematology Hx multiple myeloma- in remission, taking thalidomide, ASA 59m, steroid   Anesthesia Other Findings Still has port in place from MM tx  Reproductive/Obstetrics negative OB ROS                            Anesthesia Physical Anesthesia Plan  ASA: II  Anesthesia Plan: General   Post-op Pain Management:    Induction: Intravenous  PONV Risk Score and Plan: 3 and Ondansetron, Dexamethasone, Midazolam and Treatment may vary due to age or medical condition  Airway Management Planned: LMA  Additional Equipment: None  Intra-op Plan:   Post-operative Plan: Extubation in OR  Informed Consent: I have reviewed the patients History and Physical, chart, labs and discussed the procedure including the risks, benefits and alternatives for the proposed anesthesia with the patient or authorized representative who has indicated his/her understanding and acceptance.     Dental advisory given  Plan Discussed with: CRNA  Anesthesia Plan Comments:        Anesthesia Quick  Evaluation

## 2020-10-09 NOTE — Anesthesia Procedure Notes (Signed)
Procedure Name: LMA Insertion Date/Time: 10/09/2020 9:13 AM Performed by: Georgeanne Nim, CRNA Pre-anesthesia Checklist: Patient identified, Emergency Drugs available, Suction available, Patient being monitored and Timeout performed Patient Re-evaluated:Patient Re-evaluated prior to induction Oxygen Delivery Method: Circle system utilized Preoxygenation: Pre-oxygenation with 100% oxygen Induction Type: IV induction Ventilation: Mask ventilation without difficulty LMA: LMA inserted LMA Size: 4.0 Number of attempts: 1 Placement Confirmation: positive ETCO2,  CO2 detector and breath sounds checked- equal and bilateral Tube secured with: Tape Dental Injury: Teeth and Oropharynx as per pre-operative assessment

## 2020-10-09 NOTE — Transfer of Care (Signed)
Immediate Anesthesia Transfer of Care Note  Patient: Steven [name]  Procedure(s) Performed: CYSTOSCOPY/LEFT RETROGRADE/URETEROSCOPY/HOLMIUM LASER/LEFT STENT PLACEMENT (Left Ureter)  Patient Location: PACU  Anesthesia Type:General  Level of Consciousness: awake, alert  and patient cooperative  Airway & Oxygen Therapy: Patient Spontanous Breathing and Patient connected to nasal cannula oxygen  Post-op Assessment: Report given to RN and Post -op Vital signs reviewed and stable  Post vital signs: Reviewed and stable  Last Vitals:  Vitals Value Taken Time  BP 118/84 10/09/20 1008  Temp    Pulse 72 10/09/20 1009  Resp 15 10/09/20 1009  SpO2 100 % 10/09/20 1009  Vitals shown include unvalidated device data.  Last Pain:  Vitals:   10/09/20 0702  TempSrc: Oral  PainSc: 0-No pain      Patients Stated Pain Goal: 5 (53/66/[id number] 0347)  Complications: No complications documented.

## 2020-10-09 NOTE — Discharge Instructions (Signed)
Ureteral Stent Implantation, Care After This sheet gives you information about how to care for yourself after your procedure. Your health care provider may also give you more specific instructions. If you have problems or questions, contact your health care provider.  Removal of the stent-remove the stent on Friday morning, October 12, 2020, as instructed.  What can I expect after the procedure? After the procedure, it is common to have:  Nausea.  Mild pain when you urinate. You may feel this pain in your lower back or lower abdomen. The pain should stop within a few minutes after you urinate. This may last for up to 1 week.  A small amount of blood in your urine for several days. Follow these instructions at home: Medicines  Take over-the-counter and prescription medicines only as told by your health care provider.  If you were prescribed an antibiotic medicine, take it as told by your health care provider. Do not stop taking the antibiotic even if you start to feel better.  Do not drive for 24 hours if you were given a sedative during your procedure.  Ask your health care provider if the medicine prescribed to you requires you to avoid driving or using heavy machinery. Activity  Rest as told by your health care provider.  Avoid sitting for a long time without moving. Get up to take short walks every 1-2 hours. This is important to improve blood flow and breathing. Ask for help if you feel weak or unsteady.  Return to your normal activities as told by your health care provider. Ask your health care provider what activities are safe for you. General instructions  Watch for any blood in your urine. Call your health care provider if the amount of blood in your urine increases.  If you have a catheter: ? Follow instructions from your health care provider about taking care of your catheter and collection bag. ? Do not take baths, swim, or use a hot tub until your health care provider  approves. Ask your health care provider if you may take showers. You may only be allowed to take sponge baths.  Drink enough fluid to keep your urine pale yellow.  Do not use any products that contain nicotine or tobacco, such as cigarettes, e-cigarettes, and chewing tobacco. These can delay healing after surgery. If you need help quitting, ask your health care provider.  Keep all follow-up visits as told by your health care provider. This is important.   Contact a health care provider if:  You have pain that gets worse or does not get better with medicine, especially pain when you urinate.  You have difficulty urinating.  You feel nauseous or you vomit repeatedly during a period of more than 2 days after the procedure. Get help right away if:  Your urine is dark red or has blood clots in it.  You are leaking urine (have incontinence).  The end of the stent comes out of your urethra.  You cannot urinate.  You have sudden, sharp, or severe pain in your abdomen or lower back.  You have a fever.  You have swelling or pain in your legs.  You have difficulty breathing. Summary  After the procedure, it is common to have mild pain when you urinate that goes away within a few minutes after you urinate. This may last for up to 1 week.  Watch for any blood in your urine. Call your health care provider if the amount of blood in your urine  increases.  Take over-the-counter and prescription medicines only as told by your health care provider.  Drink enough fluid to keep your urine pale yellow. This information is not intended to replace advice given to you by your health care provider. Make sure you discuss any questions you have with your health care provider. Document Revised: 03/09/2018 Document Reviewed: 03/10/2018 Elsevier Patient Education  2021 [place] Instructions  Activity: Get plenty of rest for the remainder of the day. A responsible  individual must stay with you for 24 hours following the procedure.  For the next 24 hours, DO NOT: -Drive a car -Paediatric nurse -Drink alcoholic beverages -Take any medication unless instructed by your physician -Make any legal decisions or sign important papers.  Meals: Start with liquid foods such as gelatin or soup. Progress to regular foods as tolerated. Avoid greasy, spicy, heavy foods. If nausea and/or vomiting occur, drink only clear liquids until the nausea and/or vomiting subsides. Call your physician if vomiting continues.  Special Instructions/Symptoms: Your throat may feel dry or sore from the anesthesia or the breathing tube placed in your throat during surgery. If this causes discomfort, gargle with warm salt water. The discomfort should disappear within 24 hours.  If you had a scopolamine patch placed behind your ear for the management of post- operative nausea and/or vomiting:  1. The medication in the patch is effective for 72 hours, after which it should be removed.  Wrap patch in a tissue and discard in the trash. Wash hands thoroughly with soap and water. 2. You may remove the patch earlier than 72 hours if you experience unpleasant side effects which may include dry mouth, dizziness or visual disturbances. 3. Avoid touching the patch. Wash your hands with soap and water after contact with the patch.

## 2020-10-09 NOTE — Op Note (Signed)
Preoperative diagnosis: Left ureteral stone Postoperative diagnosis: Left ureteral stone  Procedure: Cystoscopy left retrograde pyelogram, laser lithotripsy, stone basket extraction, left ureteral stent placement  Surgeon: Junious Silk  Anesthesia: General  Indication for procedure: Steven [name] is a 60 year old male with a history of left flank pain a year or 2 ago.  CT PET scan a year ago showed a left distal stone or bladder stone.  Follow-up ultrasound showed the stone was still present and visible on KUB.  Office cystoscopy revealed an edematous left ureteral orifice with stone likely contained.  Findings: On cystoscopy the urethra and the prostate were unremarkable.  No stone or foreign body in the bladder.  Edematous left UO with stone noted to be in the left intramural ureter on ureteroscopy.  Left retrograde pyelogram-this outlined a single ureter single collecting system unit without dilation or hydronephrosis, but filling defect in the left distal ureter consistent with the stone.  Description of procedure: After consent was obtained patient on the operating room.  After adequate anesthesia he was placed in lithotomy position and prepped and draped in the usual sterile fashion.  Timeout was performed to confirm the patient and procedure.  Cystoscope was passed per urethra and the bladder inspected.  Left ureteral orifice cannulated with a 5 [place] open-ended catheter and retrograde injection of contrast was performed.  Sensor wire advanced and coiled in the collecting system.  I shot a gentle retrograde given patient's history of contrast allergy.  No venous drainage of the contrast.  Ureteroscope was then passed per urethra into the left ureter where the stone was located.  It was broken into 4 to 5 pieces at a setting of 1 and 15 on the laser.  All the pieces were sequentially dropped in the bladder with an engage basket.  I was then able to pass the semirigid all the way up into the  proximal ureter no other stone or stone fragments were noted.  No ureteral injury.  Wire was backloaded on the cystoscope and a 6 x 26 cm stent advanced.  The wire was removed with a good coil seen in the kidney and a good coil in the bladder.  Bladder was drained and the scope removed.  He was awakened and taken the cover room in stable condition.  I left a string on the stent.  Complications: None  Blood loss: Minimal  Specimens to patient: Stone fragments  Drains: 6 x 26 cm left ureteral stent with string  Disposition: Patient stable to PACU.  Discussed procedure with his wife and went over the procedure, postop course and follow-up.

## 2020-10-09 NOTE — H&P (Signed)
H&P  Chief Complaint: Left ureteral stone  History of Present Illness:   1) gross hematuria/left distal stone - He developed hematuria andpassed a few clots. Did not see a specific stone pass.His UA showed > 50 rbc.His urine cleared. He takes Revlimid.  He underwent a PET scan March 2021in evaluation of his multiple myeloma which revealed an 8 mm left distal stone (or possibly a bladder stone) and a25 g prostate. Jan 2022 Cr 0.85.  No h/o kidney stones but looking back he recalls some left flank pain prior to the Mar 2021 CT. I did note some mild left hydro on the PET c/w a passed/passing stone.   He was a truckdriver.  KUB 02/22 with no stone seen although there may be a faint stone at the left UVJ. Renal US 02/22 with a 14 mm distal ureter/bladder opacity. No renal mass or hydro. Cysto 03/22, without stone in bladder but left UO is erythematous and edematous.   He is brought today for cystoscopy with left retrograde pyelogram and left ureteroscopy laser lithotripsy and stent placement.  He is doing well without fever, dysuria or gross hematuria. No stone passage. Some occasional left flank pain.    Past Medical History:  Diagnosis Date  . Multiple myeloma in remission (HCC) 11/26/2005   Past Surgical History:  Procedure Laterality Date  . BACK SURGERY    . porth cath      Home Medications:  Medications Prior to Admission  Medication Sig Dispense Refill Last Dose  . ASPIRIN 81 PO Take 81 mg by mouth daily.   10/02/2020  . CALCIUM PO Take 400 mg by mouth daily.   Past Week at Unknown time  . dexamethasone (DECADRON) 4 MG tablet TAKE 5 TABLETS (20 MG TOTAL) BY MOUTH ONCE A WEEK. (Patient taking differently: Take 20 mg by mouth once a week. Tuesday) 20 tablet 3 10/02/2020  . folic acid (FOLVITE) 1 MG tablet Take 1 mg by mouth daily.   10/08/2020 at Unknown time  . KLOR-CON M20 20 MEQ tablet TAKE 1 TABLET BY MOUTH EVERY DAY (Patient taking differently: Take 20 mEq by mouth  daily.) 90 tablet 1 10/08/2020 at Unknown time  . methadone (DOLOPHINE) 10 MG tablet Take 1 tablet (10 mg total) by mouth every 12 (twelve) hours. 60 tablet 0 10/08/2020 at Unknown time  . predniSONE (DELTASONE) 50 MG tablet Take 50 mg (1 tablet) by mouth 13 hours, 7 hours, and 1 hour prior to dye study procedure 3 tablet 0 10/08/2020 at Unknown time  . chlorhexidine (PERIDEX) 0.12 % solution USE AS DIRECTED 15 MLS IN THE MOUTH OR THROAT 2 (TWO) TIMES DAILY. (Patient taking differently: Use as directed 15 mLs in the mouth or throat daily.) 473 mL 2 More than a month at Unknown time  . diphenhydrAMINE (BENADRYL) 50 MG tablet Take 50 mg (2 tablets) by mouth 1 hour prior to dye study procedure. 1 tablet 0 More than a month at Unknown time  . lenalidomide (REVLIMID) 15 MG capsule TAKE 1 CAPSULE DAILY FOR 21 DAYS ON AND 7 DAYS OFF 21 capsule 0 10/03/2020  . lidocaine-prilocaine (EMLA) cream Apply 1 application topically as needed (Port).   More than a month at Unknown time  . polyethylene glycol (MIRALAX / GLYCOLAX) packet Take 17 g by mouth daily as needed for mild constipation or moderate constipation.   More than a month at Unknown time  . Pramox-PE-Glycerin-Petrolatum 1-0.25-14.4-15 % CREA Place 1 application rectally daily as needed (Irritation).     More than a month at Unknown time   Allergies:  Allergies  Allergen Reactions  . Contrast Media [Iodinated Diagnostic Agents]     Burning sensation all over body    Family History  Problem Relation Age of Onset  . Diabetes Mother   . Cancer Sister    Social History:  reports that he has never smoked. He has never used smokeless tobacco. He reports that he does not drink alcohol and does not use drugs.  ROS: A complete review of systems was performed.  All systems are negative except for pertinent findings as noted. Review of Systems  All other systems reviewed and are negative.    Physical Exam:  Vital signs in last 24 hours: Temp:  [97.4 F  (36.3 C)] 97.4 F (36.3 C) (04/26 0702) Pulse Rate:  [62] 62 (04/26 0702) Resp:  [15] 15 (04/26 0702) BP: (157)/(77) 157/77 (04/26 0702) SpO2:  [100 %] 100 % (04/26 0702) Weight:  [77.4 kg] 77.4 kg (04/26 0702) General:  Alert and oriented, No acute distress HEENT: Normocephalic, atraumatic Cardiovascular: Regular rate and rhythm Lungs: Regular rate and effort Abdomen: Soft, nontender, nondistended, no abdominal masses Back: No CVA tenderness Extremities: No edema Neurologic: Grossly intact  Laboratory Data:  No results found for this or any previous visit (from the past 24 hour(s)). Recent Results (from the past 240 hour(s))  SARS CORONAVIRUS 2 (TAT 6-24 HRS) Nasopharyngeal Nasopharyngeal Swab     Status: None   Collection Time: 10/05/20 11:03 AM   Specimen: Nasopharyngeal Swab  Result Value Ref Range Status   SARS Coronavirus 2 NEGATIVE NEGATIVE Final    Comment: (NOTE) SARS-CoV-2 target nucleic acids are NOT DETECTED.  The SARS-CoV-2 RNA is generally detectable in upper and lower respiratory specimens during the acute phase of infection. Negative results do not preclude SARS-CoV-2 infection, do not rule out co-infections with other pathogens, and should not be used as the sole basis for treatment or other patient management decisions. Negative results must be combined with clinical observations, patient history, and epidemiological information. The expected result is Negative.  Fact Sheet for Patients: SugarRoll.be  Fact Sheet for Healthcare Providers: https://www.woods-mathews.com/  This test is not yet approved or cleared by the [place] FDA and  has been authorized for detection and/or diagnosis of SARS-CoV-2 by FDA under an Emergency Use Authorization (EUA). This EUA will remain  in effect (meaning this test can be used) for the duration of the COVID-19 declaration under Se ction 564(b)(1) of the Act, 21  U.S.C. section 360bbb-3(b)(1), unless the authorization is terminated or revoked sooner.  Performed at [facility], [place] [address], [place], [place] [address]    Creatinine: No results for input(s): CREATININE in the last 168 hours.  Impression/Assessment/plan: I discussed with the patient the nature, potential benefits, risks and alternatives to cystoscopy, left retrograde, left ureteroscopy, laer lithotripsy and stent, including side effects of the proposed treatment, the likelihood of the patient achieving the goals of the procedure, and any potential problems that might occur during the procedure or recuperation. We discussed it's possible he passed the stone and/or no stone is present. All questions answered. Patient elects to proceed.     Festus Aloe 10/09/2020, 7:27 AM

## 2020-10-11 ENCOUNTER — Encounter (HOSPITAL_BASED_OUTPATIENT_CLINIC_OR_DEPARTMENT_OTHER): Payer: Self-pay | Admitting: Urology

## 2020-10-22 ENCOUNTER — Other Ambulatory Visit (HOSPITAL_COMMUNITY): Payer: Self-pay | Admitting: Surgery

## 2020-10-22 DIAGNOSIS — G8929 Other chronic pain: Secondary | ICD-10-CM

## 2020-10-22 DIAGNOSIS — C9001 Multiple myeloma in remission: Secondary | ICD-10-CM

## 2020-10-22 MED ORDER — METHADONE HCL 10 MG PO TABS: 10.0000 mg | ORAL_TABLET | Freq: Two times a day (BID) | ORAL | 0 refills | Status: DC

## 2020-10-23 ENCOUNTER — Ambulatory Visit (HOSPITAL_COMMUNITY): Payer: Medicare Other

## 2020-10-30 ENCOUNTER — Other Ambulatory Visit (HOSPITAL_COMMUNITY): Payer: Self-pay

## 2020-10-30 DIAGNOSIS — C9002 Multiple myeloma in relapse: Secondary | ICD-10-CM

## 2020-10-30 MED ORDER — LENALIDOMIDE 15 MG PO CAPS: ORAL_CAPSULE | ORAL | 0 refills | Status: DC

## 2020-10-30 NOTE — Telephone Encounter (Signed)
Chart reviewed. Revlimid refilled per Dr. Delton Coombes.

## 2020-11-19 ENCOUNTER — Other Ambulatory Visit: Payer: Self-pay

## 2020-11-19 ENCOUNTER — Ambulatory Visit (INDEPENDENT_AMBULATORY_CARE_PROVIDER_SITE_OTHER): Payer: Medicare Other | Admitting: Urology

## 2020-11-19 ENCOUNTER — Encounter: Payer: Self-pay | Admitting: Urology

## 2020-11-19 VITALS — BP 156/62 | HR 73 | Ht 66.0 in | Wt 164.0 lb

## 2020-11-19 DIAGNOSIS — N201 Calculus of ureter: Secondary | ICD-10-CM

## 2020-11-19 DIAGNOSIS — R310 Gross hematuria: Secondary | ICD-10-CM | POA: Diagnosis not present

## 2020-11-19 LAB — MICROSCOPIC EXAMINATION
Bacteria, UA: NONE SEEN
Epithelial Cells (non renal): NONE SEEN /hpf (ref 0–10)
Renal Epithel, UA: NONE SEEN /hpf
WBC, UA: NONE SEEN /hpf (ref 0–5)

## 2020-11-19 LAB — URINALYSIS, ROUTINE W REFLEX MICROSCOPIC
Bilirubin, UA: NEGATIVE
Glucose, UA: NEGATIVE
Ketones, UA: NEGATIVE
Leukocytes,UA: NEGATIVE
Nitrite, UA: NEGATIVE
Protein,UA: NEGATIVE
Specific Gravity, UA: 1.025 (ref 1.005–1.030)
Urobilinogen, Ur: 0.2 mg/dL (ref 0.2–1.0)
pH, UA: 6 (ref 5.0–7.5)

## 2020-11-19 NOTE — Patient Instructions (Signed)

## 2020-11-19 NOTE — Progress Notes (Signed)
11/19/2020 10:28 AM   Steven [name] December 04, 1960 [id number]  Referring provider: Monico Blitz, MD [address] [place],  [place] [address]  No chief complaint on file.   HPI:  F/u left ureteral stone seen on PET scan in 2021 and confirmed on cysto and [place] 04/22. S/p left URS/laser/stent 04/22. He pulled the stent. No other renal stones on imaging. He is doing great. No gross hematuria or flank pain.   PMH: Past Medical History:  Diagnosis Date  . Multiple myeloma in remission ([place]) 11/26/2005    Surgical History: Past Surgical History:  Procedure Laterality Date  . BACK SURGERY    . CYSTOSCOPY/URETEROSCOPY/HOLMIUM LASER/STENT PLACEMENT Left 10/09/2020   Procedure: CYSTOSCOPY/LEFT RETROGRADE/URETEROSCOPY/HOLMIUM LASER/LEFT STENT PLACEMENT;  Surgeon: Festus Aloe, MD;  [facility];  Service: Urology;  Laterality: Left;  ONLY NEEDS 60 MIN  . porth cath      Home Medications:  Allergies as of 11/19/2020      Reactions   Contrast Media [iodinated Diagnostic Agents]    Burning sensation all over body      Medication List       Accurate as of November 19, 2020 10:28 AM. If you have any questions, ask your nurse or doctor.        ASPIRIN 81 PO Take 81 mg by mouth daily.   CALCIUM PO Take 400 mg by mouth daily.   chlorhexidine 0.12 % solution Commonly known as: PERIDEX USE AS DIRECTED 15 MLS IN THE MOUTH OR THROAT 2 (TWO) TIMES DAILY. What changed: when to take this   dexamethasone 4 MG tablet Commonly known as: DECADRON TAKE 5 TABLETS (20 MG TOTAL) BY MOUTH ONCE A WEEK. What changed: additional instructions   diphenhydrAMINE 50 MG tablet Commonly known as: BENADRYL Take 50 mg (2 tablets) by mouth 1 hour prior to dye study procedure.   folic acid 1 MG tablet Commonly known as: FOLVITE Take 1 mg by mouth daily.   Klor-Con M20 20 MEQ tablet Generic drug: potassium chloride SA TAKE 1 TABLET BY MOUTH EVERY DAY What changed: how much to  take   lenalidomide 15 MG capsule Commonly known as: Revlimid TAKE 1 CAPSULE DAILY FOR 21 DAYS ON AND 7 DAYS OFF   lidocaine-prilocaine cream Commonly known as: EMLA Apply 1 application topically as needed (Port).   methadone 10 MG tablet Commonly known as: DOLOPHINE Take 1 tablet (10 mg total) by mouth every 12 (twelve) hours.   polyethylene glycol 17 g packet Commonly known as: MIRALAX / GLYCOLAX Take 17 g by mouth daily as needed for mild constipation or moderate constipation.   Pramox-PE-Glycerin-Petrolatum 1-0.25-14.4-15 % Crea Place 1 application rectally daily as needed (Irritation).   predniSONE 50 MG tablet Commonly known as: DELTASONE Take 50 mg (1 tablet) by mouth 13 hours, 7 hours, and 1 hour prior to dye study procedure       Allergies:  Allergies  Allergen Reactions  . Contrast Media [Iodinated Diagnostic Agents]     Burning sensation all over body    Family History: Family History  Problem Relation Age of Onset  . Diabetes Mother   . Cancer Sister     Social History:  reports that he has never smoked. He has never used smokeless tobacco. He reports that he does not drink alcohol and does not use drugs.   Physical Exam: BP (!) 156/62   Pulse 73   Ht 5' 6"  (1.676 m)   Wt 164 lb (74.4 kg)  BMI 26.47 kg/m   Constitutional:  Alert and oriented, No acute distress. HEENT: [place] AT, moist mucus membranes.  Trachea midline, no masses. Cardiovascular: No clubbing, cyanosis, or edema. Respiratory: Normal respiratory effort, no increased work of breathing. GI: Abdomen is soft, nontender, nondistended, no abdominal masses GU: No CVA tenderness Skin: No rashes, bruises or suspicious lesions. Neurologic: Grossly intact, no focal deficits, moving all 4 extremities. Psychiatric: Normal mood and affect.  Laboratory Data: Lab Results  Component Value Date   WBC 3.9 (L) 10/05/2020   HGB 12.1 (L) 10/05/2020   HCT 39.8 10/05/2020   MCV 81.9 10/05/2020   PLT  119 (L) 10/05/2020    Lab Results  Component Value Date   CREATININE 0.95 09/18/2020    No results found for: PSA  No results found for: TESTOSTERONE  No results found for: HGBA1C  Urinalysis    Component Value Date/Time   COLORURINE AMBER (A) 06/20/2020 1505   APPEARANCEUR Clear 08/06/2020 1401   LABSPEC 1.019 06/20/2020 1505   PHURINE 6.0 06/20/2020 1505   GLUCOSEU Negative 08/06/2020 1401   HGBUR LARGE (A) 06/20/2020 1505   BILIRUBINUR Negative 08/06/2020 1401   KETONESUR NEGATIVE 06/20/2020 1505   PROTEINUR 1+ (A) 08/06/2020 1401   PROTEINUR 100 (A) 06/20/2020 1505   NITRITE Negative 08/06/2020 1401   NITRITE NEGATIVE 06/20/2020 1505   LEUKOCYTESUR Negative 08/06/2020 1401   LEUKOCYTESUR NEGATIVE 06/20/2020 1505    Lab Results  Component Value Date   LABMICR See below: 08/06/2020   WBCUA None seen 08/06/2020   LABEPIT None seen 08/06/2020   MUCUS Present 08/06/2020   BACTERIA None seen 08/06/2020   Imaging: PET scan 2021, [place] 2022 images reviewed again   Results for orders placed during the hospital encounter of 08/09/20  DG Abd 1 View  Narrative CLINICAL DATA:  Hematuria, bladder stone, multiple myeloma  EXAM: ABDOMEN - 1 VIEW  COMPARISON:  PET-CT dated 09/12/2019. Metastatic bone survey dated 01/02/2020.  FINDINGS: No definite renal or ureteral calculi. No definite bladder calculus.  Calcified pelvic phleboliths.  Degenerative changes of the lumbar spine. Mottled osseous lucencies throughout the pelvis and right proximal femur in this patient with multiple myeloma.  IMPRESSION: No definite renal or ureteral calculi. No definite bladder calculus.  Mottled osseous lucencies in this patient with multiple myeloma.   Electronically Signed By: Julian Hy M.D. On: 08/10/2020 13:39  No results found for this or any previous visit.  No results found for this or any previous visit.  No results found for this or any previous  visit.  Results for orders placed during the hospital encounter of 08/31/20  US RENAL  Narrative CLINICAL DATA:  Hematuria.  Multiple myeloma.  EXAM: RENAL / URINARY TRACT ULTRASOUND COMPLETE  COMPARISON:  PET CT 09/12/2019  FINDINGS: Right Kidney:  Renal measurements: 10.7 x 4.3 x 5.7 cm = volume: 138 mL. Echogenicity within normal limits. No mass or hydronephrosis visualized.  Left Kidney:  Renal measurements: 10.8 x 5.8 x 5.3 cm = volume: 173 mL. Echogenicity within normal limits. No mass or hydronephrosis visualized.  Bladder:  Hyperechoic lesion in the left posterior bladder measuring 12 x 14 mm. This is most consistent with a bladder calculus which was identified on the prior PET-CT but has enlarged in the interval.  Other:  None.  IMPRESSION: Negative for hydronephrosis  Bladder calculus with interval enlargement   Electronically Signed By: Franchot Gallo M.D. On: 09/03/2020 10:56  No results found for this or any previous visit.  No results found for this or any previous visit.  No results found for this or any previous visit.   Assessment & Plan:    1. Ureteral stone - s/p left URS. Check renal US to ensure no further stone or hydro and if negative I can see him back as needed.  - Urinalysis, Routine w reflex microscopic   No follow-ups on file.  Festus Aloe, MD

## 2020-11-19 NOTE — Progress Notes (Signed)

## 2020-11-23 ENCOUNTER — Other Ambulatory Visit (HOSPITAL_COMMUNITY): Payer: Self-pay

## 2020-11-23 DIAGNOSIS — C9002 Multiple myeloma in relapse: Secondary | ICD-10-CM

## 2020-11-23 MED ORDER — LENALIDOMIDE 15 MG PO CAPS: ORAL_CAPSULE | ORAL | 0 refills | Status: DC

## 2020-11-23 NOTE — Telephone Encounter (Signed)
Chart reviewed. Revlimid refilled per Dr. Delton Coombes

## 2020-11-26 ENCOUNTER — Other Ambulatory Visit (HOSPITAL_COMMUNITY): Payer: Self-pay

## 2020-11-26 ENCOUNTER — Other Ambulatory Visit: Payer: Self-pay

## 2020-11-26 ENCOUNTER — Ambulatory Visit (HOSPITAL_COMMUNITY)
Admission: RE | Admit: 2020-11-26 | Discharge: 2020-11-26 | Disposition: A | Discharge status: Home / Self Care | Payer: Medicare Other | Source: Ambulatory Visit | Attending: Urology | Admitting: Urology

## 2020-11-26 DIAGNOSIS — N201 Calculus of ureter: Secondary | ICD-10-CM | POA: Diagnosis not present

## 2020-11-26 DIAGNOSIS — C9001 Multiple myeloma in remission: Secondary | ICD-10-CM

## 2020-11-26 DIAGNOSIS — G8929 Other chronic pain: Secondary | ICD-10-CM

## 2020-11-26 MED ORDER — METHADONE HCL 10 MG PO TABS: 10.0000 mg | ORAL_TABLET | Freq: Two times a day (BID) | ORAL | 0 refills | Status: DC

## 2020-11-27 ENCOUNTER — Other Ambulatory Visit (HOSPITAL_COMMUNITY): Payer: Self-pay

## 2020-11-27 DIAGNOSIS — M546 Pain in thoracic spine: Secondary | ICD-10-CM

## 2020-11-27 DIAGNOSIS — C9001 Multiple myeloma in remission: Secondary | ICD-10-CM

## 2020-11-30 NOTE — Progress Notes (Signed)
Sent via mychart

## 2020-12-19 ENCOUNTER — Other Ambulatory Visit: Payer: Self-pay

## 2020-12-19 ENCOUNTER — Inpatient Hospital Stay (HOSPITAL_COMMUNITY): Payer: Medicare Other | Attending: Hematology

## 2020-12-19 ENCOUNTER — Other Ambulatory Visit (HOSPITAL_COMMUNITY): Payer: Self-pay

## 2020-12-19 DIAGNOSIS — C9002 Multiple myeloma in relapse: Secondary | ICD-10-CM

## 2020-12-19 DIAGNOSIS — C9001 Multiple myeloma in remission: Secondary | ICD-10-CM

## 2020-12-19 DIAGNOSIS — C9000 Multiple myeloma not having achieved remission: Secondary | ICD-10-CM | POA: Diagnosis not present

## 2020-12-19 LAB — CBC WITH DIFFERENTIAL/PLATELET
Abs Immature Granulocytes: 0.01 10*3/uL (ref 0.00–0.07)
Basophils Absolute: 0 10*3/uL (ref 0.0–0.1)
Basophils Relative: 0 %
Eosinophils Absolute: 0 10*3/uL (ref 0.0–0.5)
Eosinophils Relative: 0 %
HCT: 38.1 % — ABNORMAL LOW (ref 39.0–52.0)
Hemoglobin: 11.7 g/dL — ABNORMAL LOW (ref 13.0–17.0)
Immature Granulocytes: 0 %
Lymphocytes Relative: 21 %
Lymphs Abs: 1.1 10*3/uL (ref 0.7–4.0)
MCH: 25.3 pg — ABNORMAL LOW (ref 26.0–34.0)
MCHC: 30.7 g/dL (ref 30.0–36.0)
MCV: 82.5 fL (ref 80.0–100.0)
Monocytes Absolute: 1 10*3/uL (ref 0.1–1.0)
Monocytes Relative: 19 %
Neutro Abs: 3.1 10*3/uL (ref 1.7–7.7)
Neutrophils Relative %: 60 %
Platelets: 103 10*3/uL — ABNORMAL LOW (ref 150–400)
RBC: 4.62 MIL/uL (ref 4.22–5.81)
RDW: 17.6 % — ABNORMAL HIGH (ref 11.5–15.5)
WBC: 5.2 10*3/uL (ref 4.0–10.5)
nRBC: 0 % (ref 0.0–0.2)

## 2020-12-19 LAB — COMPREHENSIVE METABOLIC PANEL
ALT: 12 U/L (ref 0–44)
AST: 16 U/L (ref 15–41)
Albumin: 3.9 g/dL (ref 3.5–5.0)
Alkaline Phosphatase: 34 U/L — ABNORMAL LOW (ref 38–126)
Anion gap: 6 (ref 5–15)
BUN: 17 mg/dL (ref 6–20)
CO2: 26 mmol/L (ref 22–32)
Calcium: 9.3 mg/dL (ref 8.9–10.3)
Chloride: 111 mmol/L (ref 98–111)
Creatinine, Ser: 0.93 mg/dL (ref 0.61–1.24)
GFR, Estimated: >60 mL/min (ref >60)
Glucose, Bld: 95 mg/dL (ref 70–99)
Potassium: 3.9 mmol/L (ref 3.5–5.1)
Sodium: 143 mmol/L (ref 135–145)
Total Bilirubin: 1.7 mg/dL — ABNORMAL HIGH (ref 0.3–1.2)
Total Protein: 6.5 g/dL (ref 6.5–8.1)

## 2020-12-19 MED ORDER — LENALIDOMIDE 15 MG PO CAPS: ORAL_CAPSULE | ORAL | 0 refills | Status: DC

## 2020-12-19 NOTE — Telephone Encounter (Signed)
Chart reviewed. Revlimid refilled per last office note with Dr. Katragadda.  

## 2020-12-20 ENCOUNTER — Other Ambulatory Visit (HOSPITAL_COMMUNITY): Payer: Self-pay | Admitting: Hematology

## 2020-12-20 LAB — KAPPA/LAMBDA LIGHT CHAINS
Kappa free light chain: 16.3 mg/L (ref 3.3–19.4)
Kappa, lambda light chain ratio: 2.33 — ABNORMAL HIGH (ref 0.26–1.65)
Lambda free light chains: 7 mg/L (ref 5.7–26.3)

## 2020-12-20 LAB — PROTEIN ELECTROPHORESIS, SERUM
A/G Ratio: 1.6 (ref 0.7–1.7)
Albumin ELP: 3.8 g/dL (ref 2.9–4.4)
Alpha-1-Globulin: 0.2 g/dL (ref 0.0–0.4)
Alpha-2-Globulin: 0.5 g/dL (ref 0.4–1.0)
Beta Globulin: 1 g/dL (ref 0.7–1.3)
Gamma Globulin: 0.6 g/dL (ref 0.4–1.8)
Globulin, Total: 2.4 g/dL (ref 2.2–3.9)
Total Protein ELP: 6.2 g/dL (ref 6.0–8.5)

## 2020-12-24 LAB — IMMUNOFIXATION ELECTROPHORESIS
IgA: 208 mg/dL (ref 90–386)
IgG (Immunoglobin G), Serum: 651 mg/dL (ref 603–1613)
IgM (Immunoglobulin M), Srm: 31 mg/dL (ref 20–172)
Total Protein ELP: 6.3 g/dL (ref 6.0–8.5)

## 2020-12-26 ENCOUNTER — Ambulatory Visit (HOSPITAL_COMMUNITY)
Admission: RE | Admit: 2020-12-26 | Discharge: 2020-12-26 | Disposition: A | Discharge status: Home / Self Care | Payer: Medicare Other | Source: Ambulatory Visit | Attending: Hematology and Oncology | Admitting: Hematology and Oncology

## 2020-12-26 ENCOUNTER — Encounter (HOSPITAL_COMMUNITY): Payer: Self-pay | Admitting: Hematology and Oncology

## 2020-12-26 ENCOUNTER — Inpatient Hospital Stay (HOSPITAL_BASED_OUTPATIENT_CLINIC_OR_DEPARTMENT_OTHER): Payer: Medicare Other | Admitting: Hematology and Oncology

## 2020-12-26 ENCOUNTER — Other Ambulatory Visit: Payer: Self-pay

## 2020-12-26 ENCOUNTER — Inpatient Hospital Stay (HOSPITAL_COMMUNITY): Payer: Medicare Other

## 2020-12-26 VITALS — BP 142/74 | HR 70 | Temp 96.9°F | Wt 168.4 lb

## 2020-12-26 DIAGNOSIS — M546 Pain in thoracic spine: Secondary | ICD-10-CM | POA: Diagnosis not present

## 2020-12-26 DIAGNOSIS — C9001 Multiple myeloma in remission: Secondary | ICD-10-CM | POA: Diagnosis not present

## 2020-12-26 DIAGNOSIS — G893 Neoplasm related pain (acute) (chronic): Secondary | ICD-10-CM

## 2020-12-26 DIAGNOSIS — R17 Unspecified jaundice: Secondary | ICD-10-CM | POA: Diagnosis not present

## 2020-12-26 DIAGNOSIS — G8929 Other chronic pain: Secondary | ICD-10-CM

## 2020-12-26 DIAGNOSIS — D61818 Other pancytopenia: Secondary | ICD-10-CM | POA: Diagnosis not present

## 2020-12-26 DIAGNOSIS — C9000 Multiple myeloma not having achieved remission: Secondary | ICD-10-CM | POA: Diagnosis not present

## 2020-12-26 MED ORDER — DEXAMETHASONE 4 MG PO TABS: 16.0000 mg | ORAL_TABLET | ORAL | 4 refills | Status: DC

## 2020-12-26 MED ORDER — METHADONE HCL 10 MG PO TABS: 10.0000 mg | ORAL_TABLET | Freq: Two times a day (BID) | ORAL | 0 refills | Status: DC

## 2020-12-26 NOTE — Progress Notes (Signed)
[name] FOLLOW-UP progress notes  Patient Care Team: Monico Blitz, MD as PCP - General (Internal Medicine)  CHIEF COMPLAINTS/PURPOSE OF VISIT:  Multiple myeloma, for further evaluation  HISTORY OF PRESENTING ILLNESS:  Steven [name] 60 y.o. male is seen today because his primary oncologist is not available He is currently receiving Revlimid along with dexamethasone for maintenance treatment He is not on bisphosphonate due to history of osteonecrosis of the jaw He has not been taking calcium with vitamin D supplement on a regular basis He stated he has been having acute intermittent left hip pain especially when he bends down to pick up objects or to do gardening He denies recent falls  I reviewed the patient's records extensive and collaborated the history with the patient. Summary of his history is as follows: Oncology History  Multiple myeloma in remission ([place])  06/05/2003 Initial Diagnosis   Myeloma, presenting with excruciating back pain and T12 vertebral body fracture, kappa light chain multiple myeloma diagnosed with 2030 mg light chains per 24 hours and urine    06/09/2003 - 06/22/2003 Radiation Therapy   19 [place] to T11-L1 or T12 vertebral body fracture. [place] T2-T4 for T3 vertebral body involvement    06/26/2003 - 07/25/2004 Chemotherapy   Doxil, Velcade, and dexamethasone     02/27/2004 Bone Marrow Biopsy   Normal cellular bone marrow showing trilineage hematopoiesis with less than 10% plasma cells.    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  Followed by [facility] Department of dentistry.    06/22/2008 Progression   Relapse with The light chains in 24-hour urine found, therapy reinitiated with Revlimid/dexamethasone.    07/31/2008 - 08/10/2013 Chemotherapy    Revlimid/dexamethasone initiated for recurrent disease.     08/27/2010 - 09/10/2010 Radiation Therapy   2500 cGy to right hip for impending right femoral neck fracture    08/04/2013 Imaging   Bone density-normal with T score of -1.0.    01/19/2014 Imaging   MRI pelvis-partially visualized right L4-L5 and L5-S1 facet arthritis with periarticular edema. This can be associated with referred pain to the right hip. Small abnormal marrow signal foci in the proximal right femur are likely representing treated myeloma. There is no cortical erosion, stress reaction or stress fracture. Similar lesions are present in the right supra-acetabular ileum.    02/08/2014 Imaging   Bone survey-no significant change in widespread myelomatous involvement of skeleton. Multiple pathologic fractures in the thoracic spine at L1 appears stable. No interval pathologic fracture identified.    12/26/2015 Tumor Marker   M-spike NOT OBSERVED; normal IgG, IgM, IgA, & kappa/lambda light chains.      02/26/2016 Treatment Plan Change   Transfer of medical oncology care to [facility]    02/26/2016 Tumor Marker   M-spike NOT OBSERVED; normal IgG, IgM, IgA, & kappa/lambda light chains.    03/22/2016 Imaging   Skeletal survey imaging: IMPRESSION: Stable appearance of widespread myelomatous involvement of the skeleton. No progressive lesions are observed. Stable appearing partial compressions of thoracic vertebral bodies. Stable vertebra plana at T12.    05/29/2016 Tumor Marker   M-spike NOT OBSERVED; normal IgG, IgM, IgA, & kappa/lambda light chains.    12/02/2019 Genetic Testing   FISH analysis       MEDICAL HISTORY:  Past Medical History:  Diagnosis  Date   Multiple myeloma in remission ([place]) 11/26/2005    SURGICAL HISTORY: Past Surgical History:  Procedure Laterality Date   BACK SURGERY     CYSTOSCOPY/URETEROSCOPY/HOLMIUM LASER/STENT PLACEMENT Left 10/09/2020   Procedure: CYSTOSCOPY/LEFT  RETROGRADE/URETEROSCOPY/HOLMIUM LASER/LEFT STENT PLACEMENT;  Surgeon: Festus Aloe, MD;  [facility];  Service: Urology;  Laterality: Left;  ONLY NEEDS 60 MIN   porth cath      SOCIAL HISTORY: Social History   Socioeconomic History   Marital status: Married    Spouse name: Not on file   Number of children: Not on file   Years of education: Not on file   Highest education level: Not on file  Occupational History   Not on file  Tobacco Use   Smoking status: Never   Smokeless tobacco: Never  Vaping Use   Vaping Use: Never used  Substance and Sexual Activity   Alcohol use: No   Drug use: No   Sexual activity: Not on file  Other Topics Concern   Not on file  Social History Narrative   Not on file   Social Determinants of Health   Financial Resource Strain: Low Risk    Difficulty of Paying Living Expenses: Not hard at all  Food Insecurity: No Food Insecurity   Worried About Estate manager/land agent of Food in the Last Year: Never true   [place] in the Last Year: Never true  Transportation Needs: No Transportation Needs   Lack of Transportation (Medical): No   Lack of Transportation (Non-Medical): No  Physical Activity: Inactive   Days of Exercise per Week: 0 days   Minutes of Exercise per Session: 0 min  Stress: No Stress Concern Present   Feeling of Stress : Not at all  Social Connections: Moderately Integrated   Frequency of Communication with Friends and Family: More than three times a week   Frequency of Social Gatherings with Friends and Family: Twice a week   Attends Religious Services: 1 to 4 times per year   Active Member of [employer] or Organizations: No   Attends Music therapist: Never   Marital Status: Married  Human resources officer Violence: Not At Risk   Fear of Current or Ex-Partner: No   Emotionally Abused: No   Physically Abused: No   Sexually Abused: No    FAMILY HISTORY: Family History  Problem Relation Age of Onset    Diabetes Mother    Cancer Sister     ALLERGIES:  is allergic to contrast media [iodinated diagnostic agents].  MEDICATIONS:  Current Outpatient Medications  Medication Sig Dispense Refill   ASPIRIN 81 PO Take 81 mg by mouth daily.     CALCIUM PO Take 400 mg by mouth daily.     chlorhexidine (PERIDEX) 0.12 % solution USE AS DIRECTED 15 MLS IN THE MOUTH OR THROAT 2 (TWO) TIMES DAILY. (Patient taking differently: Use as directed 15 mLs in the mouth or throat daily.) 473 mL 2   diphenhydrAMINE (BENADRYL) 50 MG tablet Take 50 mg (2 tablets) by mouth 1 hour prior to dye study procedure. 1 tablet 0   folic acid (FOLVITE) 1 MG tablet Take 1 mg by mouth daily.     KLOR-CON M20 20 MEQ tablet TAKE 1 TABLET BY MOUTH EVERY DAY (Patient taking differently: Take 20 mEq by mouth daily.) 90 tablet 1   lenalidomide (REVLIMID) 15 MG capsule TAKE 1 CAPSULE DAILY FOR 21 DAYS ON AND 7 DAYS OFF 21 capsule 0  polyethylene glycol (MIRALAX / GLYCOLAX) packet Take 17 g by mouth daily as needed for mild constipation or moderate constipation.     Pramox-PE-Glycerin-Petrolatum 1-0.25-14.4-15 % CREA Place 1 application rectally daily as needed (Irritation).     predniSONE (DELTASONE) 50 MG tablet Take 50 mg (1 tablet) by mouth 13 hours, 7 hours, and 1 hour prior to dye study procedure 3 tablet 0   dexamethasone (DECADRON) 4 MG tablet Take 4 tablets (16 mg total) by mouth once a week. Tuesday 80 tablet 4   lidocaine-prilocaine (EMLA) cream Apply 1 application topically as needed [facility]). (Patient not taking: Reported on 12/26/2020)     methadone (DOLOPHINE) 10 MG tablet Take 1 tablet (10 mg total) by mouth every 12 (twelve) hours. 60 tablet 0   No current facility-administered medications for this visit.   Facility-Administered Medications Ordered in Other Visits  Medication Dose Route Frequency Provider Last Rate Last Admin   sodium chloride flush (NS) 0.9 % injection 10 mL  10 mL Intravenous PRN Baird [facility],  PA-C   10 mL at 06/19/17 1029    REVIEW OF SYSTEMS:   Constitutional: Denies fevers, chills or abnormal night sweats Eyes: Denies blurriness of vision, double vision or watery eyes Ears, nose, mouth, throat, and face: Denies mucositis or sore throat Respiratory: Denies cough, dyspnea or wheezes Cardiovascular: Denies palpitation, chest discomfort or lower extremity swelling Gastrointestinal:  Denies nausea, heartburn or change in bowel habits Skin: Denies abnormal skin rashes Lymphatics: Denies new lymphadenopathy or easy bruising Neurological:Denies numbness, tingling or new weaknesses Behavioral/Psych: Mood is stable, no new changes  All other systems were reviewed with the patient and are negative.  PHYSICAL EXAMINATION: ECOG PERFORMANCE STATUS: 1 - Symptomatic but completely ambulatory  Vitals:   12/26/20 1533  BP: (!) 142/74  Pulse: 70  Temp: (!) 96.9 F (36.1 C)  SpO2: 100%   Filed Weights   12/26/20 1533  Weight: 168 lb 6.4 oz (76.4 kg)    GENERAL:alert, no distress and comfortable SKIN: skin color, texture, turgor are normal, no rashes or significant lesions EYES: normal, conjunctiva are pink and non-injected, sclera clear OROPHARYNX:no exudate, normal lips, buccal mucosa, and tongue  NECK: supple, thyroid normal size, non-tender, without nodularity LYMPH:  no palpable lymphadenopathy in the cervical, axillary or inguinal LUNGS: clear to auscultation and percussion with normal breathing effort HEART: regular rate & rhythm and no murmurs without lower extremity edema ABDOMEN:abdomen soft, non-tender and normal bowel sounds Musculoskeletal:no cyanosis of digits and no clubbing  PSYCH: alert & oriented x 3 with fluent speech NEURO: no focal motor/sensory deficits  LABORATORY DATA:  I have reviewed the data as listed Lab Results  Component Value Date   WBC 5.2 12/19/2020   HGB 11.7 (L) 12/19/2020   HCT 38.1 (L) 12/19/2020   MCV 82.5 12/19/2020   PLT 103 (L)  12/19/2020   Recent Labs    01/02/20 1431 02/14/20 1006 03/20/20 1022 06/20/20 1415 09/18/20 1008 12/19/20 1441  NA 138 138   < > 139 142 143  K 3.7 3.8   < > 3.8 3.6 3.9  CL 106 106   < > 108 107 111  CO2 25 23   < > 22 25 26   GLUCOSE 93 109*   < > 139* 125* 95  BUN 14 10   < > 18 12 17   CREATININE 0.89 0.96   < > 0.85 0.95 0.93  CALCIUM 8.6* 8.6*   < > 9.1 9.2 9.3  GFRNONAA >60 >  60   < > >60 >60 >60  GFRAA >60 >60  --   --   --   --   PROT 7.1 7.2   < > 6.8 6.5 6.5  ALBUMIN 4.1 4.0   < > 4.0 4.0 3.9  AST 18 17   < > 16 19 16   ALT 16 15   < > 14 13 12   ALKPHOS 41 40   < > 42 34* 34*  BILITOT 1.0 1.6*   < > 1.5* 1.8* 1.7*   < > = values in this interval not displayed.    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 Delton Coombes next week  Pancytopenia, acquired [facility]) He has stable pancytopenia likely due to Revlimid Observe closely for now  Cancer associated pain He has multifactorial pain I refilled his prescription pain medicine I will order imaging study to evaluate his unilateral left hip pain I recommend calcium with vitamin D supplement  Serum total bilirubin elevated Likely due to Gilbert's  disease His previous PET CT scan showed no abnormalities in his liver Monitor closely  Orders Placed This Encounter  Procedures   DG Bone Survey Met    Standing Status:   Future    Number of Occurrences:   1    Standing Expiration Date:   12/26/2021    Order Specific Question:   Reason for Exam (SYMPTOM  OR DIAGNOSIS REQUIRED)    Answer:   staging myeloma, severe left hip pain    Order Specific Question:   Preferred imaging location?    Answer:   [facility]   CBC with Differential    Standing Status:   Standing    Number of Occurrences:   10    Standing Expiration Date:   12/26/2021   Comprehensive metabolic panel    Standing Status:   Standing    Number of Occurrences:   10    Standing Expiration Date:   12/26/2021   Magnesium    Standing Status:   Standing    Number of Occurrences:   10    Standing Expiration Date:   12/26/2021   Lactate dehydrogenase    Standing Status:   Standing    Number of Occurrences:   10    Standing Expiration Date:   12/26/2021   Kappa/lambda light chains    Standing Status:   Standing    Number of Occurrences:   10    Standing Expiration Date:   12/26/2021   Protein electrophoresis, serum    Standing Status:   Standing    Number of Occurrences:   10    Standing Expiration Date:   12/26/2021    All questions were answered. The  patient knows to call the clinic with any problems, questions or concerns. The total time spent in the appointment was 30 minutes encounter with patients including review of chart and various tests results, discussions about plan of care and coordination of care plan   Heath Lark, MD 12/26/2020 5:06 PM

## 2020-12-26 NOTE — Progress Notes (Signed)
Port flush offered to patient and he states that he does not want to have it flushed today.  Will reschedule for another time.

## 2020-12-26 NOTE — Assessment & Plan Note (Signed)
He has stable pancytopenia likely due to Revlimid Observe closely for now

## 2020-12-26 NOTE — Assessment & Plan Note (Signed)
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 Delton Coombes next week

## 2020-12-26 NOTE — Assessment & Plan Note (Signed)
Likely due to Gilbert's disease His previous PET CT scan showed no abnormalities in his liver Monitor closely

## 2020-12-26 NOTE — Assessment & Plan Note (Signed)
He has multifactorial pain I refilled his prescription pain medicine I will order imaging study to evaluate his unilateral left hip pain I recommend calcium with vitamin D supplement

## 2021-01-02 ENCOUNTER — Inpatient Hospital Stay (HOSPITAL_BASED_OUTPATIENT_CLINIC_OR_DEPARTMENT_OTHER): Payer: Medicare Other | Admitting: Hematology

## 2021-01-02 ENCOUNTER — Encounter (HOSPITAL_COMMUNITY): Payer: Self-pay | Admitting: *Deleted

## 2021-01-02 DIAGNOSIS — C9001 Multiple myeloma in remission: Secondary | ICD-10-CM | POA: Diagnosis not present

## 2021-01-02 NOTE — Progress Notes (Signed)
Virtual Visit via Telephone Note  I connected with Cheryll Dessert on 01/02/21 at 11:20 AM EDT by telephone and verified that I am speaking with the correct person using two identifiers.  Location: Patient: At home Provider: At home   I discussed the limitations, risks, security and privacy concerns of performing an evaluation and management service by telephone and the availability of in person appointments. I also discussed with the patient that there may be a patient responsible charge related to this service. The patient expressed understanding and agreed to proceed.   History of Present Illness: He is being followed in our office for multiple myeloma not in remission.   Observations/Objective: He reports left hip pain in the back and side, for the last 2 years.  He gets the pain only on bending forward.  Pain eases off on standing in 10 to 15 minutes.  He does not require any pain medication.  He reports that pain has improved slightly since the bladder stone was removed.  Does not report any other pains.  He is tolerating Revlimid and dexamethasone very well.  Assessment and Plan:  1.  Relapsed multiple myeloma: - Continue Revlimid 15 mg 3 weeks on 1 week off along with dexamethasone 20 mg weekly. - Reviewed myeloma results from 12/19/2020, SPEP negative.  Immunofixation normal.  Free light chain ratio improved to 2.53 from 2.92 previously. - Reviewed skeletal survey from 12/26/2020.  No significant changes.  No significant left hip pathology to warrant any further investigation at this time. - He was told to call us back if the pain gets worse.  Will consider MRI of the hip/L-spine at that time. - RTC 3 months with repeat myeloma labs 1 week prior.   Follow Up Instructions: RTC 3 months with labs 1 week prior   I discussed the assessment and treatment plan with the patient. The patient was provided an opportunity to ask questions and all were answered. The patient agreed with  the plan and demonstrated an understanding of the instructions.   The patient was advised to call back or seek an in-person evaluation if the symptoms worsen or if the condition fails to improve as anticipated.  I provided 12 minutes of non-face-to-face time during this encounter.   Derek Jack, MD

## 2021-01-16 ENCOUNTER — Other Ambulatory Visit (HOSPITAL_COMMUNITY): Payer: Self-pay | Admitting: Hematology

## 2021-01-16 DIAGNOSIS — C9002 Multiple myeloma in relapse: Secondary | ICD-10-CM

## 2021-01-16 NOTE — Telephone Encounter (Signed)
Chart reviewed. Revlimid refilled per last office note with Dr. Katragadda.  

## 2021-01-17 DIAGNOSIS — Z23 Encounter for immunization: Secondary | ICD-10-CM | POA: Diagnosis not present

## 2021-01-18 IMAGING — CT CT BIOPSY
1 of 2 series · 15 of 30 positions shown, 19 images · non-contrast
Comparison: none

INDICATION: History of multiple myeloma. Please perform CT-guided bone marrow
biopsy tissue diagnostic purposes.

[Series 2: i-spiral 5.0 b40f · axial · 0.98mm/px · z∈[-516,-449]mm · 15 of 23 slices shown, 19 images]
[im 2/23  mediastinal]
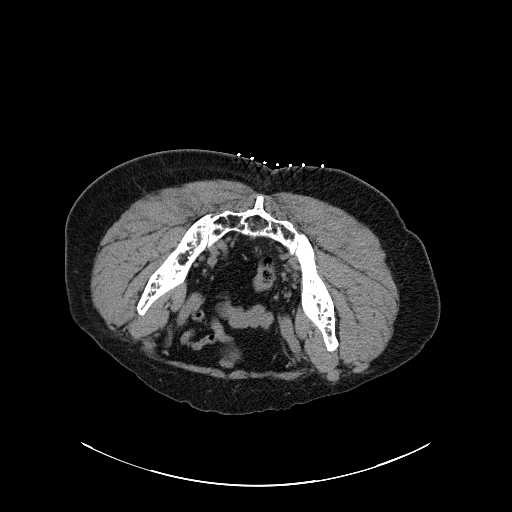
[im 2/23  lung]
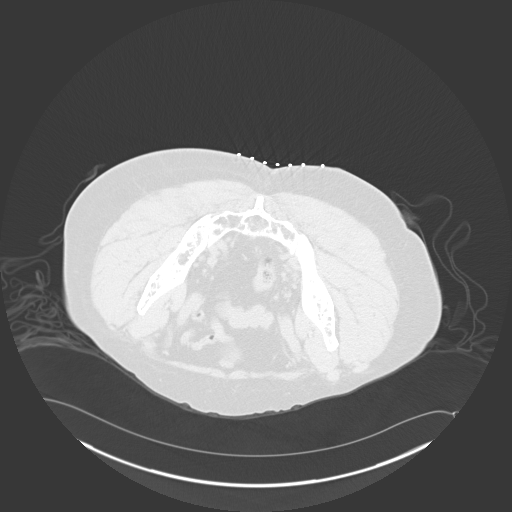
[im 4/23  lung]
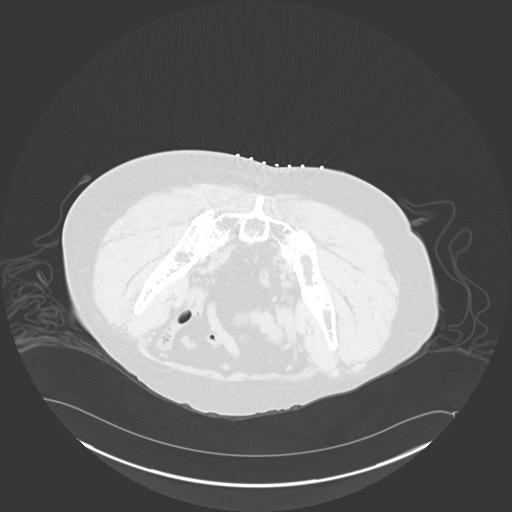
[im 5/23  lung]
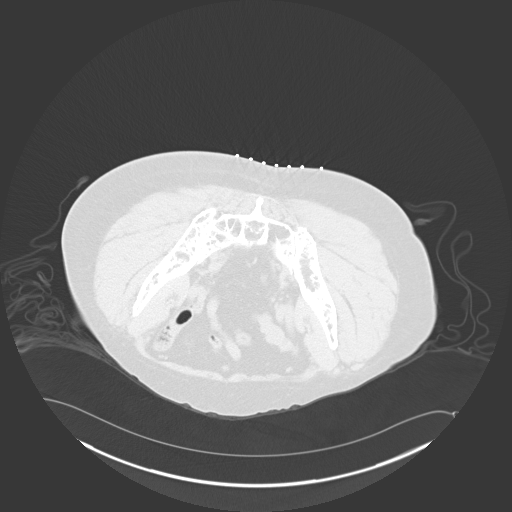
[im 6/23  lung]
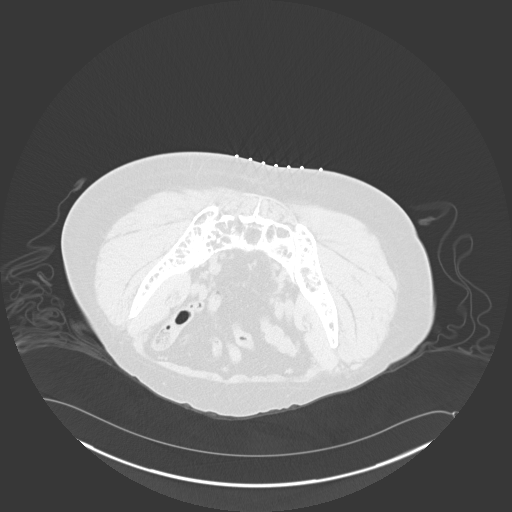
[im 8/23  mediastinal]
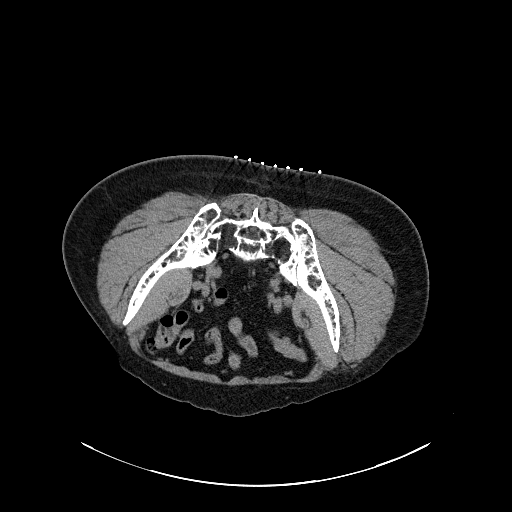
[im 8/23  lung]
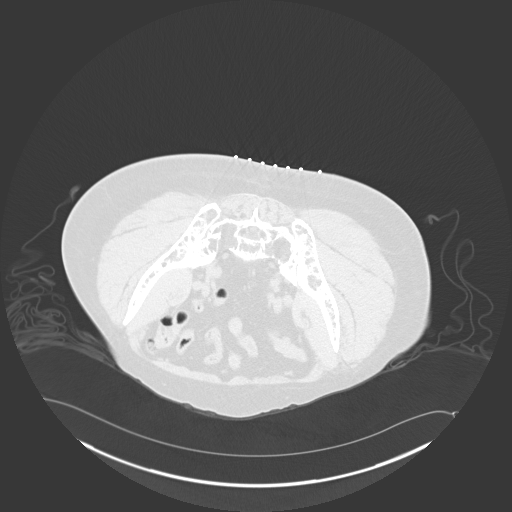
[im 9/23  lung]
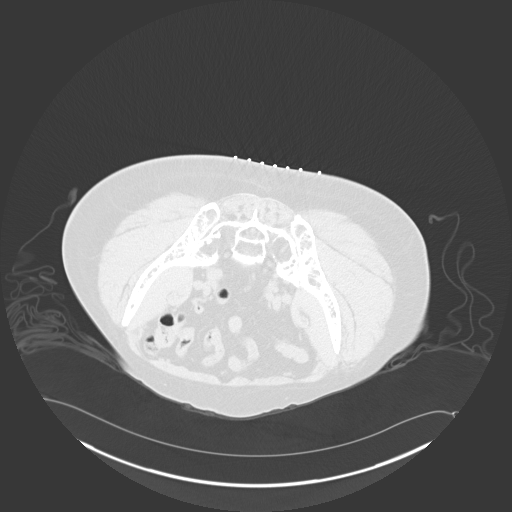
[im 10/23  lung]
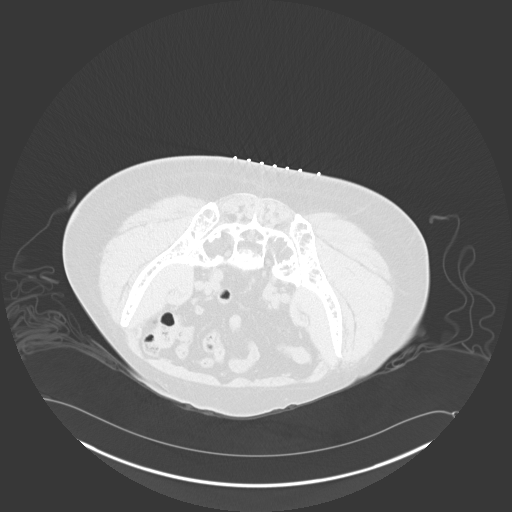
[im 12/23  lung]
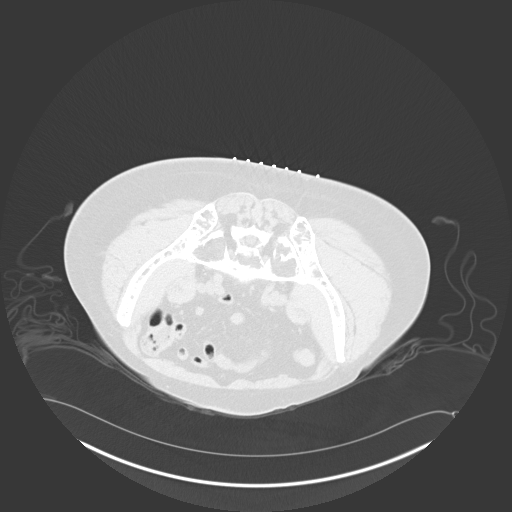
[im 13/23  mediastinal]
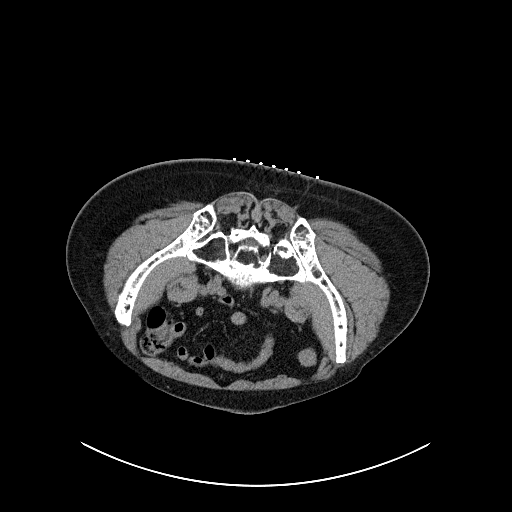
[im 13/23  lung]
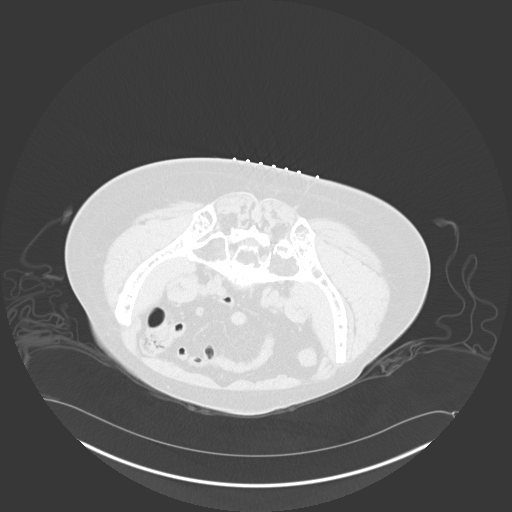
[im 14/23  lung]
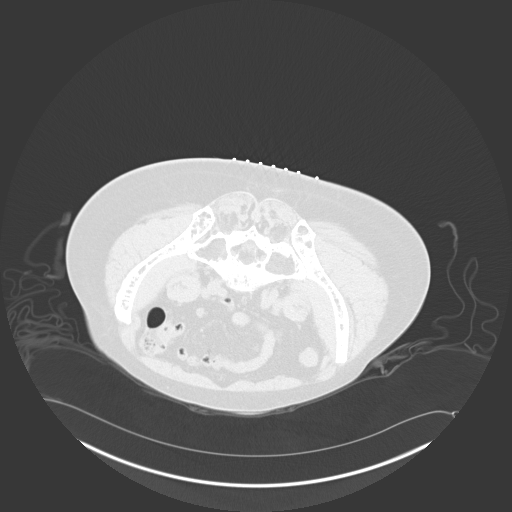
[im 16/23  lung]
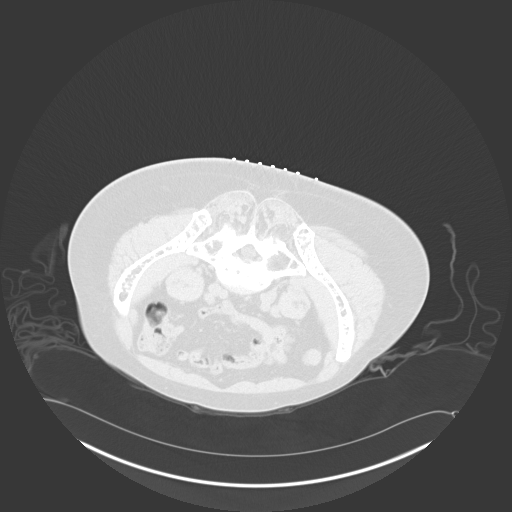
[im 17/23  lung]
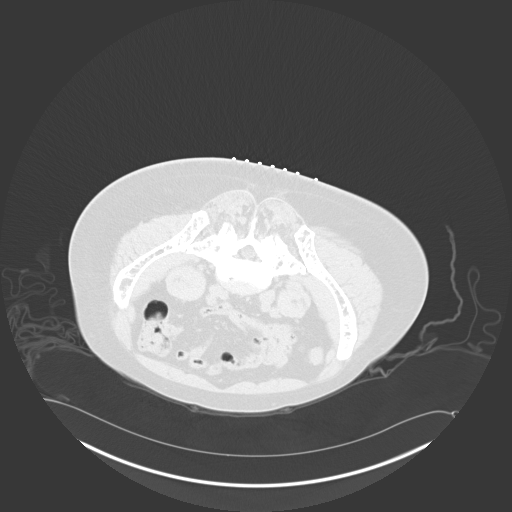
[im 18/23  mediastinal]
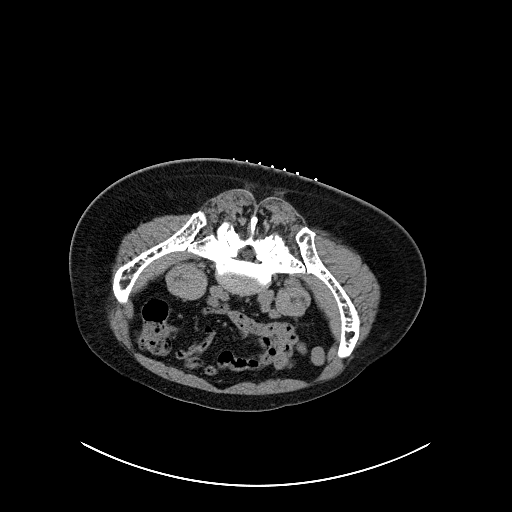
[im 18/23  lung]
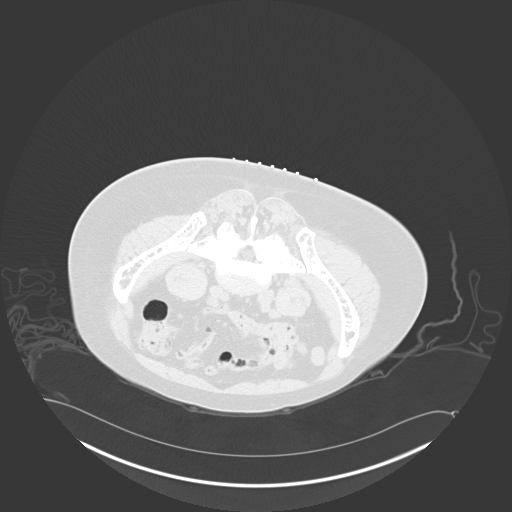
[im 20/23  lung]
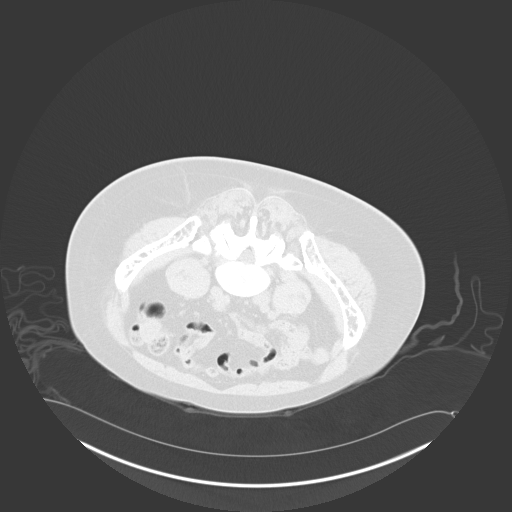
[im 21/23  lung]
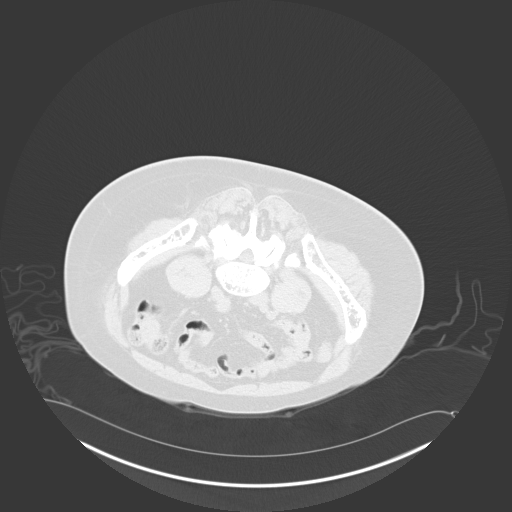

[15 of 30 positions shown; findings below may reference images not displayed]

EXAM:
CT-GUIDED BONE MARROW BIOPSY AND ASPIRATION

MEDICATIONS:
None

ANESTHESIA/SEDATION:
Fentanyl 100 mcg IV; Versed 2 mg IV

Sedation Time: 11 Minutes; The patient was continuously monitored
during the procedure by the interventional radiology nurse under my
direct supervision.

COMPLICATIONS:
None immediate.

PROCEDURE:
Informed consent was obtained from the patient following an
explanation of the procedure, risks, benefits and alternatives. The
patient understands, agrees and consents for the procedure. All
questions were addressed. A time out was performed prior to the
initiation of the procedure.

The patient was positioned prone and non-contrast localization CT
was performed of the pelvis to demonstrate the iliac marrow spaces.
The operative site was prepped and draped in the usual sterile
fashion.

Under sterile conditions and local anesthesia, a 22 gauge spinal
needle was utilized for procedural planning. Next, an 11 gauge
coaxial bone biopsy needle was advanced into the left iliac marrow
space. Needle position was confirmed with CT imaging. Initially, a
bone marrow aspiration was performed. Next, a bone marrow biopsy was
obtained with the 11 gauge outer bone marrow device. The 11 gauge
coaxial bone biopsy needle was re-advanced into a slightly different
location within the left iliac marrow space, positioning was
confirmed with CT imaging and an additional bone marrow biopsy was
obtained. Samples were prepared with the cytotechnologist and deemed
adequate. The needle was removed and superficial hemostasis was
obtained with manual compression. A dressing was applied. The
patient tolerated the procedure well without immediate post
procedural complication.
IMPRESSION: Successful CT guided left iliac bone marrow aspiration and core
biopsy.

## 2021-01-24 ENCOUNTER — Other Ambulatory Visit (HOSPITAL_COMMUNITY): Payer: Self-pay | Admitting: *Deleted

## 2021-01-24 DIAGNOSIS — G8929 Other chronic pain: Secondary | ICD-10-CM

## 2021-01-24 DIAGNOSIS — C9001 Multiple myeloma in remission: Secondary | ICD-10-CM

## 2021-01-24 MED ORDER — METHADONE HCL 10 MG PO TABS: 10.0000 mg | ORAL_TABLET | Freq: Two times a day (BID) | ORAL | 0 refills | Status: DC

## 2021-02-14 ENCOUNTER — Other Ambulatory Visit (HOSPITAL_COMMUNITY): Payer: Self-pay

## 2021-02-14 DIAGNOSIS — C9002 Multiple myeloma in relapse: Secondary | ICD-10-CM

## 2021-02-14 MED ORDER — LENALIDOMIDE 15 MG PO CAPS: ORAL_CAPSULE | ORAL | 0 refills | Status: DC

## 2021-02-14 NOTE — Telephone Encounter (Signed)
Chart reviewed. Revlimid refilled per last office visit note with Dr. Delton Coombes

## 2021-02-22 ENCOUNTER — Other Ambulatory Visit (HOSPITAL_COMMUNITY): Payer: Self-pay | Admitting: Surgery

## 2021-02-22 DIAGNOSIS — G8929 Other chronic pain: Secondary | ICD-10-CM

## 2021-02-22 DIAGNOSIS — C9001 Multiple myeloma in remission: Secondary | ICD-10-CM

## 2021-02-22 MED ORDER — METHADONE HCL 10 MG PO TABS: 10.0000 mg | ORAL_TABLET | Freq: Two times a day (BID) | ORAL | 0 refills | Status: DC

## 2021-03-13 ENCOUNTER — Other Ambulatory Visit (HOSPITAL_COMMUNITY): Payer: Self-pay

## 2021-03-13 DIAGNOSIS — C9002 Multiple myeloma in relapse: Secondary | ICD-10-CM

## 2021-03-13 MED ORDER — LENALIDOMIDE 15 MG PO CAPS: ORAL_CAPSULE | ORAL | 0 refills | Status: DC

## 2021-03-13 NOTE — Telephone Encounter (Signed)
Chart reviewed. Revlimid refilled per last office note with Dr. Katragadda.  

## 2021-03-26 ENCOUNTER — Other Ambulatory Visit (HOSPITAL_COMMUNITY): Payer: Self-pay | Admitting: *Deleted

## 2021-03-26 DIAGNOSIS — C9001 Multiple myeloma in remission: Secondary | ICD-10-CM

## 2021-03-26 DIAGNOSIS — G8929 Other chronic pain: Secondary | ICD-10-CM

## 2021-03-26 DIAGNOSIS — M546 Pain in thoracic spine: Secondary | ICD-10-CM

## 2021-03-26 MED ORDER — METHADONE HCL 10 MG PO TABS
10.0000 mg | ORAL_TABLET | Freq: Two times a day (BID) | ORAL | 0 refills | Status: DC
Start: 2021-03-26 — End: 2021-04-25

## 2021-04-01 ENCOUNTER — Other Ambulatory Visit: Payer: Self-pay

## 2021-04-01 ENCOUNTER — Inpatient Hospital Stay (HOSPITAL_COMMUNITY): Payer: Medicare Other | Attending: Hematology

## 2021-04-01 DIAGNOSIS — D696 Thrombocytopenia, unspecified: Secondary | ICD-10-CM | POA: Insufficient documentation

## 2021-04-01 DIAGNOSIS — C9002 Multiple myeloma in relapse: Secondary | ICD-10-CM | POA: Insufficient documentation

## 2021-04-01 DIAGNOSIS — C9001 Multiple myeloma in remission: Secondary | ICD-10-CM

## 2021-04-01 DIAGNOSIS — Z7982 Long term (current) use of aspirin: Secondary | ICD-10-CM | POA: Insufficient documentation

## 2021-04-01 LAB — CBC WITH DIFFERENTIAL/PLATELET
Abs Immature Granulocytes: 0.01 10*3/uL (ref 0.00–0.07)
Basophils Absolute: 0 10*3/uL (ref 0.0–0.1)
Basophils Relative: 1 %
Eosinophils Absolute: 0.1 10*3/uL (ref 0.0–0.5)
Eosinophils Relative: 3 %
HCT: 40.1 % (ref 39.0–52.0)
Hemoglobin: 12.1 g/dL — ABNORMAL LOW (ref 13.0–17.0)
Immature Granulocytes: 0 %
Lymphocytes Relative: 47 %
Lymphs Abs: 1.4 10*3/uL (ref 0.7–4.0)
MCH: 24.6 pg — ABNORMAL LOW (ref 26.0–34.0)
MCHC: 30.2 g/dL (ref 30.0–36.0)
MCV: 81.7 fL (ref 80.0–100.0)
Monocytes Absolute: 0.4 10*3/uL (ref 0.1–1.0)
Monocytes Relative: 14 %
Neutro Abs: 1 10*3/uL — ABNORMAL LOW (ref 1.7–7.7)
Neutrophils Relative %: 35 %
Platelets: 122 10*3/uL — ABNORMAL LOW (ref 150–400)
RBC: 4.91 MIL/uL (ref 4.22–5.81)
RDW: 17.6 % — ABNORMAL HIGH (ref 11.5–15.5)
WBC: 2.9 10*3/uL — ABNORMAL LOW (ref 4.0–10.5)
nRBC: 0 % (ref 0.0–0.2)

## 2021-04-01 LAB — COMPREHENSIVE METABOLIC PANEL
ALT: 12 U/L (ref 0–44)
AST: 15 U/L (ref 15–41)
Albumin: 3.8 g/dL (ref 3.5–5.0)
Alkaline Phosphatase: 34 U/L — ABNORMAL LOW (ref 38–126)
Anion gap: 5 (ref 5–15)
BUN: 14 mg/dL (ref 6–20)
CO2: 28 mmol/L (ref 22–32)
Calcium: 8.8 mg/dL — ABNORMAL LOW (ref 8.9–10.3)
Chloride: 106 mmol/L (ref 98–111)
Creatinine, Ser: 0.86 mg/dL (ref 0.61–1.24)
GFR, Estimated: >60 mL/min (ref >60)
Glucose, Bld: 94 mg/dL (ref 70–99)
Potassium: 3.1 mmol/L — ABNORMAL LOW (ref 3.5–5.1)
Sodium: 139 mmol/L (ref 135–145)
Total Bilirubin: 1.5 mg/dL — ABNORMAL HIGH (ref 0.3–1.2)
Total Protein: 6.4 g/dL — ABNORMAL LOW (ref 6.5–8.1)

## 2021-04-01 LAB — MAGNESIUM: Magnesium: 1.9 mg/dL (ref 1.7–2.4)

## 2021-04-01 LAB — LACTATE DEHYDROGENASE: LDH: 115 U/L (ref 98–192)

## 2021-04-03 LAB — KAPPA/LAMBDA LIGHT CHAINS
Kappa free light chain: 32.7 mg/L — ABNORMAL HIGH (ref 3.3–19.4)
Kappa, lambda light chain ratio: 3.37 — ABNORMAL HIGH (ref 0.26–1.65)
Lambda free light chains: 9.7 mg/L (ref 5.7–26.3)

## 2021-04-06 NOTE — Progress Notes (Signed)
Steven [name], [place] [address]   CLINIC:  Medical Oncology/Hematology  PCP:  Steven Blitz, MD [address] [place] [address] [phone]   REASON FOR VISIT:  Follow-up for multiple myeloma  PRIOR THERAPY:  1. 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 2. Revlimid maintenance until 2015.  CURRENT THERAPY: Revlimid 3/4 weeks; Decadron weekly  BRIEF ONCOLOGIC HISTORY:  Oncology History  Multiple myeloma in remission ([place])  06/05/2003 Initial Diagnosis   Myeloma, presenting with excruciating back pain and T12 vertebral body fracture, kappa light chain multiple myeloma diagnosed with 2030 mg light chains per 24 hours and urine   06/09/2003 - 06/22/2003 Radiation Therapy   19 Gray to T11-L1 or T12 vertebral body fracture. [place] T2-T4 for T3 vertebral body involvement   06/26/2003 - 07/25/2004 Chemotherapy   Doxil, Velcade, and dexamethasone    02/27/2004 Bone Marrow Biopsy   Normal cellular bone marrow showing trilineage hematopoiesis with less than 10% plasma cells.   07/25/2004 Bone Marrow Transplant   Transplant after high-dose chemotherapy with melphalan 200 mg/m, but no maintenance therapy was given, transplant at [facility].   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  Followed by [facility] Department of dentistry.   06/22/2008 Progression   Relapse with The light chains in 24-hour urine found, therapy reinitiated with Revlimid/dexamethasone.   07/31/2008 - 08/10/2013 Chemotherapy   Revlimid/dexamethasone initiated for recurrent disease.    08/27/2010 - 09/10/2010 Radiation Therapy   2500 cGy to right hip for impending right femoral neck fracture   08/04/2013 Imaging   Bone density-normal with T score of -1.0.   01/19/2014 Imaging   MRI pelvis-partially visualized right L4-L5 and L5-S1 facet arthritis with periarticular edema. This can be associated with referred pain to the right hip. Small abnormal marrow signal  foci in the proximal right femur are likely representing treated myeloma. There is no cortical erosion, stress reaction or stress fracture. Similar lesions are present in the right supra-acetabular ileum.   02/08/2014 Imaging   Bone survey-no significant change in widespread myelomatous involvement of skeleton. Multiple pathologic fractures in the thoracic spine at L1 appears stable. No interval pathologic fracture identified.   12/26/2015 Tumor Marker   M-spike NOT OBSERVED; normal IgG, IgM, IgA, & kappa/lambda light chains.     02/26/2016 Treatment Plan Change   Transfer of medical oncology care to [facility]   02/26/2016 Tumor Marker   M-spike NOT OBSERVED; normal IgG, IgM, IgA, & kappa/lambda light chains.    03/22/2016 Imaging   Skeletal survey imaging: IMPRESSION: Stable appearance of widespread myelomatous involvement of the skeleton. No progressive lesions are observed. Stable appearing partial compressions of thoracic vertebral bodies. Stable vertebra plana at T12.   05/29/2016 Tumor Marker   M-spike NOT OBSERVED; normal IgG, IgM, IgA, & kappa/lambda light chains.    12/02/2019 Genetic Testing   FISH analysis       CANCER STAGING: Cancer Staging No matching staging information was found for the patient.  INTERVAL HISTORY:  Steven [name], a 60 y.o. male, returns for routine follow-up of his multiple myeloma. Steven [name] was last seen on 12/26/2020 by Dr. Alvy [name].   Today he reports feeling good. He reports continued left hip pain which is worsened which only occurs when bending over. He denies n/v/d, and he reports stable tingling/numbness in his feet.  REVIEW OF SYSTEMS:  Review of Systems  Constitutional:  Negative for appetite  change and fatigue.  Gastrointestinal:  Negative for diarrhea, nausea and vomiting.  Musculoskeletal:  Positive for arthralgias (6/10 L side).  Neurological:  Positive for numbness (feet - stable).  All other systems reviewed  and are negative.  PAST MEDICAL/SURGICAL HISTORY:  Past Medical History:  Diagnosis Date   Multiple myeloma in remission ([place]) 11/26/2005   Past Surgical History:  Procedure Laterality Date   BACK SURGERY     CYSTOSCOPY/URETEROSCOPY/HOLMIUM LASER/STENT PLACEMENT Left 10/09/2020   Procedure: CYSTOSCOPY/LEFT RETROGRADE/URETEROSCOPY/HOLMIUM LASER/LEFT STENT PLACEMENT;  Surgeon: Steven Aloe, MD;  [facility];  Service: Urology;  Laterality: Left;  ONLY NEEDS 60 MIN   porth cath      SOCIAL HISTORY:  Social History   Socioeconomic History   Marital status: Married    Spouse name: Not on file   Number of children: Not on file   Years of education: Not on file   Highest education level: Not on file  Occupational History   Not on file  Tobacco Use   Smoking status: Never   Smokeless tobacco: Never  Vaping Use   Vaping Use: Never used  Substance and Sexual Activity   Alcohol use: No   Drug use: No   Sexual activity: Not on file  Other Topics Concern   Not on file  Social History Narrative   Not on file   Social Determinants of Health   Financial Resource Strain: Low Risk    Difficulty of Paying Living Expenses: Not hard at all  Food Insecurity: No Food Insecurity   Worried About Estate manager/land agent of Food in the Last Year: Never true   [place] in the Last Year: Never true  Transportation Needs: No Transportation Needs   Lack of Transportation (Medical): No   Lack of Transportation (Non-Medical): No  Physical Activity: Inactive   Days of Exercise per Week: 0 days   Minutes of Exercise per Session: 0 min  Stress: No Stress Concern Present   Feeling of Stress : Not at all  Social Connections: Moderately Integrated   Frequency of Communication with Friends and Family: More than three times a week   Frequency of Social Gatherings with Friends and Family: Twice a week   Attends Religious Services: 1 to 4 times per year   Active Member of  [employer] or Organizations: No   Attends Music therapist: Never   Marital Status: Married  Human resources officer Violence: Not At Risk   Fear of Current or Ex-Partner: No   Emotionally Abused: No   Physically Abused: No   Sexually Abused: No    FAMILY HISTORY:  Family History  Problem Relation Age of Onset   Diabetes Mother    Cancer Sister     CURRENT MEDICATIONS:  Current Outpatient Medications  Medication Sig Dispense Refill   ASPIRIN 81 PO Take 81 mg by mouth daily.     CALCIUM PO Take 400 mg by mouth daily.     chlorhexidine (PERIDEX) 0.12 % solution USE AS DIRECTED 15 MLS IN THE MOUTH OR THROAT 2 (TWO) TIMES DAILY. (Patient taking differently: Use as directed 15 mLs in the mouth or throat daily.) 473 mL 2   dexamethasone (DECADRON) 4 MG tablet Take 4 tablets (16 mg total) by mouth once a week. Tuesday 80 tablet 4   diphenhydrAMINE (BENADRYL) 50 MG tablet Take 50 mg (2 tablets) by mouth 1 hour prior to dye study procedure. 1 tablet 0   folic acid (FOLVITE) 1 MG  tablet Take 1 mg by mouth daily.     KLOR-CON M20 20 MEQ tablet TAKE 1 TABLET BY MOUTH EVERY DAY (Patient taking differently: Take 20 mEq by mouth daily.) 90 tablet 1   lenalidomide (REVLIMID) 15 MG capsule TAKE 1 CAPSULE DAILY FOR 21 DAYS ON AND 7 DAYS OFF. 21 capsule 0   lidocaine-prilocaine (EMLA) cream Apply 1 application topically as needed [facility]). (Patient not taking: No sig reported)     methadone (DOLOPHINE) 10 MG tablet Take 1 tablet (10 mg total) by mouth every 12 (twelve) hours. 60 tablet 0   polyethylene glycol (MIRALAX / GLYCOLAX) packet Take 17 g by mouth daily as needed for mild constipation or moderate constipation.     Pramox-PE-Glycerin-Petrolatum 1-0.25-14.4-15 % CREA Place 1 application rectally daily as needed (Irritation).     predniSONE (DELTASONE) 50 MG tablet Take 50 mg (1 tablet) by mouth 13 hours, 7 hours, and 1 hour prior to dye study procedure 3 tablet 0   No current  facility-administered medications for this visit.   Facility-Administered Medications Ordered in Other Visits  Medication Dose Route Frequency Provider Last Rate Last Admin   sodium chloride flush (NS) 0.9 % injection 10 mL  10 mL Intravenous PRN Baird Cancer, PA-C   10 mL at 06/19/17 1029    ALLERGIES:  Allergies  Allergen Reactions   Contrast Media [Iodinated Diagnostic Agents]     Burning sensation all over body    PHYSICAL EXAM:  Performance status (ECOG): 1 - Symptomatic but completely ambulatory  There were no vitals filed for this visit. Wt Readings from Last 3 Encounters:  01/02/21 168 lb (76.2 kg)  12/26/20 168 lb 6.4 oz (76.4 kg)  11/19/20 164 lb (74.4 kg)   Physical Exam Vitals reviewed.  Constitutional:      Appearance: Normal appearance.  Cardiovascular:     Rate and Rhythm: Normal rate and regular rhythm.     Pulses: Normal pulses.     Heart sounds: Normal heart sounds.  Pulmonary:     Effort: Pulmonary effort is normal.     Breath sounds: Normal breath sounds.  Neurological:     General: No focal deficit present.     Mental Status: He is alert and oriented to person, place, and time.  Psychiatric:        Mood and Affect: Mood normal.        Behavior: Behavior normal.     LABORATORY DATA:  I have reviewed the labs as listed.  CBC Latest Ref Rng & Units 04/01/2021 12/19/2020 10/05/2020  WBC 4.0 - 10.5 K/uL 2.9(L) 5.2 3.9(L)  Hemoglobin 13.0 - 17.0 g/dL 12.1(L) 11.7(L) 12.1(L)  Hematocrit 39.0 - 52.0 % 40.1 38.1(L) 39.8  Platelets 150 - 400 K/uL 122(L) 103(L) 119(L)   CMP Latest Ref Rng & Units 04/01/2021 12/19/2020 09/18/2020  Glucose 70 - 99 mg/dL 94 95 125(H)  BUN 6 - 20 mg/dL _0 Creatinine 0.61 - 1.24 mg/dL 0.86 0.93 0.95  Sodium 135 - 145 mmol/L 139 143 142  Potassium 3.5 - 5.1 mmol/L 3.1(L) 3.9 3.6  Chloride 98 - 111 mmol/L 106 111 107  CO2 22 - 32 mmol/L _1 Calcium 8.9 - 10.3 mg/dL 8.8(L) 9.3 9.2  Total Protein 6.5 - 8.1 g/dL  6.4(L) 6.5 6.5  Total Bilirubin 0.3 - 1.2 mg/dL 1.5(H) 1.7(H) 1.8(H)  Alkaline Phos 38 - 126 U/L 34(L) 34(L) 34(L)  AST 15 - 41 U/L _2 ALT  0 - 44 U/L _0 DIAGNOSTIC IMAGING:  I have independently reviewed the scans and discussed with the patient. No results found.   ASSESSMENT:  1.  Relapsed kappa light chain multiple myeloma: -Diagnosis on 06/05/2003, presentation with T12 vertebral body fracture, kappa light chain myeloma diagnosed with 2.03 g of light chains/24-hour urine. -06/09/2003 -06/22/2003 radiation 19 Gray total T11-L1, 19 grade T2-T4 for T3 vertebral body involvement. -06/26/2003 chemotherapy with Doxil, Velcade and dexamethasone. 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 -06/22/2008 relapse of myeloma with kappa light chains and 24-hour urine found, treatment initiated with Revlimid/dexamethasone from 07/31/2008 through 08/10/2013. -Radiation therapy 25 centigrade to right hip for impending right femoral neck fracture from 08/27/2010 through 09/10/2010. -PET scan on 09/12/2019 did not show any myeloma lesions. -SPEP and immunofixation on 09/12/2019 was normal.  Kappa light chains have increased to 48.  Ratio has gone up to 4.32 and steadily increasing. -Creatinine and calcium were normal.  LDH was normal. -24-hour urine shows no M spike.  Immunofixation was normal.  Elevated light chain ratio present. -Bone marrow biopsy on 12/02/2019 shows normocellular marrow with plasma cells ranging from 10-20%.  Chromosome analysis was normal.  FISH panel was negative for high-risk abnormalities. -Skeletal survey on 01/02/2020 shows possible new or better demonstrated lucencies in the T5 and T6 vertebral bodies. -Labs on 01/02/2020 shows kappa light chains 118.7, ratio of 11.2, uptrending.  SPEP is negative. -Revlimid 15 mg 3 weeks on/1 week off  with weekly dexamethasone 20 mg started on 01/30/2020.   PLAN:  1.  Relapsed multiple myeloma: -He is tolerating Revlimid 3 weeks on/1 week off reasonably well. - Myeloma labs from 12/19/2020 shows M spike is negative.  Free light chain ratio is 2.33. - Myeloma labs from 04/01/2021 shows free light chain ratio of 3.37, kappa light chains 32.7, up from 16.3.  SPEP is pending. - Continue Revlimid 15 mg 3 weeks on/1 week off. - Continue dexamethasone 20 mg weekly. - Tingling and numbness has been stable. - RTC 3 months with repeat myeloma labs.   2.  Chronic mid back pain: -Continue methadone 10 mg every 12 hours.  This is fairly well controlled.   3.  Mild thrombocytopenia: -Mild thrombocytopenia since 2017 is stable.  Platelet count today is 122.   4.  Thromboprophylaxis: -Continue aspirin 81 mg daily.      Orders placed this encounter:  No orders of the defined types were placed in this encounter.    Derek Jack, MD [place] [phone]   I, Thana Ates, am acting as a scribe for Dr. Derek Jack.  I, Derek Jack MD, have reviewed the above documentation for accuracy and completeness, and I agree with the above.

## 2021-04-08 ENCOUNTER — Inpatient Hospital Stay (HOSPITAL_BASED_OUTPATIENT_CLINIC_OR_DEPARTMENT_OTHER): Payer: Medicare Other | Admitting: Hematology

## 2021-04-08 ENCOUNTER — Other Ambulatory Visit: Payer: Self-pay

## 2021-04-08 DIAGNOSIS — D61818 Other pancytopenia: Secondary | ICD-10-CM

## 2021-04-08 DIAGNOSIS — Z7982 Long term (current) use of aspirin: Secondary | ICD-10-CM | POA: Diagnosis not present

## 2021-04-08 DIAGNOSIS — M546 Pain in thoracic spine: Secondary | ICD-10-CM

## 2021-04-08 DIAGNOSIS — G8929 Other chronic pain: Secondary | ICD-10-CM | POA: Diagnosis not present

## 2021-04-08 DIAGNOSIS — D696 Thrombocytopenia, unspecified: Secondary | ICD-10-CM | POA: Diagnosis not present

## 2021-04-08 DIAGNOSIS — C9002 Multiple myeloma in relapse: Secondary | ICD-10-CM | POA: Diagnosis not present

## 2021-04-08 DIAGNOSIS — C9001 Multiple myeloma in remission: Secondary | ICD-10-CM

## 2021-04-08 MED ORDER — SODIUM CHLORIDE 0.9% FLUSH
10.0000 mL | Freq: Once | INTRAVENOUS | Status: AC
  Administered 2021-04-08: 10 mL

## 2021-04-08 MED ORDER — HEPARIN SOD (PORK) LOCK FLUSH 100 UNIT/ML IV SOLN
500.0000 [IU] | Freq: Once | INTRAVENOUS | Status: AC
  Administered 2021-04-08: 500 [IU] via INTRAVENOUS

## 2021-04-08 NOTE — Patient Instructions (Signed)
[place] at [facility] Discharge Instructions  You were seen and examined today by Dr. Delton Coombes. He reviewed your most recent labs and everything looks good except your WBC and this could be coming from the Revlimid. He wants you to continue taking the Revlimid as prescribed. Please follow up as scheduled.   Thank you for choosing [facility] to provide your oncology and hematology care.  To afford each patient quality time with our provider, please arrive at least 15 minutes before your scheduled appointment time.   If you have a lab appointment with the [facility] please come in thru the Main Entrance and check in at the main information desk.  You need to re-schedule your appointment should you arrive 10 or more minutes late.  We strive to give you quality time with our providers, and arriving late affects you and other patients whose appointments are after yours.  Also, if you no show three or more times for appointments you may be dismissed from the clinic at the providers discretion.     Again, thank you for choosing [facility].  Our hope is that these requests will decrease the amount of time that you wait before being seen by our physicians.       _____________________________________________________________  Should you have questions after your visit to [facility], please contact our office at [phone] and follow the prompts.  Our office hours are 8:00 a.m. and 4:30 p.m. Monday - Friday.  Please note that voicemails left after 4:00 p.m. may not be returned until the following business day.  We are closed weekends and major holidays.  You do have access to a nurse 24-7, just call the main number to the clinic [phone] and do not press any options, hold on the line and a nurse will answer the phone.    For prescription refill requests, have your pharmacy contact our office and allow 72  hours.    Due to Covid, you will need to wear a mask upon entering the hospital. If you do not have a mask, a mask will be given to you at the Main Entrance upon arrival. For doctor visits, patients may have 1 support person age 8 or older with them. For treatment visits, patients can not have anyone with them due to social distancing guidelines and our immunocompromised population.

## 2021-04-08 NOTE — Progress Notes (Signed)
Patients port flushed without difficulty.  Good blood return noted with no bruising or swelling noted at site.  Band aid applied.  VSS with discharge and left in satisfactory condition with no s/s of distress noted.   

## 2021-04-10 ENCOUNTER — Other Ambulatory Visit (HOSPITAL_COMMUNITY): Payer: Self-pay

## 2021-04-10 DIAGNOSIS — C9002 Multiple myeloma in relapse: Secondary | ICD-10-CM

## 2021-04-10 LAB — PROTEIN ELECTROPHORESIS, SERUM
A/G Ratio: 1.5 (ref 0.7–1.7)
Albumin ELP: 5 g/dL — ABNORMAL HIGH (ref 2.9–4.4)
Alpha-1-Globulin: 0.3 g/dL (ref 0.0–0.4)
Alpha-2-Globulin: 0.7 g/dL (ref 0.4–1.0)
Beta Globulin: 1.6 g/dL — ABNORMAL HIGH (ref 0.7–1.3)
Gamma Globulin: 0.8 g/dL (ref 0.4–1.8)
Globulin, Total: 3.3 g/dL (ref 2.2–3.9)
Total Protein ELP: 8.3 g/dL (ref 6.0–8.5)

## 2021-04-10 MED ORDER — LENALIDOMIDE 15 MG PO CAPS: ORAL_CAPSULE | ORAL | 0 refills | Status: DC

## 2021-04-10 NOTE — Telephone Encounter (Signed)
Chart reviewed. Revlimid refilled per last office note with Dr. Katragadda.  

## 2021-04-25 ENCOUNTER — Other Ambulatory Visit (HOSPITAL_COMMUNITY): Payer: Self-pay

## 2021-04-25 DIAGNOSIS — C9001 Multiple myeloma in remission: Secondary | ICD-10-CM

## 2021-04-25 DIAGNOSIS — G8929 Other chronic pain: Secondary | ICD-10-CM

## 2021-04-26 MED ORDER — METHADONE HCL 10 MG PO TABS
10.0000 mg | ORAL_TABLET | Freq: Two times a day (BID) | ORAL | 0 refills | Status: DC
Start: 2021-04-26 — End: 2021-05-29

## 2021-05-06 ENCOUNTER — Other Ambulatory Visit (HOSPITAL_COMMUNITY): Payer: Self-pay

## 2021-05-06 DIAGNOSIS — C9002 Multiple myeloma in relapse: Secondary | ICD-10-CM

## 2021-05-06 MED ORDER — LENALIDOMIDE 15 MG PO CAPS: ORAL_CAPSULE | ORAL | 0 refills | Status: DC

## 2021-05-06 NOTE — Telephone Encounter (Signed)
Chart reviewed. Revlimid refilled per last office note with Dr. Katragadda.  

## 2021-05-24 ENCOUNTER — Other Ambulatory Visit (HOSPITAL_COMMUNITY): Payer: Self-pay

## 2021-05-24 DIAGNOSIS — C9001 Multiple myeloma in remission: Secondary | ICD-10-CM

## 2021-05-24 MED ORDER — POTASSIUM CHLORIDE CRYS ER 20 MEQ PO TBCR: 20.0000 meq | EXTENDED_RELEASE_TABLET | Freq: Every day | ORAL | 1 refills | Status: DC

## 2021-05-29 ENCOUNTER — Other Ambulatory Visit (HOSPITAL_COMMUNITY): Payer: Self-pay

## 2021-05-29 DIAGNOSIS — G8929 Other chronic pain: Secondary | ICD-10-CM

## 2021-05-29 DIAGNOSIS — C9001 Multiple myeloma in remission: Secondary | ICD-10-CM

## 2021-05-29 MED ORDER — METHADONE HCL 10 MG PO TABS: 10.0000 mg | ORAL_TABLET | Freq: Two times a day (BID) | ORAL | 0 refills | Status: DC

## 2021-06-03 ENCOUNTER — Other Ambulatory Visit (HOSPITAL_COMMUNITY): Payer: Self-pay

## 2021-06-03 DIAGNOSIS — C9002 Multiple myeloma in relapse: Secondary | ICD-10-CM

## 2021-06-03 MED ORDER — LENALIDOMIDE 15 MG PO CAPS: ORAL_CAPSULE | ORAL | 0 refills | Status: DC

## 2021-06-03 NOTE — Telephone Encounter (Signed)
Chart reviewed. Revlimid refilled per last office note with Dr. Katragadda.  

## 2021-06-27 ENCOUNTER — Other Ambulatory Visit (HOSPITAL_COMMUNITY): Payer: Self-pay

## 2021-06-27 DIAGNOSIS — G8929 Other chronic pain: Secondary | ICD-10-CM

## 2021-06-27 DIAGNOSIS — C9001 Multiple myeloma in remission: Secondary | ICD-10-CM

## 2021-06-27 MED ORDER — METHADONE HCL 10 MG PO TABS: 10.0000 mg | ORAL_TABLET | Freq: Two times a day (BID) | ORAL | 0 refills | Status: DC

## 2021-07-04 ENCOUNTER — Other Ambulatory Visit (HOSPITAL_COMMUNITY): Payer: Self-pay

## 2021-07-04 DIAGNOSIS — C9002 Multiple myeloma in relapse: Secondary | ICD-10-CM

## 2021-07-04 MED ORDER — LENALIDOMIDE 15 MG PO CAPS: ORAL_CAPSULE | ORAL | 0 refills | Status: DC

## 2021-07-04 NOTE — Telephone Encounter (Signed)
Chart reviewed. Revlimid refilled per last office note with Dr. Katragadda.  

## 2021-07-09 ENCOUNTER — Inpatient Hospital Stay (HOSPITAL_COMMUNITY): Payer: Medicare Other | Attending: Hematology

## 2021-07-09 ENCOUNTER — Other Ambulatory Visit: Payer: Self-pay

## 2021-07-09 DIAGNOSIS — D696 Thrombocytopenia, unspecified: Secondary | ICD-10-CM | POA: Diagnosis not present

## 2021-07-09 DIAGNOSIS — C9002 Multiple myeloma in relapse: Secondary | ICD-10-CM | POA: Insufficient documentation

## 2021-07-09 DIAGNOSIS — C9001 Multiple myeloma in remission: Secondary | ICD-10-CM

## 2021-07-09 DIAGNOSIS — D61818 Other pancytopenia: Secondary | ICD-10-CM

## 2021-07-09 LAB — CBC WITH DIFFERENTIAL/PLATELET
Abs Immature Granulocytes: 0.01 10*3/uL (ref 0.00–0.07)
Basophils Absolute: 0 10*3/uL (ref 0.0–0.1)
Basophils Relative: 1 %
Eosinophils Absolute: 0.1 10*3/uL (ref 0.0–0.5)
Eosinophils Relative: 3 %
HCT: 40.8 % (ref 39.0–52.0)
Hemoglobin: 12.6 g/dL — ABNORMAL LOW (ref 13.0–17.0)
Immature Granulocytes: 0 %
Lymphocytes Relative: 25 %
Lymphs Abs: 1 10*3/uL (ref 0.7–4.0)
MCH: 25 pg — ABNORMAL LOW (ref 26.0–34.0)
MCHC: 30.9 g/dL (ref 30.0–36.0)
MCV: 81 fL (ref 80.0–100.0)
Monocytes Absolute: 0.3 10*3/uL (ref 0.1–1.0)
Monocytes Relative: 7 %
Neutro Abs: 2.4 10*3/uL (ref 1.7–7.7)
Neutrophils Relative %: 64 %
Platelets: 140 10*3/uL — ABNORMAL LOW (ref 150–400)
RBC: 5.04 MIL/uL (ref 4.22–5.81)
RDW: 18 % — ABNORMAL HIGH (ref 11.5–15.5)
WBC: 3.8 10*3/uL — ABNORMAL LOW (ref 4.0–10.5)
nRBC: 0 % (ref 0.0–0.2)

## 2021-07-09 LAB — COMPREHENSIVE METABOLIC PANEL
ALT: 14 U/L (ref 0–44)
AST: 18 U/L (ref 15–41)
Albumin: 3.8 g/dL (ref 3.5–5.0)
Alkaline Phosphatase: 40 U/L (ref 38–126)
Anion gap: 6 (ref 5–15)
BUN: 9 mg/dL (ref 6–20)
CO2: 23 mmol/L (ref 22–32)
Calcium: 8.3 mg/dL — ABNORMAL LOW (ref 8.9–10.3)
Chloride: 110 mmol/L (ref 98–111)
Creatinine, Ser: 1.02 mg/dL (ref 0.61–1.24)
GFR, Estimated: >60 mL/min (ref >60)
Glucose, Bld: 112 mg/dL — ABNORMAL HIGH (ref 70–99)
Potassium: 3.5 mmol/L (ref 3.5–5.1)
Sodium: 139 mmol/L (ref 135–145)
Total Bilirubin: 1.3 mg/dL — ABNORMAL HIGH (ref 0.3–1.2)
Total Protein: 6.6 g/dL (ref 6.5–8.1)

## 2021-07-10 LAB — KAPPA/LAMBDA LIGHT CHAINS
Kappa free light chain: 29.7 mg/L — ABNORMAL HIGH (ref 3.3–19.4)
Kappa, lambda light chain ratio: 2.36 — ABNORMAL HIGH (ref 0.26–1.65)
Lambda free light chains: 12.6 mg/L (ref 5.7–26.3)

## 2021-07-11 LAB — PROTEIN ELECTROPHORESIS, SERUM
A/G Ratio: 1.4 (ref 0.7–1.7)
Albumin ELP: 3.7 g/dL (ref 2.9–4.4)
Alpha-1-Globulin: 0.2 g/dL (ref 0.0–0.4)
Alpha-2-Globulin: 0.5 g/dL (ref 0.4–1.0)
Beta Globulin: 1.1 g/dL (ref 0.7–1.3)
Gamma Globulin: 0.8 g/dL (ref 0.4–1.8)
Globulin, Total: 2.6 g/dL (ref 2.2–3.9)
Total Protein ELP: 6.3 g/dL (ref 6.0–8.5)

## 2021-07-12 LAB — IMMUNOFIXATION ELECTROPHORESIS
IgA: 201 mg/dL (ref 90–386)
IgG (Immunoglobin G), Serum: 687 mg/dL (ref 603–1613)
IgM (Immunoglobulin M), Srm: 38 mg/dL (ref 20–172)
Total Protein ELP: 6.3 g/dL (ref 6.0–8.5)

## 2021-07-16 ENCOUNTER — Other Ambulatory Visit: Payer: Self-pay

## 2021-07-16 ENCOUNTER — Inpatient Hospital Stay (HOSPITAL_BASED_OUTPATIENT_CLINIC_OR_DEPARTMENT_OTHER): Payer: Medicare Other | Admitting: Hematology

## 2021-07-16 VITALS — BP 163/82 | HR 68 | Temp 98.1°F | Resp 17 | Ht 66.0 in | Wt 167.6 lb

## 2021-07-16 DIAGNOSIS — C9001 Multiple myeloma in remission: Secondary | ICD-10-CM

## 2021-07-16 DIAGNOSIS — G8929 Other chronic pain: Secondary | ICD-10-CM

## 2021-07-16 DIAGNOSIS — D696 Thrombocytopenia, unspecified: Secondary | ICD-10-CM | POA: Diagnosis not present

## 2021-07-16 DIAGNOSIS — D61818 Other pancytopenia: Secondary | ICD-10-CM | POA: Diagnosis not present

## 2021-07-16 DIAGNOSIS — M546 Pain in thoracic spine: Secondary | ICD-10-CM | POA: Diagnosis not present

## 2021-07-16 DIAGNOSIS — C9002 Multiple myeloma in relapse: Secondary | ICD-10-CM | POA: Diagnosis not present

## 2021-07-16 NOTE — Progress Notes (Signed)
[place], [place] [address]   CLINIC:  Medical Oncology/Hematology  PCP:  Monico Blitz, MD [address] [place] [address] [phone]   REASON FOR VISIT:  Follow-up for multiple myeloma  PRIOR THERAPY:  1. 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 2. Revlimid maintenance until 2015.  CURRENT THERAPY: Revlimid 3/4 weeks; Decadron weekly  BRIEF ONCOLOGIC HISTORY:  Oncology History  Multiple myeloma in remission ([place])  06/05/2003 Initial Diagnosis   Myeloma, presenting with excruciating back pain and T12 vertebral body fracture, kappa light chain multiple myeloma diagnosed with 2030 mg light chains per 24 hours and urine   06/09/2003 - 06/22/2003 Radiation Therapy   19 Gray to T11-L1 or T12 vertebral body fracture. [name] T2-T4 for T3 vertebral body involvement   06/26/2003 - 07/25/2004 Chemotherapy   Doxil, Velcade, and dexamethasone    02/27/2004 Bone Marrow Biopsy   Normal cellular bone marrow showing trilineage hematopoiesis with less than 10% plasma cells.   07/25/2004 Bone Marrow Transplant   Transplant after high-dose chemotherapy with melphalan 200 mg/m, but no maintenance therapy was given, transplant at [facility].   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  Followed by [facility] Department of dentistry.   06/22/2008 Progression   Relapse with The light chains in 24-hour urine found, therapy reinitiated with Revlimid/dexamethasone.   07/31/2008 - 08/10/2013 Chemotherapy   Revlimid/dexamethasone initiated for recurrent disease.    08/27/2010 - 09/10/2010 Radiation Therapy   2500 cGy to right hip for impending right femoral neck fracture   08/04/2013 Imaging   Bone density-normal with T score of -1.0.   01/19/2014 Imaging   MRI pelvis-partially visualized right L4-L5 and L5-S1 facet arthritis with periarticular edema. This can be associated with referred pain to the right hip. Small abnormal marrow signal  foci in the proximal right femur are likely representing treated myeloma. There is no cortical erosion, stress reaction or stress fracture. Similar lesions are present in the right supra-acetabular ileum.   02/08/2014 Imaging   Bone survey-no significant change in widespread myelomatous involvement of skeleton. Multiple pathologic fractures in the thoracic spine at L1 appears stable. No interval pathologic fracture identified.   12/26/2015 Tumor Marker   M-spike NOT OBSERVED; normal IgG, IgM, IgA, & kappa/lambda light chains.     02/26/2016 Treatment Plan Change   Transfer of medical oncology care to [facility]   02/26/2016 Tumor Marker   M-spike NOT OBSERVED; normal IgG, IgM, IgA, & kappa/lambda light chains.    03/22/2016 Imaging   Skeletal survey imaging: IMPRESSION: Stable appearance of widespread myelomatous involvement of the skeleton. No progressive lesions are observed. Stable appearing partial compressions of thoracic vertebral bodies. Stable vertebra plana at T12.   05/29/2016 Tumor Marker   M-spike NOT OBSERVED; normal IgG, IgM, IgA, & kappa/lambda light chains.    12/02/2019 Genetic Testing   FISH analysis       CANCER STAGING:  Cancer Staging  No matching staging information was found for the patient.  INTERVAL HISTORY:  [name], a 61 y.o. male, returns for routine follow-up of his multiple myeloma. Epic was last seen on 04/08/2021.   Today he reports feeling good. He reports back pain with exertion which is helped by methadone BID. He is taking Revlimid 3 weeks on and 1 week off. He denies n/v/d/c and recent infections.    REVIEW OF SYSTEMS:  Review of Systems  Constitutional:  Negative for appetite change and fatigue.  Gastrointestinal:  Negative for constipation, diarrhea, nausea and vomiting.  Musculoskeletal:  Positive for back pain (with exertion).  All other systems reviewed and are negative.  PAST MEDICAL/SURGICAL HISTORY:   Past Medical History:  Diagnosis Date   Multiple myeloma in remission ([place]) 11/26/2005   Past Surgical History:  Procedure Laterality Date   BACK SURGERY     CYSTOSCOPY/URETEROSCOPY/HOLMIUM LASER/STENT PLACEMENT Left 10/09/2020   Procedure: CYSTOSCOPY/LEFT RETROGRADE/URETEROSCOPY/HOLMIUM LASER/LEFT STENT PLACEMENT;  Surgeon: Festus Aloe, MD;  Location: [facility];  Service: Urology;  Laterality: Left;  ONLY NEEDS 60 MIN   porth cath      SOCIAL HISTORY:  Social History   Socioeconomic History   Marital status: Married    Spouse name: Not on file   Number of children: Not on file   Years of education: Not on file   Highest education level: Not on file  Occupational History   Not on file  Tobacco Use   Smoking status: Never   Smokeless tobacco: Never  Vaping Use   Vaping Use: Never used  Substance and Sexual Activity   Alcohol use: No   Drug use: No   Sexual activity: Not on file  Other Topics Concern   Not on file  Social History Narrative   Not on file   Social Determinants of Health   Financial Resource Strain: Not on file  Food Insecurity: Not on file  Transportation Needs: Not on file  Physical Activity: Not on file  Stress: Not on file  Social Connections: Not on file  Intimate Partner Violence: Not on file    FAMILY HISTORY:  Family History  Problem Relation Age of Onset   Diabetes Mother    Cancer Sister     CURRENT MEDICATIONS:  Current Outpatient Medications  Medication Sig Dispense Refill   ASPIRIN 81 PO Take 81 mg by mouth daily.     CALCIUM PO Take 400 mg by mouth daily.     chlorhexidine (PERIDEX) 0.12 % solution USE AS DIRECTED 15 MLS IN THE MOUTH OR THROAT 2 (TWO) TIMES DAILY. (Patient taking differently: Use as directed 15 mLs in the mouth or throat daily.) 473 mL 2   dexamethasone (DECADRON) 4 MG tablet Take 4 tablets (16 mg total) by mouth once a week. Tuesday 80 tablet 4   folic acid (FOLVITE) 1 MG tablet Take 1 mg  by mouth daily.     lenalidomide (REVLIMID) 15 MG capsule TAKE 1 CAPSULE DAILY FOR 21 DAYS ON AND 7 DAYS OFF. 21 capsule 0   lidocaine-prilocaine (EMLA) cream Apply 1 application topically as needed [facility]).     methadone (DOLOPHINE) 10 MG tablet Take 1 tablet (10 mg total) by mouth every 12 (twelve) hours. 60 tablet 0   polyethylene glycol (MIRALAX / GLYCOLAX) packet Take 17 g by mouth daily as needed for mild constipation or moderate constipation.     potassium chloride SA (KLOR-CON M20) 20 MEQ tablet Take 1 tablet (20 mEq total) by mouth daily. 90 tablet 1   Pramox-PE-Glycerin-Petrolatum 1-0.25-14.4-15 % CREA Place 1 application rectally daily as needed (Irritation).     predniSONE (DELTASONE) 50 MG tablet Take 50 mg (1 tablet) by mouth 13 hours, 7 hours, and 1 hour prior to dye study procedure 3 tablet 0   No current facility-administered medications for this visit.   Facility-Administered Medications Ordered in Other Visits  Medication Dose Route Frequency Provider Last Rate Last Admin   sodium chloride  flush (NS) 0.9 % injection 10 mL  10 mL Intravenous PRN Baird Cancer, PA-C   10 mL at 06/19/17 1029    ALLERGIES:  Allergies  Allergen Reactions   Contrast Media [Iodinated Contrast Media]     Burning sensation all over body    PHYSICAL EXAM:  Performance status (ECOG): 1 - Symptomatic but completely ambulatory  Vitals:   07/16/21 1511  BP: (!) 163/82  Pulse: 68  Resp: 17  Temp: 98.1 F (36.7 C)  SpO2: 96%   Wt Readings from Last 3 Encounters:  07/16/21 167 lb 9.6 oz (76 kg)  01/02/21 168 lb (76.2 kg)  12/26/20 168 lb 6.4 oz (76.4 kg)   Physical Exam Vitals reviewed.  Constitutional:      Appearance: Normal appearance.  Cardiovascular:     Rate and Rhythm: Normal rate and regular rhythm.     Pulses: Normal pulses.     Heart sounds: Normal heart sounds.  Pulmonary:     Effort: Pulmonary effort is normal.     Breath sounds: Normal breath sounds.  Skin:     Findings: Lesion (L forearm 1 cm hyperpigmented crusted lesion) present.  Neurological:     General: No focal deficit present.     Mental Status: He is alert and oriented to person, place, and time.  Psychiatric:        Mood and Affect: Mood normal.        Behavior: Behavior normal.     LABORATORY DATA:  I have reviewed the labs as listed.  CBC Latest Ref Rng & Units 07/09/2021 04/01/2021 12/19/2020  WBC 4.0 - 10.5 K/uL 3.8(L) 2.9(L) 5.2  Hemoglobin 13.0 - 17.0 g/dL 12.6(L) 12.1(L) 11.7(L)  Hematocrit 39.0 - 52.0 % 40.8 40.1 38.1(L)  Platelets 150 - 400 K/uL 140(L) 122(L) 103(L)   CMP Latest Ref Rng & Units 07/09/2021 04/01/2021 12/19/2020  Glucose 70 - 99 mg/dL 112(H) 94 95  BUN 6 - 20 mg/dL _0 Creatinine 0.61 - 1.24 mg/dL 1.02 0.86 0.93  Sodium 135 - 145 mmol/L 139 139 143  Potassium 3.5 - 5.1 mmol/L 3.5 3.1(L) 3.9  Chloride 98 - 111 mmol/L 110 106 111  CO2 22 - 32 mmol/L _1 Calcium 8.9 - 10.3 mg/dL 8.3(L) 8.8(L) 9.3  Total Protein 6.5 - 8.1 g/dL 6.6 6.4(L) 6.5  Total Bilirubin 0.3 - 1.2 mg/dL 1.3(H) 1.5(H) 1.7(H)  Alkaline Phos 38 - 126 U/L 40 34(L) 34(L)  AST 15 - 41 U/L _2 ALT 0 - 44 U/L _3 DIAGNOSTIC IMAGING:  I have independently reviewed the scans and discussed with the patient. No results found.   ASSESSMENT:  1.  Relapsed kappa light chain multiple myeloma: -Diagnosis on 06/05/2003, presentation with T12 vertebral body fracture, kappa light chain myeloma diagnosed with 2.03 g of light chains/24-hour urine. -06/09/2003 -06/22/2003 radiation 19 Gray total T11-L1, 19 grade T2-T4 for T3 vertebral body involvement. -06/26/2003 chemotherapy with Doxil, Velcade and dexamethasone. 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 -06/22/2008 relapse of myeloma with kappa light chains and 24-hour urine found, treatment  initiated with Revlimid/dexamethasone from 07/31/2008 through 08/10/2013. -Radiation therapy 25 centigrade to right hip for impending right femoral neck fracture from 08/27/2010 through 09/10/2010. -PET scan on 09/12/2019 did not show any myeloma lesions. -SPEP and immunofixation on 09/12/2019 was normal.  Kappa light chains have increased to 48.  Ratio has gone up to 4.32 and steadily increasing. -Creatinine and calcium were normal.  LDH was normal. -24-hour urine shows no M spike.  Immunofixation was normal.  Elevated light chain ratio present. -Bone marrow biopsy on 12/02/2019 shows normocellular marrow with plasma cells ranging from 10-20%.  Chromosome analysis was normal.  FISH panel was negative for high-risk abnormalities. -Skeletal survey on 01/02/2020 shows possible new or better demonstrated lucencies in the T5 and T6 vertebral bodies. -Labs on 01/02/2020 shows kappa light chains 118.7, ratio of 11.2, uptrending.  SPEP is negative. -Revlimid 15 mg 3 weeks on/1 week off with weekly dexamethasone 20 mg started on 01/30/2020.   PLAN:  1.  Relapsed multiple myeloma: - He is tolerating Revlimid 3 weeks on/1 week off very well. - We reviewed myeloma labs from 07/09/2021.  M spike is negative.  Immunofixation was normal.  Free light chain ratio was 2.36, down from 3.37.  Kappa light chains at 29.7. - Creatinine is 1.02 and calcium 8.3.  Hemoglobin was 12.6 and has improved. - Continue Revlimid 15 mg 3 weeks on/1 week off.  Continue dexamethasone 16 mg weekly. - He has a 1 cm hyperpigmented crusted lesion on the left forearm.  He reports that it has been there for few years and has not changed.  No itching reported.  However he tends to scratch it at times.  I have recommended evaluation by dermatology if there is any bleeding, itching, increase in size or change in pigmentation. - Recommend follow-up in 3 months with repeat labs.   2.  Chronic mid back pain: - Pain is fairly well controlled on the  current regimen of methadone 10 mg every 12 hours.   3.  Mild thrombocytopenia: - Mild thrombocytopenia since 2017 is stable.  Platelet count is 140.   4.  Thromboprophylaxis: - Continue aspirin 81 mg daily.   Orders placed this encounter:  No orders of the defined types were placed in this encounter.    Derek Jack, MD [place] [phone]   I, Thana Ates, am acting as a scribe for Dr. Derek Jack.  I, Derek Jack MD, have reviewed the above documentation for accuracy and completeness, and I agree with the above.

## 2021-07-16 NOTE — Patient Instructions (Signed)
[place] at [facility] Discharge Instructions  You were seen and examined today by Dr. Delton Coombes. He reviewed your most recent labs and everything looks okay. Continue taking Revlimid and Steroid as prescribed. Take Calcium once daily. Please keep follow up appointment as scheduled in 3 months.   Thank you for choosing [place] at [facility] to provide your oncology and hematology care.  To afford each patient quality time with our provider, please arrive at least 15 minutes before your scheduled appointment time.   If you have a lab appointment with the [place] please come in thru the Main Entrance and check in at the main information desk.  You need to re-schedule your appointment should you arrive 10 or more minutes late.  We strive to give you quality time with our providers, and arriving late affects you and other patients whose appointments are after yours.  Also, if you no show three or more times for appointments you may be dismissed from the clinic at the providers discretion.     Again, thank you for choosing [facility].  Our hope is that these requests will decrease the amount of time that you wait before being seen by our physicians.       _____________________________________________________________  Should you have questions after your visit to [facility], please contact our office at [phone] and follow the prompts.  Our office hours are 8:00 a.m. and 4:30 p.m. Monday - Friday.  Please note that voicemails left after 4:00 p.m. may not be returned until the following business day.  We are closed weekends and major holidays.  You do have access to a nurse 24-7, just call the main number to the clinic [phone] and do not press any options, hold on the line and a nurse will answer the phone.    For prescription refill requests, have your pharmacy contact our office and allow 72 hours.    Due  to Covid, you will need to wear a mask upon entering the hospital. If you do not have a mask, a mask will be given to you at the Main Entrance upon arrival. For doctor visits, patients may have 1 support person age 77 or older with them. For treatment visits, patients can not have anyone with them due to social distancing guidelines and our immunocompromised population.

## 2021-07-24 ENCOUNTER — Other Ambulatory Visit (HOSPITAL_COMMUNITY): Payer: Self-pay

## 2021-07-29 ENCOUNTER — Other Ambulatory Visit (HOSPITAL_COMMUNITY): Payer: Self-pay

## 2021-07-29 DIAGNOSIS — C9001 Multiple myeloma in remission: Secondary | ICD-10-CM

## 2021-07-29 DIAGNOSIS — M546 Pain in thoracic spine: Secondary | ICD-10-CM

## 2021-07-29 DIAGNOSIS — G8929 Other chronic pain: Secondary | ICD-10-CM

## 2021-07-29 MED ORDER — METHADONE HCL 10 MG PO TABS: 10.0000 mg | ORAL_TABLET | Freq: Two times a day (BID) | ORAL | 0 refills | Status: DC

## 2021-07-31 ENCOUNTER — Other Ambulatory Visit (HOSPITAL_COMMUNITY): Payer: Self-pay

## 2021-07-31 DIAGNOSIS — C9002 Multiple myeloma in relapse: Secondary | ICD-10-CM

## 2021-07-31 MED ORDER — LENALIDOMIDE 15 MG PO CAPS: ORAL_CAPSULE | ORAL | 0 refills | Status: DC

## 2021-07-31 NOTE — Telephone Encounter (Signed)
Chart reviewed. Revlimid refilled per last office note with Dr. Katragadda.  

## 2021-08-08 ENCOUNTER — Other Ambulatory Visit (HOSPITAL_COMMUNITY): Payer: Self-pay | Admitting: Hematology

## 2021-08-08 ENCOUNTER — Other Ambulatory Visit (HOSPITAL_COMMUNITY): Payer: Self-pay

## 2021-08-08 DIAGNOSIS — C9002 Multiple myeloma in relapse: Secondary | ICD-10-CM

## 2021-08-26 ENCOUNTER — Other Ambulatory Visit (HOSPITAL_COMMUNITY): Payer: Self-pay | Admitting: *Deleted

## 2021-08-26 DIAGNOSIS — M546 Pain in thoracic spine: Secondary | ICD-10-CM

## 2021-08-26 DIAGNOSIS — C9001 Multiple myeloma in remission: Secondary | ICD-10-CM

## 2021-08-26 DIAGNOSIS — G8929 Other chronic pain: Secondary | ICD-10-CM

## 2021-08-26 MED ORDER — METHADONE HCL 10 MG PO TABS: 10.0000 mg | ORAL_TABLET | Freq: Two times a day (BID) | ORAL | 0 refills | Status: DC

## 2021-08-29 ENCOUNTER — Encounter (HOSPITAL_COMMUNITY): Payer: Self-pay

## 2021-08-29 NOTE — Progress Notes (Signed)
Chart reviewed. Revlimid refilled per last office note with Dr. Katragadda.  

## 2021-09-05 ENCOUNTER — Other Ambulatory Visit (HOSPITAL_COMMUNITY): Payer: Self-pay | Admitting: Hematology

## 2021-09-05 DIAGNOSIS — C9002 Multiple myeloma in relapse: Secondary | ICD-10-CM

## 2021-09-24 ENCOUNTER — Other Ambulatory Visit (HOSPITAL_COMMUNITY): Payer: Self-pay

## 2021-09-24 DIAGNOSIS — G8929 Other chronic pain: Secondary | ICD-10-CM

## 2021-09-24 DIAGNOSIS — C9001 Multiple myeloma in remission: Secondary | ICD-10-CM

## 2021-09-24 MED ORDER — METHADONE HCL 10 MG PO TABS: 10.0000 mg | ORAL_TABLET | Freq: Two times a day (BID) | ORAL | 0 refills | Status: DC

## 2021-09-25 ENCOUNTER — Other Ambulatory Visit (HOSPITAL_COMMUNITY): Payer: Self-pay | Admitting: Hematology

## 2021-09-25 DIAGNOSIS — C9002 Multiple myeloma in relapse: Secondary | ICD-10-CM

## 2021-09-27 MED ORDER — LENALIDOMIDE 15 MG PO CAPS: ORAL_CAPSULE | ORAL | 0 refills | Status: DC

## 2021-09-27 NOTE — Telephone Encounter (Signed)
Chart reviewed. Revlimid refilled per last office note with Dr. Katragadda.  

## 2021-10-10 ENCOUNTER — Inpatient Hospital Stay (HOSPITAL_COMMUNITY): Payer: Medicare Other | Attending: Hematology

## 2021-10-10 DIAGNOSIS — C9001 Multiple myeloma in remission: Secondary | ICD-10-CM

## 2021-10-10 DIAGNOSIS — C9000 Multiple myeloma not having achieved remission: Secondary | ICD-10-CM | POA: Insufficient documentation

## 2021-10-10 DIAGNOSIS — D61818 Other pancytopenia: Secondary | ICD-10-CM

## 2021-10-10 LAB — CBC WITH DIFFERENTIAL/PLATELET
Abs Immature Granulocytes: 0.01 10*3/uL (ref 0.00–0.07)
Basophils Absolute: 0 10*3/uL (ref 0.0–0.1)
Basophils Relative: 1 %
Eosinophils Absolute: 0 10*3/uL (ref 0.0–0.5)
Eosinophils Relative: 0 %
HCT: 40.9 % (ref 39.0–52.0)
Hemoglobin: 12 g/dL — ABNORMAL LOW (ref 13.0–17.0)
Immature Granulocytes: 0 %
Lymphocytes Relative: 47 %
Lymphs Abs: 1.9 10*3/uL (ref 0.7–4.0)
MCH: 24.2 pg — ABNORMAL LOW (ref 26.0–34.0)
MCHC: 29.3 g/dL — ABNORMAL LOW (ref 30.0–36.0)
MCV: 82.5 fL (ref 80.0–100.0)
Monocytes Absolute: 0.6 10*3/uL (ref 0.1–1.0)
Monocytes Relative: 15 %
Neutro Abs: 1.5 10*3/uL — ABNORMAL LOW (ref 1.7–7.7)
Neutrophils Relative %: 37 %
Platelets: 122 10*3/uL — ABNORMAL LOW (ref 150–400)
RBC: 4.96 MIL/uL (ref 4.22–5.81)
RDW: 18.6 % — ABNORMAL HIGH (ref 11.5–15.5)
WBC: 4 10*3/uL (ref 4.0–10.5)
nRBC: 0 % (ref 0.0–0.2)

## 2021-10-10 LAB — LACTATE DEHYDROGENASE: LDH: 122 U/L (ref 98–192)

## 2021-10-10 LAB — COMPREHENSIVE METABOLIC PANEL
ALT: 13 U/L (ref 0–44)
AST: 14 U/L — ABNORMAL LOW (ref 15–41)
Albumin: 4 g/dL (ref 3.5–5.0)
Alkaline Phosphatase: 37 U/L — ABNORMAL LOW (ref 38–126)
Anion gap: 6 (ref 5–15)
BUN: 21 mg/dL — ABNORMAL HIGH (ref 6–20)
CO2: 24 mmol/L (ref 22–32)
Calcium: 9 mg/dL (ref 8.9–10.3)
Chloride: 114 mmol/L — ABNORMAL HIGH (ref 98–111)
Creatinine, Ser: 0.99 mg/dL (ref 0.61–1.24)
GFR, Estimated: >60 mL/min (ref >60)
Glucose, Bld: 88 mg/dL (ref 70–99)
Potassium: 3.3 mmol/L — ABNORMAL LOW (ref 3.5–5.1)
Sodium: 144 mmol/L (ref 135–145)
Total Bilirubin: 1.3 mg/dL — ABNORMAL HIGH (ref 0.3–1.2)
Total Protein: 6.6 g/dL (ref 6.5–8.1)

## 2021-10-10 MED ORDER — OCTREOTIDE ACETATE 30 MG IM KIT
PACK | INTRAMUSCULAR | Status: AC
  Filled 2021-10-10: qty 1

## 2021-10-11 LAB — KAPPA/LAMBDA LIGHT CHAINS
Kappa free light chain: 20.5 mg/L — ABNORMAL HIGH (ref 3.3–19.4)
Kappa, lambda light chain ratio: 2.5 — ABNORMAL HIGH (ref 0.26–1.65)
Lambda free light chains: 8.2 mg/L (ref 5.7–26.3)

## 2021-10-14 LAB — PROTEIN ELECTROPHORESIS, SERUM
A/G Ratio: 1.7 (ref 0.7–1.7)
Albumin ELP: 3.6 g/dL (ref 2.9–4.4)
Alpha-1-Globulin: 0.2 g/dL (ref 0.0–0.4)
Alpha-2-Globulin: 0.4 g/dL (ref 0.4–1.0)
Beta Globulin: 0.9 g/dL (ref 0.7–1.3)
Gamma Globulin: 0.6 g/dL (ref 0.4–1.8)
Globulin, Total: 2.1 g/dL — ABNORMAL LOW (ref 2.2–3.9)
Total Protein ELP: 5.7 g/dL — ABNORMAL LOW (ref 6.0–8.5)

## 2021-10-14 LAB — IMMUNOFIXATION ELECTROPHORESIS
IgA: 193 mg/dL (ref 90–386)
IgG (Immunoglobin G), Serum: 708 mg/dL (ref 603–1613)
IgM (Immunoglobulin M), Srm: 25 mg/dL (ref 20–172)
Total Protein ELP: 6.1 g/dL (ref 6.0–8.5)

## 2021-10-17 ENCOUNTER — Inpatient Hospital Stay (HOSPITAL_COMMUNITY): Payer: Medicare Other | Attending: Hematology | Admitting: Hematology

## 2021-10-17 VITALS — BP 151/77 | HR 53 | Temp 97.3°F | Resp 18 | Ht 62.99 in | Wt 167.8 lb

## 2021-10-17 DIAGNOSIS — D61818 Other pancytopenia: Secondary | ICD-10-CM | POA: Diagnosis not present

## 2021-10-17 DIAGNOSIS — G8929 Other chronic pain: Secondary | ICD-10-CM | POA: Diagnosis not present

## 2021-10-17 DIAGNOSIS — M546 Pain in thoracic spine: Secondary | ICD-10-CM

## 2021-10-17 DIAGNOSIS — C9002 Multiple myeloma in relapse: Secondary | ICD-10-CM | POA: Diagnosis not present

## 2021-10-17 DIAGNOSIS — C9001 Multiple myeloma in remission: Secondary | ICD-10-CM | POA: Diagnosis not present

## 2021-10-17 DIAGNOSIS — M549 Dorsalgia, unspecified: Secondary | ICD-10-CM | POA: Insufficient documentation

## 2021-10-17 DIAGNOSIS — D696 Thrombocytopenia, unspecified: Secondary | ICD-10-CM | POA: Diagnosis not present

## 2021-10-17 DIAGNOSIS — Z79899 Other long term (current) drug therapy: Secondary | ICD-10-CM | POA: Insufficient documentation

## 2021-10-17 NOTE — Progress Notes (Signed)
[address] [place], [place] [address]   CLINIC:  Medical Oncology/Hematology  PCP:  Steven Blitz, MD [address] [place] [address] [phone]   REASON FOR VISIT:  Follow-up for multiple myeloma  PRIOR THERAPY:  1. 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 2. Revlimid maintenance until 2015.  CURRENT THERAPY: Revlimid 3/4 weeks; Decadron weekly  BRIEF ONCOLOGIC HISTORY:  Oncology History  Multiple myeloma in remission ([place])  06/05/2003 Initial Diagnosis   Myeloma, presenting with excruciating back pain and T12 vertebral body fracture, kappa light chain multiple myeloma diagnosed with 2030 mg light chains per 24 hours and urine    06/09/2003 - 06/22/2003 Radiation Therapy   19 Gray to T11-L1 or T12 vertebral body fracture. [place] T2-T4 for T3 vertebral body involvement    06/26/2003 - 07/25/2004 Chemotherapy   Doxil, Velcade, and dexamethasone     02/27/2004 Bone Marrow Biopsy   Normal cellular bone marrow showing trilineage hematopoiesis with less than 10% plasma cells.    07/25/2004 Bone Marrow Transplant   Transplant after high-dose chemotherapy with melphalan 200 mg/m, but no maintenance therapy was given, transplant at [facility].    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  Followed by [facility] Department of dentistry.    06/22/2008 Progression   Relapse with The light chains in 24-hour urine found, therapy reinitiated with Revlimid/dexamethasone.    07/31/2008 - 08/10/2013 Chemotherapy   Revlimid/dexamethasone initiated for recurrent disease.     08/27/2010 - 09/10/2010 Radiation Therapy   2500 cGy to right hip for impending right femoral neck fracture    08/04/2013 Imaging   Bone density-normal with T score of -1.0.    01/19/2014 Imaging   MRI pelvis-partially visualized right L4-L5 and L5-S1 facet arthritis with periarticular edema. This can be associated with referred pain to the right hip. Small  abnormal marrow signal foci in the proximal right femur are likely representing treated myeloma. There is no cortical erosion, stress reaction or stress fracture. Similar lesions are present in the right supra-acetabular ileum.    02/08/2014 Imaging   Bone survey-no significant change in widespread myelomatous involvement of skeleton. Multiple pathologic fractures in the thoracic spine at L1 appears stable. No interval pathologic fracture identified.    12/26/2015 Tumor Marker   M-spike NOT OBSERVED; normal IgG, IgM, IgA, & kappa/lambda light chains.      02/26/2016 Treatment Plan Change   Transfer of medical oncology care to [facility]    02/26/2016 Tumor Marker   M-spike NOT OBSERVED; normal IgG, IgM, IgA, & kappa/lambda light chains.    03/22/2016 Imaging   Skeletal survey imaging: IMPRESSION: Stable appearance of widespread myelomatous involvement of the skeleton. No progressive lesions are observed. Stable appearing partial compressions of thoracic vertebral bodies. Stable vertebra plana at T12.    05/29/2016 Tumor Marker   M-spike NOT OBSERVED; normal IgG, IgM, IgA, & kappa/lambda light chains.    12/02/2019 Genetic Testing   FISH analysis       CANCER STAGING:  Cancer Staging  No matching staging information was found for the patient.  INTERVAL HISTORY:  [name], a 61 y.o. male, returns for routine follow-up of his multiple myeloma. [name] was last seen on 07/16/2021.   Today he reports feeling good. He denies new pains. His back pain and numbness in his feet are stable. He denies numbness in fingertips. His appetite is good. He denies nausea and  vomiting. He reports he previously received Zometa but it was stopped after he developed necrosis of the jaw.   REVIEW OF SYSTEMS:  Review of Systems  Constitutional:  Negative for appetite change and fever.  Gastrointestinal:  Negative for nausea and vomiting.  Musculoskeletal:  Positive for  back pain (stable).  Neurological:  Positive for numbness (feet).  All other systems reviewed and are negative.  PAST MEDICAL/SURGICAL HISTORY:  Past Medical History:  Diagnosis Date   Multiple myeloma in remission ([place]) 11/26/2005   Past Surgical History:  Procedure Laterality Date   BACK SURGERY     CYSTOSCOPY/URETEROSCOPY/HOLMIUM LASER/STENT PLACEMENT Left 10/09/2020   Procedure: CYSTOSCOPY/LEFT RETROGRADE/URETEROSCOPY/HOLMIUM LASER/LEFT STENT PLACEMENT;  Surgeon: Festus Aloe, MD;  Location: [facility];  Service: Urology;  Laterality: Left;  ONLY NEEDS 60 MIN   porth cath      SOCIAL HISTORY:  Social History   Socioeconomic History   Marital status: Married    Spouse name: Not on file   Number of children: Not on file   Years of education: Not on file   Highest education level: Not on file  Occupational History   Not on file  Tobacco Use   Smoking status: Never   Smokeless tobacco: Never  Vaping Use   Vaping Use: Never used  Substance and Sexual Activity   Alcohol use: No   Drug use: No   Sexual activity: Not on file  Other Topics Concern   Not on file  Social History Narrative   Not on file   Social Determinants of Health   Financial Resource Strain: Not on file  Food Insecurity: Not on file  Transportation Needs: Not on file  Physical Activity: Not on file  Stress: Not on file  Social Connections: Not on file  Intimate Partner Violence: Not on file    FAMILY HISTORY:  Family History  Problem Relation Age of Onset   Diabetes Mother    Cancer Sister     CURRENT MEDICATIONS:  Current Outpatient Medications  Medication Sig Dispense Refill   ASPIRIN 81 PO Take 81 mg by mouth daily.     CALCIUM PO Take 400 mg by mouth daily.     chlorhexidine (PERIDEX) 0.12 % solution USE AS DIRECTED 15 MLS IN THE MOUTH OR THROAT 2 (TWO) TIMES DAILY. (Patient taking differently: Use as directed 15 mLs in the mouth or throat daily.) 473 mL 2    dexamethasone (DECADRON) 4 MG tablet Take 4 tablets (16 mg total) by mouth once a week. Tuesday 80 tablet 4   folic acid (FOLVITE) 1 MG tablet Take 1 mg by mouth daily.     lenalidomide (REVLIMID) 15 MG capsule TAKE 1 CAPSULE DAILY FOR 21 DAYS ON, AND 7 DAYS OFF 21 capsule 0   lidocaine-prilocaine (EMLA) cream Apply 1 application topically as needed [facility]).     methadone (DOLOPHINE) 10 MG tablet Take 1 tablet (10 mg total) by mouth every 12 (twelve) hours. 60 tablet 0   polyethylene glycol (MIRALAX / GLYCOLAX) packet Take 17 g by mouth daily as needed for mild constipation or moderate constipation.     potassium chloride SA (KLOR-CON M20) 20 MEQ tablet Take 1 tablet (20 mEq total) by mouth daily. 90 tablet 1   Pramox-PE-Glycerin-Petrolatum 1-0.25-14.4-15 % CREA Place 1 application rectally daily as needed (Irritation).     No current facility-administered medications for this visit.   Facility-Administered Medications Ordered in Other Visits  Medication Dose Route Frequency Provider Last Rate Last  Admin   octreotide (SANDOSTATIN LAR) 30 MG IM injection            sodium chloride flush (NS) 0.9 % injection 10 mL  10 mL Intravenous PRN Baird Cancer, PA-C   10 mL at 06/19/17 1029    ALLERGIES:  Allergies  Allergen Reactions   Contrast Media [Iodinated Contrast Media]     Burning sensation all over body    PHYSICAL EXAM:  Performance status (ECOG): 1 - Symptomatic but completely ambulatory  Vitals:   10/17/21 1524  BP: (!) 151/77  Pulse: (!) 53  Resp: 18  Temp: (!) 97.3 F (36.3 C)  SpO2: 100%   Wt Readings from Last 3 Encounters:  10/17/21 167 lb 12.3 oz (76.1 kg)  07/16/21 167 lb 9.6 oz (76 kg)  01/02/21 168 lb (76.2 kg)   Physical Exam Vitals reviewed.  Constitutional:      Appearance: Normal appearance.  Cardiovascular:     Rate and Rhythm: Normal rate and regular rhythm.     Pulses: Normal pulses.     Heart sounds: Normal heart sounds.  Pulmonary:     Effort:  Pulmonary effort is normal.     Breath sounds: Normal breath sounds.  Neurological:     General: No focal deficit present.     Mental Status: He is alert and oriented to person, place, and time.  Psychiatric:        Mood and Affect: Mood normal.        Behavior: Behavior normal.     LABORATORY DATA:  I have reviewed the labs as listed.     Latest Ref Rng & Units 10/10/2021    8:59 AM 07/09/2021    9:03 AM 04/01/2021    9:03 AM  CBC  WBC 4.0 - 10.5 K/uL 4.0   3.8   2.9    Hemoglobin 13.0 - 17.0 g/dL 12.0   12.6   12.1    Hematocrit 39.0 - 52.0 % 40.9   40.8   40.1    Platelets 150 - 400 K/uL 122   140   122        Latest Ref Rng & Units 10/10/2021    8:59 AM 07/09/2021    9:03 AM 04/01/2021    9:03 AM  CMP  Glucose 70 - 99 mg/dL 88   112   94    BUN 6 - 20 mg/dL 21   9   14     Creatinine 0.61 - 1.24 mg/dL 0.99   1.02   0.86    Sodium 135 - 145 mmol/L 144   139   139    Potassium 3.5 - 5.1 mmol/L 3.3   3.5   3.1    Chloride 98 - 111 mmol/L 114   110   106    CO2 22 - 32 mmol/L 24   23   28     Calcium 8.9 - 10.3 mg/dL 9.0   8.3   8.8    Total Protein 6.5 - 8.1 g/dL 6.6   6.6   6.4    Total Bilirubin 0.3 - 1.2 mg/dL 1.3   1.3   1.5    Alkaline Phos 38 - 126 U/L 37   40   34    AST 15 - 41 U/L 14   18   15     ALT 0 - 44 U/L 13   14   12       DIAGNOSTIC IMAGING:  I have  independently reviewed the scans and discussed with the patient. No results found.   ASSESSMENT:  1.  Relapsed kappa light chain multiple myeloma: -Diagnosis on 06/05/2003, presentation with T12 vertebral body fracture, kappa light chain myeloma diagnosed with 2.03 g of light chains/24-hour urine. -06/09/2003 -06/22/2003 radiation 19 Gray total T11-L1, 19 grade T2-T4 for T3 vertebral body involvement. -06/26/2003 chemotherapy with Doxil, Velcade and dexamethasone. 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 -06/22/2008 relapse of myeloma with kappa light chains and 24-hour urine found, treatment initiated with Revlimid/dexamethasone from 07/31/2008 through 08/10/2013. -Radiation therapy 25 centigrade to right hip for impending right femoral neck fracture from 08/27/2010 through 09/10/2010. -PET scan on 09/12/2019 did not show any myeloma lesions. -SPEP and immunofixation on 09/12/2019 was normal.  Kappa light chains have increased to 48.  Ratio has gone up to 4.32 and steadily increasing. -Creatinine and calcium were normal.  LDH was normal. -24-hour urine shows no M spike.  Immunofixation was normal.  Elevated light chain ratio present. -Bone marrow biopsy on 12/02/2019 shows normocellular marrow with plasma cells ranging from 10-20%.  Chromosome analysis was normal.  FISH panel was negative for high-risk abnormalities. -Skeletal survey on 01/02/2020 shows possible new or better demonstrated lucencies in the T5 and T6 vertebral bodies. -Labs on 01/02/2020 shows kappa light chains 118.7, ratio of 11.2, uptrending.  SPEP is negative. -Revlimid 15 mg 3 weeks on/1 week off with weekly dexamethasone 20 mg started on 01/30/2020.   PLAN:  1.  Relapsed multiple myeloma: - He is taking Revlimid 3 weeks on/1 week off very well. - Reviewed myeloma panel from 10/10/2021 which showed M spike was negative.  Creatinine was 0.99 and calcium 9.  Hemoglobin was 12.  Free light chain ratio is 2.5 with kappa light chains 20.5.  Immunofixation is unremarkable. - He has numbness in the feet all the time but without pain. - Continue Revlimid 15 mg 3 weeks on/1 week off. - Continue dexamethasone 16 mg weekly. - RTC 3 months with labs 1 week prior.   2.  Chronic mid back pain: - Continue methadone 10 mg every 12 hours.  Pain is well controlled.   3.  Mild thrombocytopenia: - Mild thrombocytopenia since 2017 is stable.  Platelet count is 122.   4.  Thromboprophylaxis: - Continue  aspirin 81 mg daily.  5.  Myeloma bone disease: - He had received Zometa in the past at [facility].  He developed ONJ and was held indefinitely.   Orders placed this encounter:  No orders of the defined types were placed in this encounter.    Derek Jack, MD [place] [phone]   I, Thana Ates, am acting as a scribe for Dr. Derek Jack.  I, Derek Jack MD, have reviewed the above documentation for accuracy and completeness, and I agree with the above.

## 2021-10-17 NOTE — Patient Instructions (Addendum)
[place] at [facility] ?Discharge Instructions ? ?You were seen and examined today by Dr. Delton Coombes. ? ?Dr. Delton Coombes discussed your most recent lab work and everything looks good. Bilirubin is slightly elevated and slight anemia more than likely coming from treatment. ? ?Continue taking Revlimid and Dexamethasone as prescribed.  ? ?Follow-up as scheduled in 3 months. ? ? ? ?Thank you for choosing [place] at [facility] to provide your oncology and hematology care.  To afford each patient quality time with our provider, please arrive at least 15 minutes before your scheduled appointment time.  ? ?If you have a lab appointment with the [place] please come in thru the Main Entrance and check in at the main information desk. ? ?You need to re-schedule your appointment should you arrive 10 or more minutes late.  We strive to give you quality time with our providers, and arriving late affects you and other patients whose appointments are after yours.  Also, if you no show three or more times for appointments you may be dismissed from the clinic at the providers discretion.     ?Again, thank you for choosing [facility].  Our hope is that these requests will decrease the amount of time that you wait before being seen by our physicians.       ?_____________________________________________________________ ? ?Should you have questions after your visit to [facility], please contact our office at [phone] and follow the prompts.  Our office hours are 8:00 a.m. and 4:30 p.m. Monday - Friday.  Please note that voicemails left after 4:00 p.m. may not be returned until the following business day.  We are closed weekends and major holidays.  You do have access to a nurse 24-7, just call the main number to the clinic [phone] and do not press any options, hold on the line and a nurse will answer the phone.   ? ?For prescription refill  requests, have your pharmacy contact our office and allow 72 hours.   ? ?Due to Covid, you will need to wear a mask upon entering the hospital. If you do not have a mask, a mask will be given to you at the Main Entrance upon arrival. For doctor visits, patients may have 1 support person age 46 or older with them. For treatment visits, patients can not have anyone with them due to social distancing guidelines and our immunocompromised population.  ? ?  ?

## 2021-10-24 ENCOUNTER — Other Ambulatory Visit (HOSPITAL_COMMUNITY): Payer: Self-pay

## 2021-10-24 DIAGNOSIS — C9002 Multiple myeloma in relapse: Secondary | ICD-10-CM

## 2021-10-24 MED ORDER — LENALIDOMIDE 15 MG PO CAPS: ORAL_CAPSULE | ORAL | 0 refills | Status: DC

## 2021-10-24 NOTE — Telephone Encounter (Signed)
Chart reviewed. Revlimid refilled per last office note with Dr. Katragadda.  

## 2021-10-28 ENCOUNTER — Other Ambulatory Visit (HOSPITAL_COMMUNITY): Payer: Self-pay | Admitting: *Deleted

## 2021-10-28 DIAGNOSIS — C9001 Multiple myeloma in remission: Secondary | ICD-10-CM

## 2021-10-28 DIAGNOSIS — G8929 Other chronic pain: Secondary | ICD-10-CM

## 2021-10-28 MED ORDER — POTASSIUM CHLORIDE CRYS ER 20 MEQ PO TBCR: 20.0000 meq | EXTENDED_RELEASE_TABLET | Freq: Every day | ORAL | 1 refills | Status: DC

## 2021-10-28 MED ORDER — METHADONE HCL 10 MG PO TABS: 10.0000 mg | ORAL_TABLET | Freq: Two times a day (BID) | ORAL | 0 refills | Status: DC

## 2021-11-18 ENCOUNTER — Other Ambulatory Visit (HOSPITAL_COMMUNITY): Payer: Self-pay | Admitting: Hematology

## 2021-11-18 DIAGNOSIS — C9002 Multiple myeloma in relapse: Secondary | ICD-10-CM

## 2021-11-18 NOTE — Telephone Encounter (Signed)
Chart reviewed. Revlimid refilled per last office note with Dr. Katragadda.  

## 2021-11-26 ENCOUNTER — Other Ambulatory Visit (HOSPITAL_COMMUNITY): Payer: Self-pay | Admitting: *Deleted

## 2021-11-26 DIAGNOSIS — C9001 Multiple myeloma in remission: Secondary | ICD-10-CM

## 2021-11-26 DIAGNOSIS — G8929 Other chronic pain: Secondary | ICD-10-CM

## 2021-11-26 MED ORDER — METHADONE HCL 10 MG PO TABS: 10.0000 mg | ORAL_TABLET | Freq: Two times a day (BID) | ORAL | 0 refills | Status: DC

## 2021-12-23 ENCOUNTER — Other Ambulatory Visit (HOSPITAL_COMMUNITY): Payer: Self-pay

## 2021-12-23 DIAGNOSIS — C9002 Multiple myeloma in relapse: Secondary | ICD-10-CM

## 2021-12-23 MED ORDER — LENALIDOMIDE 15 MG PO CAPS: ORAL_CAPSULE | ORAL | 0 refills | Status: DC

## 2021-12-23 NOTE — Telephone Encounter (Signed)
Chart reviewed. Revlimid refilled per last office note with Dr. Katragadda.  

## 2021-12-25 ENCOUNTER — Other Ambulatory Visit (HOSPITAL_COMMUNITY): Payer: Self-pay | Admitting: *Deleted

## 2021-12-25 DIAGNOSIS — G8929 Other chronic pain: Secondary | ICD-10-CM

## 2021-12-25 DIAGNOSIS — C9001 Multiple myeloma in remission: Secondary | ICD-10-CM

## 2021-12-25 MED ORDER — METHADONE HCL 10 MG PO TABS: 10.0000 mg | ORAL_TABLET | Freq: Two times a day (BID) | ORAL | 0 refills | Status: DC

## 2022-01-13 IMAGING — US US RENAL
1 series · 14 of 25 positions shown · non-contrast
Comparison: Renal ultrasound dated 10/31/2020.

CLINICAL DATA: 59-year-old male with ureteral calculus.

EXAM:
RENAL / URINARY TRACT ULTRASOUND COMPLETE

[Series 1: us renal · 14 of 43 slices shown]
[im 1/43]
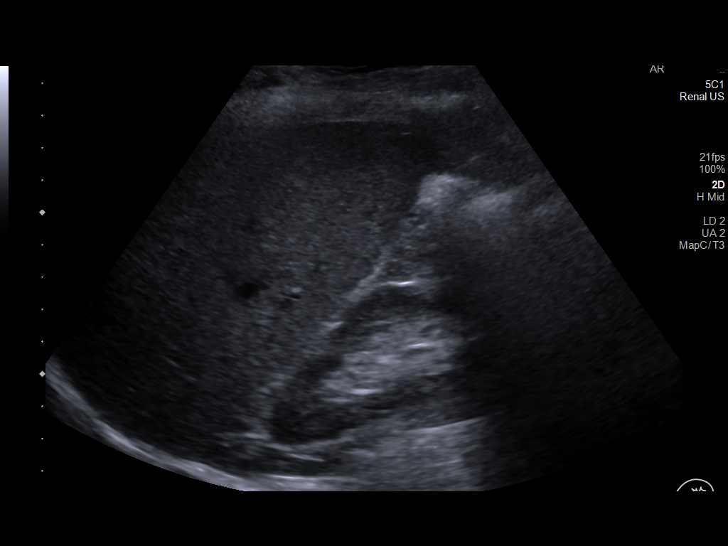
[im 4/43]
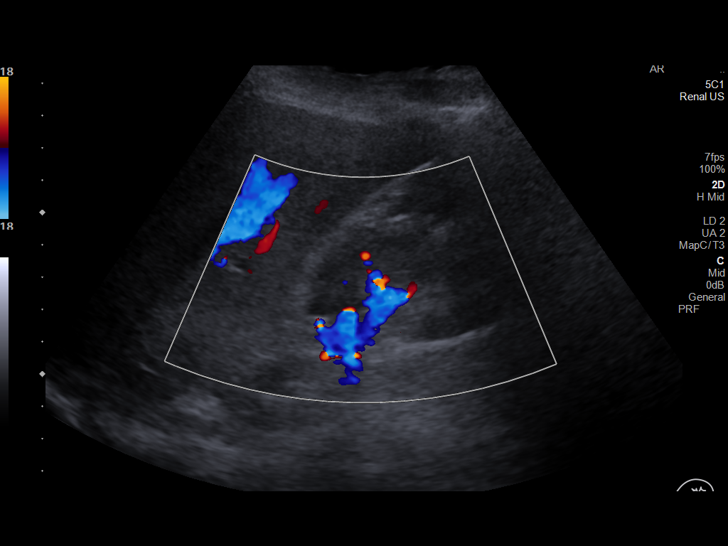
[im 8/43]
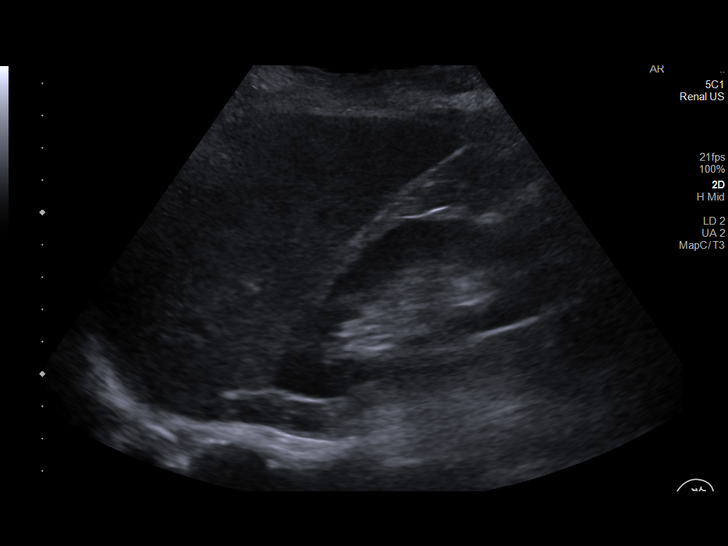
[im 11/43]
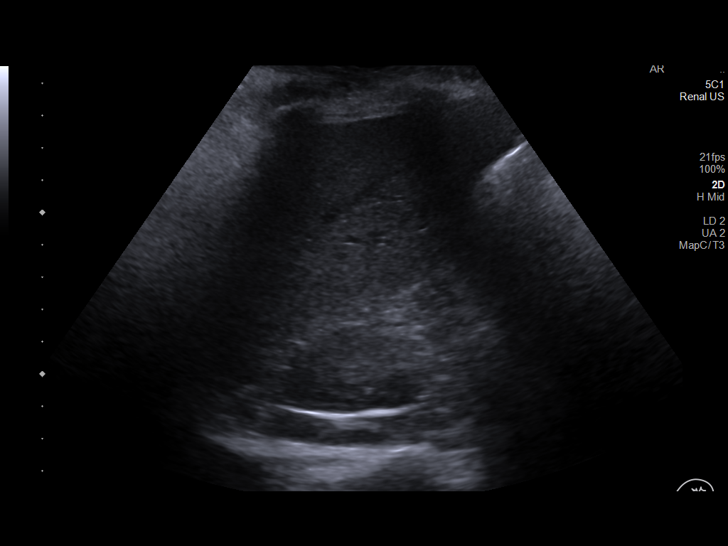
[im 15/43]
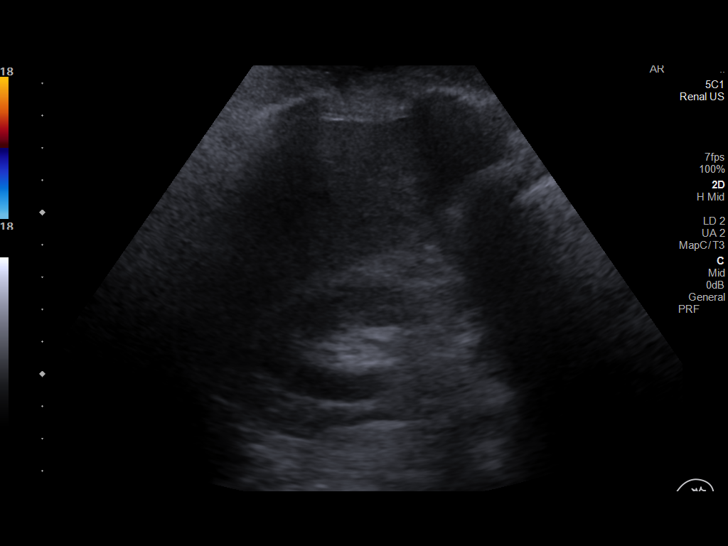
[im 16/43]
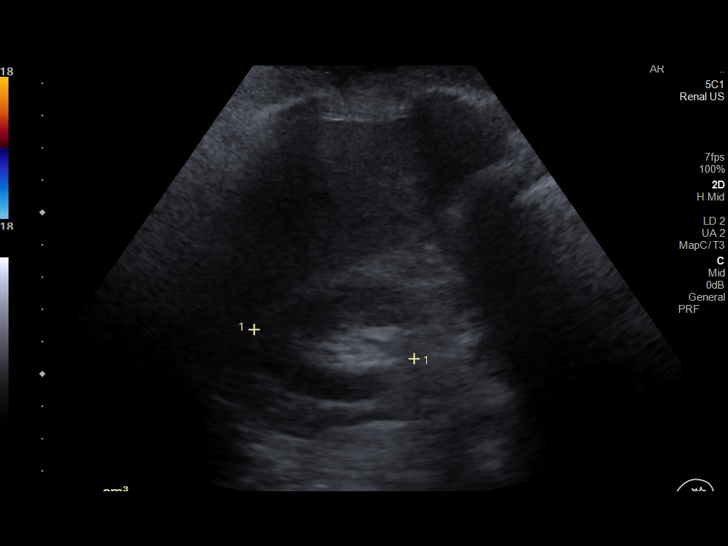
[im 20/43]
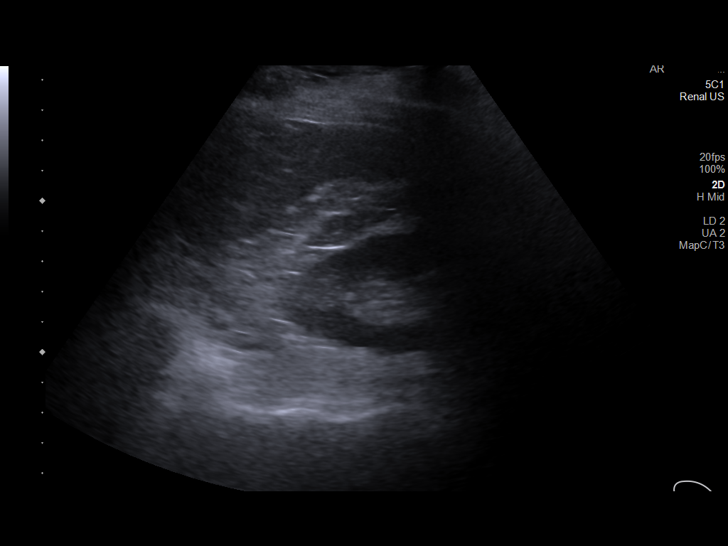
[im 23/43]
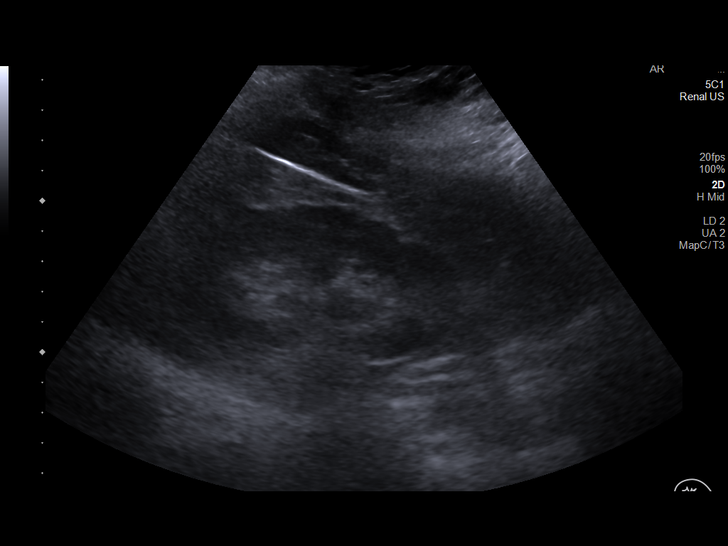
[im 27/43]
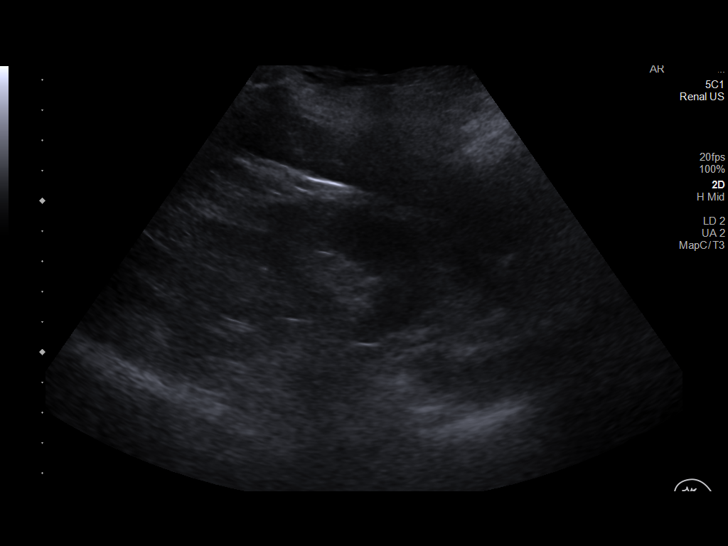
[im 29/43]
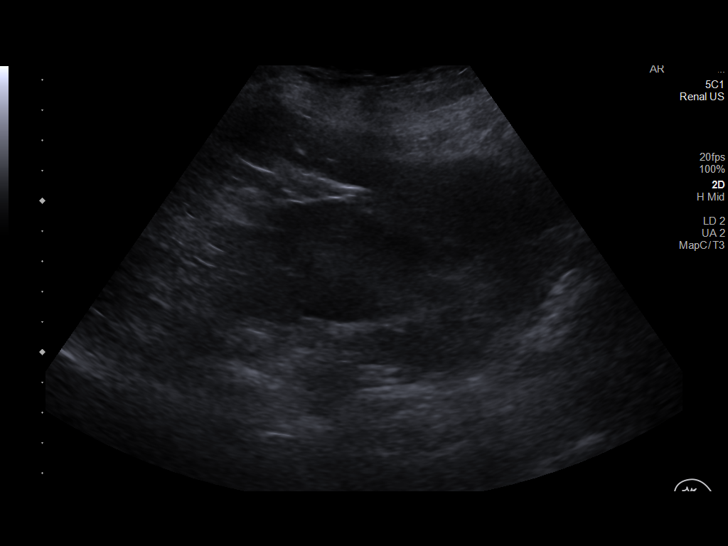
[im 32/43]
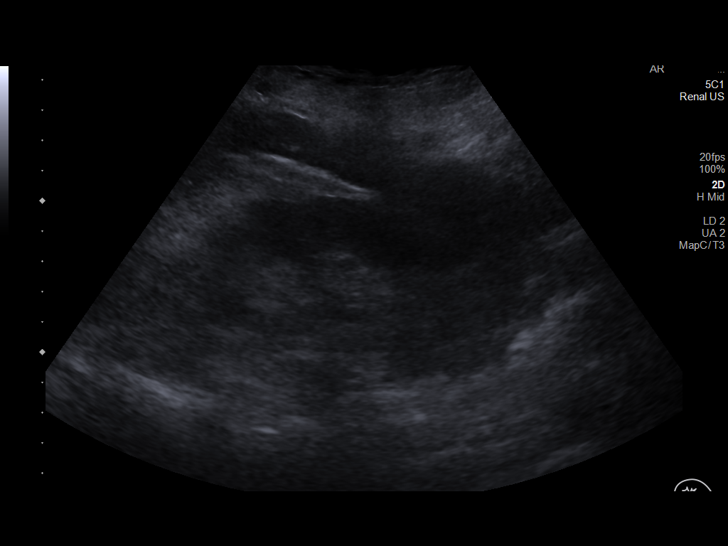
[im 36/43]
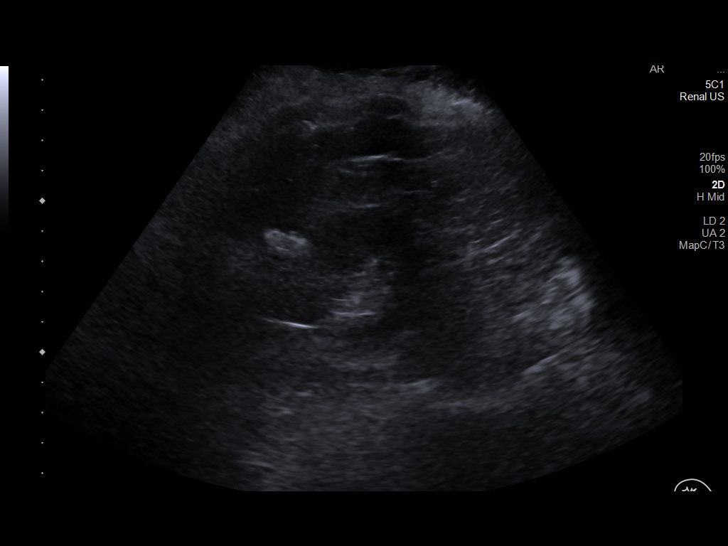
[im 39/43]
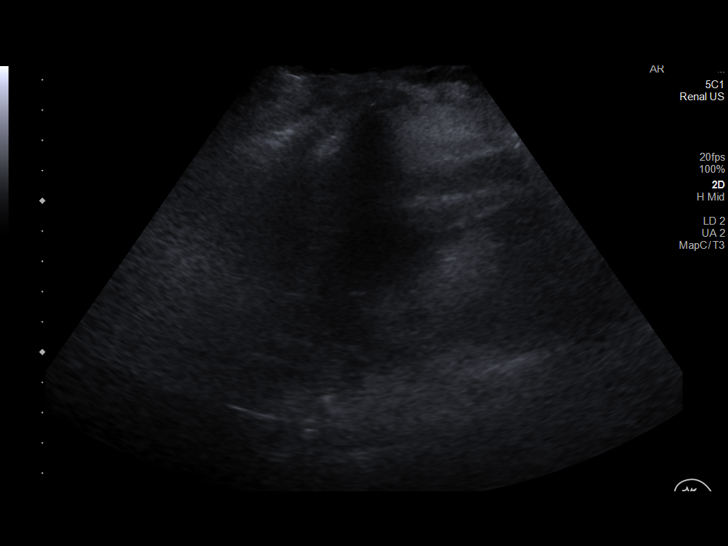
[im 43/43]
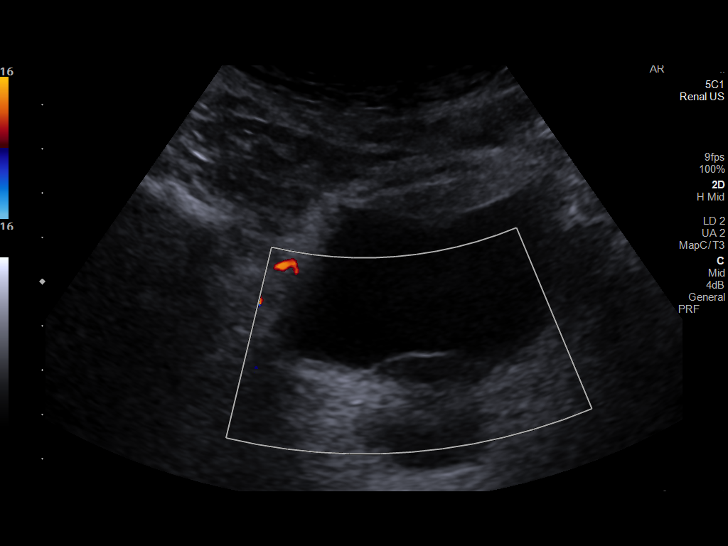

[14 of 25 positions shown; findings below may reference images not displayed]

FINDINGS: Right Kidney:

Renal measurements: 10.6 x 4.5 x 5.0 cm = volume: 127 mL.
Echogenicity within normal limits. No mass or hydronephrosis
visualized.

Left Kidney:

Renal measurements: 10.0 x 4.8 x 4.8 cm = volume: 122 mL.
Echogenicity within normal limits. No mass or hydronephrosis
visualized.

Bladder:

Appears normal for degree of bladder distention.

Other:

None.
IMPRESSION: Unremarkable renal ultrasound.

## 2022-01-15 ENCOUNTER — Inpatient Hospital Stay: Payer: Medicare Other | Attending: Hematology

## 2022-01-15 DIAGNOSIS — D696 Thrombocytopenia, unspecified: Secondary | ICD-10-CM | POA: Insufficient documentation

## 2022-01-15 DIAGNOSIS — C9001 Multiple myeloma in remission: Secondary | ICD-10-CM | POA: Insufficient documentation

## 2022-01-15 DIAGNOSIS — D61818 Other pancytopenia: Secondary | ICD-10-CM

## 2022-01-15 DIAGNOSIS — Z7982 Long term (current) use of aspirin: Secondary | ICD-10-CM | POA: Insufficient documentation

## 2022-01-15 DIAGNOSIS — M549 Dorsalgia, unspecified: Secondary | ICD-10-CM | POA: Insufficient documentation

## 2022-01-15 DIAGNOSIS — G8929 Other chronic pain: Secondary | ICD-10-CM

## 2022-01-15 LAB — COMPREHENSIVE METABOLIC PANEL
ALT: 16 U/L (ref 0–44)
AST: 17 U/L (ref 15–41)
Albumin: 3.9 g/dL (ref 3.5–5.0)
Alkaline Phosphatase: 33 U/L — ABNORMAL LOW (ref 38–126)
Anion gap: 7 (ref 5–15)
BUN: 17 mg/dL (ref 6–20)
CO2: 26 mmol/L (ref 22–32)
Calcium: 8.8 mg/dL — ABNORMAL LOW (ref 8.9–10.3)
Chloride: 109 mmol/L (ref 98–111)
Creatinine, Ser: 0.86 mg/dL (ref 0.61–1.24)
GFR, Estimated: >60 mL/min (ref >60)
Glucose, Bld: 113 mg/dL — ABNORMAL HIGH (ref 70–99)
Potassium: 3.8 mmol/L (ref 3.5–5.1)
Sodium: 142 mmol/L (ref 135–145)
Total Bilirubin: 1.6 mg/dL — ABNORMAL HIGH (ref 0.3–1.2)
Total Protein: 6.5 g/dL (ref 6.5–8.1)

## 2022-01-15 LAB — CBC WITH DIFFERENTIAL/PLATELET
Abs Immature Granulocytes: 0.02 10*3/uL (ref 0.00–0.07)
Basophils Absolute: 0 10*3/uL (ref 0.0–0.1)
Basophils Relative: 0 %
Eosinophils Absolute: 0 10*3/uL (ref 0.0–0.5)
Eosinophils Relative: 0 %
HCT: 39 % (ref 39.0–52.0)
Hemoglobin: 11.9 g/dL — ABNORMAL LOW (ref 13.0–17.0)
Immature Granulocytes: 1 %
Lymphocytes Relative: 16 %
Lymphs Abs: 0.7 10*3/uL (ref 0.7–4.0)
MCH: 24.4 pg — ABNORMAL LOW (ref 26.0–34.0)
MCHC: 30.5 g/dL (ref 30.0–36.0)
MCV: 80.1 fL (ref 80.0–100.0)
Monocytes Absolute: 0.4 10*3/uL (ref 0.1–1.0)
Monocytes Relative: 10 %
Neutro Abs: 2.9 10*3/uL (ref 1.7–7.7)
Neutrophils Relative %: 73 %
Platelets: 103 10*3/uL — ABNORMAL LOW (ref 150–400)
RBC: 4.87 MIL/uL (ref 4.22–5.81)
RDW: 18.2 % — ABNORMAL HIGH (ref 11.5–15.5)
WBC: 4.1 10*3/uL (ref 4.0–10.5)
nRBC: 0 % (ref 0.0–0.2)

## 2022-01-16 LAB — KAPPA/LAMBDA LIGHT CHAINS
Kappa free light chain: 17.6 mg/L (ref 3.3–19.4)
Kappa, lambda light chain ratio: 2.38 — ABNORMAL HIGH (ref 0.26–1.65)
Lambda free light chains: 7.4 mg/L (ref 5.7–26.3)

## 2022-01-17 LAB — PROTEIN ELECTROPHORESIS, SERUM
A/G Ratio: 1.5 (ref 0.7–1.7)
Albumin ELP: 3.8 g/dL (ref 2.9–4.4)
Alpha-1-Globulin: 0.2 g/dL (ref 0.0–0.4)
Alpha-2-Globulin: 0.5 g/dL (ref 0.4–1.0)
Beta Globulin: 1.1 g/dL (ref 0.7–1.3)
Gamma Globulin: 0.7 g/dL (ref 0.4–1.8)
Globulin, Total: 2.5 g/dL (ref 2.2–3.9)
Total Protein ELP: 6.3 g/dL (ref 6.0–8.5)

## 2022-01-20 LAB — IMMUNOFIXATION ELECTROPHORESIS
IgA: 188 mg/dL (ref 90–386)
IgG (Immunoglobin G), Serum: 748 mg/dL (ref 603–1613)
IgM (Immunoglobulin M), Srm: 26 mg/dL (ref 20–172)
Total Protein ELP: 6.2 g/dL (ref 6.0–8.5)

## 2022-01-22 ENCOUNTER — Encounter: Payer: Self-pay | Admitting: Hematology

## 2022-01-22 ENCOUNTER — Other Ambulatory Visit: Payer: Self-pay | Admitting: *Deleted

## 2022-01-22 ENCOUNTER — Inpatient Hospital Stay (HOSPITAL_BASED_OUTPATIENT_CLINIC_OR_DEPARTMENT_OTHER): Payer: Medicare Other | Admitting: Hematology

## 2022-01-22 ENCOUNTER — Other Ambulatory Visit: Payer: Self-pay

## 2022-01-22 VITALS — BP 144/77 | HR 57 | Temp 97.8°F | Resp 16 | Wt 166.0 lb

## 2022-01-22 DIAGNOSIS — C9001 Multiple myeloma in remission: Secondary | ICD-10-CM

## 2022-01-22 DIAGNOSIS — C9002 Multiple myeloma in relapse: Secondary | ICD-10-CM

## 2022-01-22 MED ORDER — POTASSIUM CHLORIDE CRYS ER 20 MEQ PO TBCR: 20.0000 meq | EXTENDED_RELEASE_TABLET | Freq: Every day | ORAL | 1 refills | Status: DC

## 2022-01-22 MED ORDER — LENALIDOMIDE 15 MG PO CAPS: ORAL_CAPSULE | ORAL | 0 refills | Status: DC

## 2022-01-22 NOTE — Progress Notes (Signed)
[place], [place] [address]   CLINIC:  Medical Oncology/Hematology  PCP:  Monico Blitz, MD [address] [place] [address] [phone]   REASON FOR VISIT:  Follow-up for multiple myeloma  PRIOR THERAPY:  1. 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 2. Revlimid maintenance until 2015.  CURRENT THERAPY: Revlimid 3/4 weeks; Decadron weekly  BRIEF ONCOLOGIC HISTORY:  Oncology History  Multiple myeloma in remission ([place])  06/05/2003 Initial Diagnosis   Myeloma, presenting with excruciating back pain and T12 vertebral body fracture, kappa light chain multiple myeloma diagnosed with 2030 mg light chains per 24 hours and urine   06/09/2003 - 06/22/2003 Radiation Therapy   19 Gray to T11-L1 or T12 vertebral body fracture. [place] T2-T4 for T3 vertebral body involvement   06/26/2003 - 07/25/2004 Chemotherapy   Doxil, Velcade, and dexamethasone    02/27/2004 Bone Marrow Biopsy   Normal cellular bone marrow showing trilineage hematopoiesis with less than 10% plasma cells.   07/25/2004 Bone Marrow Transplant   Transplant after high-dose chemotherapy with melphalan 200 mg/m, but no maintenance therapy was given, transplant at [facility].   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  Followed by [facility] Department of dentistry.   06/22/2008 Progression   Relapse with The light chains in 24-hour urine found, therapy reinitiated with Revlimid/dexamethasone.   07/31/2008 - 08/10/2013 Chemotherapy   Revlimid/dexamethasone initiated for recurrent disease.    08/27/2010 - 09/10/2010 Radiation Therapy   2500 cGy to right hip for impending right femoral neck fracture   08/04/2013 Imaging   Bone density-normal with T score of -1.0.   01/19/2014 Imaging   MRI pelvis-partially visualized right L4-L5 and L5-S1 facet arthritis with periarticular edema. This can be associated with referred pain to the right hip. Small abnormal marrow signal  foci in the proximal right femur are likely representing treated myeloma. There is no cortical erosion, stress reaction or stress fracture. Similar lesions are present in the right supra-acetabular ileum.   02/08/2014 Imaging   Bone survey-no significant change in widespread myelomatous involvement of skeleton. Multiple pathologic fractures in the thoracic spine at L1 appears stable. No interval pathologic fracture identified.   12/26/2015 Tumor Marker   M-spike NOT OBSERVED; normal IgG, IgM, IgA, & kappa/lambda light chains.     02/26/2016 Treatment Plan Change   Transfer of medical oncology care to [facility]   02/26/2016 Tumor Marker   M-spike NOT OBSERVED; normal IgG, IgM, IgA, & kappa/lambda light chains.    03/22/2016 Imaging   Skeletal survey imaging: IMPRESSION: Stable appearance of widespread myelomatous involvement of the skeleton. No progressive lesions are observed. Stable appearing partial compressions of thoracic vertebral bodies. Stable vertebra plana at T12.   05/29/2016 Tumor Marker   M-spike NOT OBSERVED; normal IgG, IgM, IgA, & kappa/lambda light chains.    12/02/2019 Genetic Testing   FISH analysis       CANCER STAGING:  Cancer Staging  No matching staging information was found for the patient.  INTERVAL HISTORY:  Mr. DARVELL MONTEFORTE, a 61 y.o. male, seen for follow-up of multiple myeloma.  He is tolerating Revlimid and dexamethasone very well.  Denies any new onset pains.  Denies any infections.  No ER visits or hospitalizations.  Back pain is well-controlled with methadone.  REVIEW OF SYSTEMS:  Review of Systems  Constitutional:  Negative for appetite change and fever.  Gastrointestinal:  Negative for nausea and vomiting.  Musculoskeletal:  Positive for back pain (stable).  Neurological:  Positive for numbness (feet).  All other systems reviewed and are negative.   PAST MEDICAL/SURGICAL HISTORY:  Past Medical History:  Diagnosis Date    Multiple myeloma in remission ([place]) 11/26/2005   Past Surgical History:  Procedure Laterality Date   BACK SURGERY     CYSTOSCOPY/URETEROSCOPY/HOLMIUM LASER/STENT PLACEMENT Left 10/09/2020   Procedure: CYSTOSCOPY/LEFT RETROGRADE/URETEROSCOPY/HOLMIUM LASER/LEFT STENT PLACEMENT;  Surgeon: Festus Aloe, MD;  Location: [facility];  Service: Urology;  Laterality: Left;  ONLY NEEDS 60 MIN   porth cath      SOCIAL HISTORY:  Social History   Socioeconomic History   Marital status: Married    Spouse name: Not on file   Number of children: Not on file   Years of education: Not on file   Highest education level: Not on file  Occupational History   Not on file  Tobacco Use   Smoking status: Never   Smokeless tobacco: Never  Vaping Use   Vaping Use: Never used  Substance and Sexual Activity   Alcohol use: No   Drug use: No   Sexual activity: Not on file  Other Topics Concern   Not on file  Social History Narrative   Not on file   Social Determinants of Health   Financial Resource Strain: Low Risk  (05/14/2020)   Overall Financial Resource Strain (CARDIA)    Difficulty of Paying Living Expenses: Not hard at all  Food Insecurity: No Food Insecurity (05/14/2020)   Hunger Vital Sign    Worried About Running Out of Food in the Last Year: Never true    [place] in the Last Year: Never true  Transportation Needs: No Transportation Needs (05/14/2020)   PRAPARE - Hydrologist (Medical): No    Lack of Transportation (Non-Medical): No  Physical Activity: Inactive (05/14/2020)   Exercise Vital Sign    Days of Exercise per Week: 0 days    Minutes of Exercise per Session: 0 min  Stress: No Stress Concern Present (05/14/2020)   [place]    Feeling of Stress : Not at all  Social Connections: Moderately Integrated (05/14/2020)   Social Connection and Isolation Panel  [NHANES]    Frequency of Communication with Friends and Family: More than three times a week    Frequency of Social Gatherings with Friends and Family: Twice a week    Attends Religious Services: 1 to 4 times per year    Active Member of [employer] or Organizations: No    Attends Archivist Meetings: Never    Marital Status: Married  Human resources officer Violence: Not At Risk (05/14/2020)   Humiliation, Afraid, Rape, and Kick questionnaire    Fear of Current or Ex-Partner: No    Emotionally Abused: No    Physically Abused: No    Sexually Abused: No    FAMILY HISTORY:  Family History  Problem Relation Age of Onset   Diabetes Mother    Cancer Sister     CURRENT MEDICATIONS:  Current Outpatient Medications  Medication Sig Dispense Refill   ASPIRIN 81 PO Take 81 mg by mouth daily.     CALCIUM PO Take 400 mg by mouth daily.     chlorhexidine (PERIDEX) 0.12 % solution USE AS DIRECTED 15 MLS IN THE MOUTH OR THROAT 2 (TWO) TIMES DAILY. (Patient taking differently: Use as directed 15 mLs in the mouth  or throat daily.) 473 mL 2   dexamethasone (DECADRON) 4 MG tablet Take 4 tablets (16 mg total) by mouth once a week. Tuesday 80 tablet 4   folic acid (FOLVITE) 1 MG tablet Take 1 mg by mouth daily.     lenalidomide (REVLIMID) 15 MG capsule TAKE 1 CAPSULE DAILY FOR 21 DAYS ON, AND 7 DAYS OFF 21 capsule 0   lidocaine-prilocaine (EMLA) cream Apply 1 application topically as needed [facility]).     methadone (DOLOPHINE) 10 MG tablet Take 1 tablet (10 mg total) by mouth every 12 (twelve) hours. 60 tablet 0   polyethylene glycol (MIRALAX / GLYCOLAX) packet Take 17 g by mouth daily as needed for mild constipation or moderate constipation.     Pramox-PE-Glycerin-Petrolatum 1-0.25-14.4-15 % CREA Place 1 application rectally daily as needed (Irritation).     potassium chloride SA (KLOR-CON M20) 20 MEQ tablet Take 1 tablet (20 mEq total) by mouth daily. 90 tablet 1   No current facility-administered  medications for this visit.   Facility-Administered Medications Ordered in Other Visits  Medication Dose Route Frequency Provider Last Rate Last Admin   octreotide (SANDOSTATIN LAR) 30 MG IM injection            sodium chloride flush (NS) 0.9 % injection 10 mL  10 mL Intravenous PRN Baird Cancer, PA-C   10 mL at 06/19/17 1029    ALLERGIES:  Allergies  Allergen Reactions   Contrast Media [Iodinated Contrast Media]     Burning sensation all over body    PHYSICAL EXAM:  Performance status (ECOG): 1 - Symptomatic but completely ambulatory  Vitals:   01/22/22 1529  BP: (!) 144/77  Pulse: (!) 57  Resp: 16  Temp: 97.8 F (36.6 C)  SpO2: 100%   Wt Readings from Last 3 Encounters:  01/22/22 166 lb (75.3 kg)  10/17/21 167 lb 12.3 oz (76.1 kg)  07/16/21 167 lb 9.6 oz (76 kg)   Physical Exam Vitals reviewed.  Constitutional:      Appearance: Normal appearance.  Cardiovascular:     Rate and Rhythm: Normal rate and regular rhythm.     Pulses: Normal pulses.     Heart sounds: Normal heart sounds.  Pulmonary:     Effort: Pulmonary effort is normal.     Breath sounds: Normal breath sounds.  Neurological:     General: No focal deficit present.     Mental Status: He is alert and oriented to person, place, and time.  Psychiatric:        Mood and Affect: Mood normal.        Behavior: Behavior normal.      LABORATORY DATA:  I have reviewed the labs as listed.     Latest Ref Rng & Units 01/15/2022    9:20 AM 10/10/2021    8:59 AM 07/09/2021    9:03 AM  CBC  WBC 4.0 - 10.5 K/uL 4.1  4.0  3.8   Hemoglobin 13.0 - 17.0 g/dL 11.9  12.0  12.6   Hematocrit 39.0 - 52.0 % 39.0  40.9  40.8   Platelets 150 - 400 K/uL 103  122  140       Latest Ref Rng & Units 01/15/2022    9:20 AM 10/10/2021    8:59 AM 07/09/2021    9:03 AM  CMP  Glucose 70 - 99 mg/dL 113  88  112   BUN 6 - 20 mg/dL 17  21  9    Creatinine 0.61 -  1.24 mg/dL 0.86  0.99  1.02   Sodium 135 - 145 mmol/L 142  144  139    Potassium 3.5 - 5.1 mmol/L 3.8  3.3  3.5   Chloride 98 - 111 mmol/L 109  114  110   CO2 22 - 32 mmol/L 26  24  23    Calcium 8.9 - 10.3 mg/dL 8.8  9.0  8.3   Total Protein 6.5 - 8.1 g/dL 6.5  6.6  6.6   Total Bilirubin 0.3 - 1.2 mg/dL 1.6  1.3  1.3   Alkaline Phos 38 - 126 U/L 33  37  40   AST 15 - 41 U/L 17  14  18    ALT 0 - 44 U/L 16  13  14      DIAGNOSTIC IMAGING:  I have independently reviewed the scans and discussed with the patient. No results found.   ASSESSMENT:  1.  Relapsed kappa light chain multiple myeloma: -Diagnosis on 06/05/2003, presentation with T12 vertebral body fracture, kappa light chain myeloma diagnosed with 2.03 g of light chains/24-hour urine. -06/09/2003 -06/22/2003 radiation 19 Gray total T11-L1, 19 grade T2-T4 for T3 vertebral body involvement. -06/26/2003 chemotherapy with Doxil, Velcade and dexamethasone. 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 -06/22/2008 relapse of myeloma with kappa light chains and 24-hour urine found, treatment initiated with Revlimid/dexamethasone from 07/31/2008 through 08/10/2013. -Radiation therapy 25 centigrade to right hip for impending right femoral neck fracture from 08/27/2010 through 09/10/2010. -PET scan on 09/12/2019 did not show any myeloma lesions. -SPEP and immunofixation on 09/12/2019 was normal.  Kappa light chains have increased to 48.  Ratio has gone up to 4.32 and steadily increasing. -Creatinine and calcium were normal.  LDH was normal. -24-hour urine shows no M spike.  Immunofixation was normal.  Elevated light chain ratio present. -Bone marrow biopsy on 12/02/2019 shows normocellular marrow with plasma cells ranging from 10-20%.  Chromosome analysis was normal.  FISH panel was negative for high-risk abnormalities. -Skeletal survey on 01/02/2020 shows possible new or better demonstrated  lucencies in the T5 and T6 vertebral bodies. -Labs on 01/02/2020 shows kappa light chains 118.7, ratio of 11.2, uptrending.  SPEP is negative. -Revlimid 15 mg 3 weeks on/1 week off with weekly dexamethasone 20 mg started on 01/30/2020.   PLAN:  1.  Relapsed multiple myeloma: - Reviewed myeloma panel from 01/15/2022.  M spike is negative.  Immunofixation was normal.  Free light chain ratio is 2.38 and improved from 2.50.  Kappa light chains are 17.6. - He does not have any new onset bone pains. - Recommend continue Revlimid 15 mg 3 weeks on/1 week off along with dexamethasone 16 mg weekly. - Recommend RTC 3 months for follow-up in the clinic with repeat myeloma panel.   2.  Chronic mid back pain: - Continue methadone 10 mg every 12 hours.  Pain is well-controlled.   3.  Mild thrombocytopenia: - He has mild thrombocytopenia since 2017 which is stable.  Platelet count today is 103.  No bleeding issues.   4.  Thromboprophylaxis: - Continue aspirin 81 mg for thromboprophylaxis.  5.  Myeloma bone disease: - He had Zometa in the past at [facility].  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   Orders placed this encounter:  Orders Placed This Encounter  Procedures   CBC with Differential/Platelet   Comprehensive metabolic panel   Protein  electrophoresis, serum   Kappa/lambda light chains   Immunofixation electrophoresis      Derek Jack, MD [place] [phone]

## 2022-01-22 NOTE — Patient Instructions (Signed)
[place] at [facility] Discharge Instructions  You were seen and examined today by Dr. Delton Coombes.  Dr. Delton Coombes discussed your most recent lab work and everything looks stable. Continue taking Revlimid as prescribed.  Follow-up as scheduled in 3 months.    Thank you for choosing [place] at [facility] to provide your oncology and hematology care.  To afford each patient quality time with our provider, please arrive at least 15 minutes before your scheduled appointment time.   If you have a lab appointment with the [facility] please come in thru the Main Entrance and check in at the main information desk.  You need to re-schedule your appointment should you arrive 10 or more minutes late.  We strive to give you quality time with our providers, and arriving late affects you and other patients whose appointments are after yours.  Also, if you no show three or more times for appointments you may be dismissed from the clinic at the providers discretion.     Again, thank you for choosing [facility].  Our hope is that these requests will decrease the amount of time that you wait before being seen by our physicians.       _____________________________________________________________  Should you have questions after your visit to [facility], please contact our office at [phone] and follow the prompts.  Our office hours are 8:00 a.m. and 4:30 p.m. Monday - Friday.  Please note that voicemails left after 4:00 p.m. may not be returned until the following business day.  We are closed weekends and major holidays.  You do have access to a nurse 24-7, just call the main number to the clinic [phone] and do not press any options, hold on the line and a nurse will answer the phone.    For prescription refill requests, have your pharmacy contact our office and allow 72 hours.

## 2022-01-22 NOTE — Telephone Encounter (Signed)
Chart reviewed. Revlimid refilled per last office note with Dr. Katragadda.  

## 2022-01-29 ENCOUNTER — Other Ambulatory Visit: Payer: Self-pay | Admitting: *Deleted

## 2022-01-29 DIAGNOSIS — C9001 Multiple myeloma in remission: Secondary | ICD-10-CM

## 2022-01-29 DIAGNOSIS — G8929 Other chronic pain: Secondary | ICD-10-CM

## 2022-01-29 MED ORDER — METHADONE HCL 10 MG PO TABS: 10.0000 mg | ORAL_TABLET | Freq: Two times a day (BID) | ORAL | 0 refills | Status: DC

## 2022-02-18 ENCOUNTER — Other Ambulatory Visit: Payer: Self-pay | Admitting: Hematology

## 2022-02-18 DIAGNOSIS — C9002 Multiple myeloma in relapse: Secondary | ICD-10-CM

## 2022-02-19 ENCOUNTER — Other Ambulatory Visit: Payer: Self-pay

## 2022-02-19 DIAGNOSIS — C9002 Multiple myeloma in relapse: Secondary | ICD-10-CM

## 2022-02-19 MED ORDER — LENALIDOMIDE 15 MG PO CAPS: ORAL_CAPSULE | ORAL | 0 refills | Status: DC

## 2022-02-19 NOTE — Telephone Encounter (Signed)
Chart reviewed. Revlimid refilled per last office note with Dr. Katragadda.  

## 2022-02-27 ENCOUNTER — Other Ambulatory Visit: Payer: Self-pay | Admitting: *Deleted

## 2022-02-27 DIAGNOSIS — G8929 Other chronic pain: Secondary | ICD-10-CM

## 2022-02-27 DIAGNOSIS — C9001 Multiple myeloma in remission: Secondary | ICD-10-CM

## 2022-02-28 ENCOUNTER — Other Ambulatory Visit: Payer: Self-pay

## 2022-02-28 DIAGNOSIS — G8929 Other chronic pain: Secondary | ICD-10-CM

## 2022-02-28 DIAGNOSIS — C9001 Multiple myeloma in remission: Secondary | ICD-10-CM

## 2022-02-28 MED ORDER — METHADONE HCL 10 MG PO TABS: 10.0000 mg | ORAL_TABLET | Freq: Two times a day (BID) | ORAL | 0 refills | Status: DC

## 2022-03-18 ENCOUNTER — Other Ambulatory Visit: Payer: Self-pay

## 2022-03-18 DIAGNOSIS — C9002 Multiple myeloma in relapse: Secondary | ICD-10-CM

## 2022-03-18 MED ORDER — LENALIDOMIDE 15 MG PO CAPS: ORAL_CAPSULE | ORAL | 0 refills | Status: DC

## 2022-03-18 NOTE — Telephone Encounter (Signed)
Chart reviewed. Revlimid refilled per last office note with Dr. Katragadda.  

## 2022-03-19 ENCOUNTER — Other Ambulatory Visit: Payer: Self-pay | Admitting: Hematology

## 2022-03-19 DIAGNOSIS — C9002 Multiple myeloma in relapse: Secondary | ICD-10-CM

## 2022-03-28 ENCOUNTER — Other Ambulatory Visit: Payer: Self-pay

## 2022-03-28 DIAGNOSIS — C9001 Multiple myeloma in remission: Secondary | ICD-10-CM

## 2022-03-28 DIAGNOSIS — G8929 Other chronic pain: Secondary | ICD-10-CM

## 2022-03-28 MED ORDER — METHADONE HCL 10 MG PO TABS: 10.0000 mg | ORAL_TABLET | Freq: Two times a day (BID) | ORAL | 0 refills | Status: DC

## 2022-04-14 ENCOUNTER — Other Ambulatory Visit: Payer: Self-pay

## 2022-04-14 DIAGNOSIS — C9002 Multiple myeloma in relapse: Secondary | ICD-10-CM

## 2022-04-14 MED ORDER — LENALIDOMIDE 15 MG PO CAPS: ORAL_CAPSULE | ORAL | 0 refills | Status: DC

## 2022-04-14 NOTE — Telephone Encounter (Signed)
Chart reviewed. Revlimid refilled per last office note with Dr. Katragadda.  

## 2022-04-24 ENCOUNTER — Inpatient Hospital Stay: Payer: Medicare Other | Attending: Hematology

## 2022-04-24 DIAGNOSIS — D696 Thrombocytopenia, unspecified: Secondary | ICD-10-CM | POA: Insufficient documentation

## 2022-04-24 DIAGNOSIS — Z23 Encounter for immunization: Secondary | ICD-10-CM | POA: Insufficient documentation

## 2022-04-24 DIAGNOSIS — C9001 Multiple myeloma in remission: Secondary | ICD-10-CM | POA: Diagnosis present

## 2022-04-24 DIAGNOSIS — D61818 Other pancytopenia: Secondary | ICD-10-CM

## 2022-04-24 DIAGNOSIS — G8929 Other chronic pain: Secondary | ICD-10-CM

## 2022-04-24 LAB — COMPREHENSIVE METABOLIC PANEL
ALT: 10 U/L (ref 0–44)
AST: 16 U/L (ref 15–41)
Albumin: 3.7 g/dL (ref 3.5–5.0)
Alkaline Phosphatase: 31 U/L — ABNORMAL LOW (ref 38–126)
Anion gap: 6 (ref 5–15)
BUN: 24 mg/dL — ABNORMAL HIGH (ref 8–23)
CO2: 26 mmol/L (ref 22–32)
Calcium: 8.8 mg/dL — ABNORMAL LOW (ref 8.9–10.3)
Chloride: 113 mmol/L — ABNORMAL HIGH (ref 98–111)
Creatinine, Ser: 1.06 mg/dL (ref 0.61–1.24)
GFR, Estimated: >60 mL/min (ref >60)
Glucose, Bld: 94 mg/dL (ref 70–99)
Potassium: 3.5 mmol/L (ref 3.5–5.1)
Sodium: 145 mmol/L (ref 135–145)
Total Bilirubin: 1.3 mg/dL — ABNORMAL HIGH (ref 0.3–1.2)
Total Protein: 6.2 g/dL — ABNORMAL LOW (ref 6.5–8.1)

## 2022-04-24 LAB — CBC WITH DIFFERENTIAL/PLATELET
Abs Immature Granulocytes: 0.01 10*3/uL (ref 0.00–0.07)
Basophils Absolute: 0 10*3/uL (ref 0.0–0.1)
Basophils Relative: 1 %
Eosinophils Absolute: 0 10*3/uL (ref 0.0–0.5)
Eosinophils Relative: 0 %
HCT: 37.4 % — ABNORMAL LOW (ref 39.0–52.0)
Hemoglobin: 11.6 g/dL — ABNORMAL LOW (ref 13.0–17.0)
Immature Granulocytes: 0 %
Lymphocytes Relative: 30 %
Lymphs Abs: 1.3 10*3/uL (ref 0.7–4.0)
MCH: 24.8 pg — ABNORMAL LOW (ref 26.0–34.0)
MCHC: 31 g/dL (ref 30.0–36.0)
MCV: 79.9 fL — ABNORMAL LOW (ref 80.0–100.0)
Monocytes Absolute: 0.8 10*3/uL (ref 0.1–1.0)
Monocytes Relative: 18 %
Neutro Abs: 2.2 10*3/uL (ref 1.7–7.7)
Neutrophils Relative %: 51 %
Platelets: 110 10*3/uL — ABNORMAL LOW (ref 150–400)
RBC: 4.68 MIL/uL (ref 4.22–5.81)
RDW: 18.6 % — ABNORMAL HIGH (ref 11.5–15.5)
WBC: 4.3 10*3/uL (ref 4.0–10.5)
nRBC: 0 % (ref 0.0–0.2)

## 2022-04-25 LAB — KAPPA/LAMBDA LIGHT CHAINS
Kappa free light chain: 17.8 mg/L (ref 3.3–19.4)
Kappa, lambda light chain ratio: 2.12 — ABNORMAL HIGH (ref 0.26–1.65)
Lambda free light chains: 8.4 mg/L (ref 5.7–26.3)

## 2022-04-28 LAB — PROTEIN ELECTROPHORESIS, SERUM
A/G Ratio: 1.6 (ref 0.7–1.7)
Albumin ELP: 3.6 g/dL (ref 2.9–4.4)
Alpha-1-Globulin: 0.2 g/dL (ref 0.0–0.4)
Alpha-2-Globulin: 0.5 g/dL (ref 0.4–1.0)
Beta Globulin: 0.9 g/dL (ref 0.7–1.3)
Gamma Globulin: 0.6 g/dL (ref 0.4–1.8)
Globulin, Total: 2.2 g/dL (ref 2.2–3.9)
Total Protein ELP: 5.8 g/dL — ABNORMAL LOW (ref 6.0–8.5)

## 2022-04-28 LAB — IMMUNOFIXATION ELECTROPHORESIS
IgA: 184 mg/dL (ref 61–437)
IgG (Immunoglobin G), Serum: 674 mg/dL (ref 603–1613)
IgM (Immunoglobulin M), Srm: 32 mg/dL (ref 20–172)
Total Protein ELP: 5.6 g/dL — ABNORMAL LOW (ref 6.0–8.5)

## 2022-04-29 ENCOUNTER — Other Ambulatory Visit: Payer: Self-pay | Admitting: *Deleted

## 2022-04-29 DIAGNOSIS — C9001 Multiple myeloma in remission: Secondary | ICD-10-CM

## 2022-04-29 DIAGNOSIS — G8929 Other chronic pain: Secondary | ICD-10-CM

## 2022-04-29 MED ORDER — METHADONE HCL 10 MG PO TABS: 10.0000 mg | ORAL_TABLET | Freq: Two times a day (BID) | ORAL | 0 refills | Status: DC

## 2022-04-30 ENCOUNTER — Encounter: Payer: Self-pay | Admitting: Hematology

## 2022-04-30 ENCOUNTER — Inpatient Hospital Stay (HOSPITAL_BASED_OUTPATIENT_CLINIC_OR_DEPARTMENT_OTHER): Payer: Medicare Other | Admitting: Hematology

## 2022-04-30 ENCOUNTER — Inpatient Hospital Stay: Payer: Medicare Other

## 2022-04-30 VITALS — BP 151/80 | HR 59 | Temp 98.0°F | Resp 16 | Wt 163.2 lb

## 2022-04-30 DIAGNOSIS — C9001 Multiple myeloma in remission: Secondary | ICD-10-CM | POA: Diagnosis not present

## 2022-04-30 DIAGNOSIS — Z23 Encounter for immunization: Secondary | ICD-10-CM

## 2022-04-30 MED ORDER — INFLUENZA VAC SPLIT QUAD 0.5 ML IM SUSY
0.5000 mL | PREFILLED_SYRINGE | Freq: Once | INTRAMUSCULAR | Status: AC
  Administered 2022-04-30: 0.5 mL via INTRAMUSCULAR
  Filled 2022-04-30: qty 0.5

## 2022-04-30 NOTE — Progress Notes (Signed)
Steven [name], Steven [name] [id number]   CLINIC:  Medical Oncology/Hematology  PCP:  Steven Blitz, MD [address] [place] [address] [phone]   REASON FOR VISIT:  Follow-up for multiple myeloma  PRIOR THERAPY:  1. 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 2. Revlimid maintenance until 2015.  CURRENT THERAPY: Revlimid 3/4 weeks; Decadron weekly  BRIEF ONCOLOGIC HISTORY:  Oncology History  Multiple myeloma in remission ([place])  06/05/2003 Initial Diagnosis   Myeloma, presenting with excruciating back pain and T12 vertebral body fracture, kappa light chain multiple myeloma diagnosed with 2030 mg light chains per 24 hours and urine   06/09/2003 - 06/22/2003 Radiation Therapy   19 Gray to T11-L1 or T12 vertebral body fracture. [place] T2-T4 for T3 vertebral body involvement   06/26/2003 - 07/25/2004 Chemotherapy   Doxil, Velcade, and dexamethasone    02/27/2004 Bone Marrow Biopsy   Normal cellular bone marrow showing trilineage hematopoiesis with less than 10% plasma cells.   07/25/2004 Bone Marrow Transplant   Transplant after high-dose chemotherapy with melphalan 200 mg/m, but no maintenance therapy was given, transplant at [facility].   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  Followed by [facility] Department of dentistry.   06/22/2008 Progression   Relapse with The light chains in 24-hour urine found, therapy reinitiated with Revlimid/dexamethasone.   07/31/2008 - 08/10/2013 Chemotherapy   Revlimid/dexamethasone initiated for recurrent disease.    08/27/2010 - 09/10/2010 Radiation Therapy   2500 cGy to right hip for impending right femoral neck fracture   08/04/2013 Imaging   Bone density-normal with T score of -1.0.   01/19/2014 Imaging   MRI pelvis-partially visualized right L4-L5 and L5-S1 facet arthritis with periarticular edema. This can be associated with referred pain to the right hip. Small abnormal marrow signal  foci in the proximal right femur are likely representing treated myeloma. There is no cortical erosion, stress reaction or stress fracture. Similar lesions are present in the right supra-acetabular ileum.   02/08/2014 Imaging   Bone survey-no significant change in widespread myelomatous involvement of skeleton. Multiple pathologic fractures in the thoracic spine at L1 appears stable. No interval pathologic fracture identified.   12/26/2015 Tumor Marker   M-spike NOT OBSERVED; normal IgG, IgM, IgA, & kappa/lambda light chains.     02/26/2016 Treatment Plan Change   [facility] of medical onc[name]   02/26/2016 Tumor Marker   M-spike NOT OBSERVED; normal IgG, IgM, IgA, & kappa/lambda light chains.    03/22/2016 Imaging   Skeletal survey imaging: IMPRESSION: Stable appearance of widespread myelomatous involvement of the skeleton. No progressive lesions are observed. Stable appearing partial compressions of thoracic vertebral bodies. Stable vertebra plana at T12.   05/29/2016 Tumor Marker   M-spike NOT OBSERVED; normal IgG, IgM, IgA, & kappa/lambda light chains.    12/02/2019 Genetic Testing   FISH analysis       CANCER STAGING:  Cancer Staging  No matching staging information was found for the patient.  INTERVAL HISTORY:  Steven [name], a 61 y.o. male, seen for follow-up of multiple myeloma.  He is tolerating Revlimid and dexamethasone very well.  Denies any fevers, night sweats or weight loss in the last 6 months.  Back pain and neuropathy is stable.  No new pains.  REVIEW OF SYSTEMS:  Review of Systems  Constitutional:  Negative for appetite change and fever.  Gastrointestinal:  Negative for nausea  and vomiting.  Musculoskeletal:  Positive for back pain (stable).  Neurological:  Positive for numbness (feet).  All other systems reviewed and are negative.   PAST MEDICAL/SURGICAL HISTORY:  Past Medical History:  Diagnosis Date   Multiple  myeloma in remission ([place]) 11/26/2005   Past Surgical History:  Procedure Laterality Date   BACK SURGERY     CYSTOSCOPY/URETEROSCOPY/HOLMIUM LASER/STENT PLACEMENT Left 10/09/2020   Procedure: CYSTOSCOPY/LEFT RETROGRADE/URETEROSCOPY/HOLMIUM LASER/LEFT STENT PLACEMENT;  Surgeon: Steven Aloe, MD;  Location[facility];  Service: Urology;  Laterality: Left;  ONLY NEEDS 60 MIN   porth cath      SOCIAL HISTORY:  Social History   Socioeconomic History   Marital status: Married    Spouse name: Not on file   Number of children: Not on file   Years of education: Not on file   Highest education level: Not on file  Occupational History   Not on file  Tobacco Use   Smoking status: Never   Smokeless tobacco: Never  Vaping Use   Vaping Use: Never used  Substance and Sexual Activity   Alcohol use: No   Drug use: No   Sexual activity: Not on file  Other Topics Concern   Not on file  Social History Narrative   Not on file   Social Determinants of Health   Financial Resource Strain: Low Risk  (05/14/2020)   Overall Financial Resource Strain (CARDIA)    Difficulty of Paying Living Expenses: Not hard at all  Food Insecurity: No Food Insecurity (05/14/2020)   Hunger Vital Sign    Worried About Running Out of Food in the Last Year: Never true    [place] in the Last Year: Never true  Transportation Needs: No Transportation Needs (05/14/2020)   PRAPARE - Hydrologist (Medical): No    Lack of Transportation (Non-Medical): No  Physical Activity: Inactive (05/14/2020)   Exercise Vital Sign    Days of Exercise per Week: 0 days    Minutes of Exercise per Session: 0 min  Stress: No Stress Concern Present (05/14/2020)   [place]    Feeling of Stress : Not at all  Social Connections: Moderately Integrated (05/14/2020)   Social Connection and Isolation Panel [NHANES]     Frequency of Communication with Friends and Family: More than three times a week    Frequency of Social Gatherings with Friends and Family: Twice a week    Attends Religious Services: 1 to 4 times per year    Active Member of [employer] or Organizations: No    Attends Archivist Meetings: Never    Marital Status: Married  Human resources officer Violence: Not At Risk (05/14/2020)   Humiliation, Afraid, Rape, and Kick questionnaire    Fear of Current or Ex-Partner: No    Emotionally Abused: No    Physically Abused: No    Sexually Abused: No    FAMILY HISTORY:  Family History  Problem Relation Age of Onset   Diabetes Mother    Cancer Sister     CURRENT MEDICATIONS:  Current Outpatient Medications  Medication Sig Dispense Refill   ASPIRIN 81 PO Take 81 mg by mouth daily.     CALCIUM PO Take 400 mg by mouth daily.     chlorhexidine (PERIDEX) 0.12 % solution USE AS DIRECTED 15 MLS IN THE MOUTH OR THROAT 2 (TWO) TIMES DAILY. (Patient taking differently: Use as directed 15 mLs  in the mouth or throat daily.) 473 mL 2   dexamethasone (DECADRON) 4 MG tablet Take 4 tablets (16 mg total) by mouth once a week. Tuesday 80 tablet 4   folic acid (FOLVITE) 1 MG tablet Take 1 mg by mouth daily.     lenalidomide (REVLIMID) 15 MG capsule TAKE ONE CAPSULE DAILY FOR 21 DAYS AND 7 DAYS OFF 21 capsule 0   lidocaine-prilocaine (EMLA) cream Apply 1 application topically as needed [facility]).     methadone (DOLOPHINE) 10 MG tablet Take 1 tablet (10 mg total) by mouth every 12 (twelve) hours. 60 tablet 0   polyethylene glycol (MIRALAX / GLYCOLAX) packet Take 17 g by mouth daily as needed for mild constipation or moderate constipation.     potassium chloride SA (KLOR-CON M20) 20 MEQ tablet Take 1 tablet (20 mEq total) by mouth daily. 90 tablet 1   Pramox-PE-Glycerin-Petrolatum 1-0.25-14.4-15 % CREA Place 1 application rectally daily as needed (Irritation).     No current facility-administered medications for this  visit.   Facility-Administered Medications Ordered in Other Visits  Medication Dose Route Frequency Provider Last Rate Last Admin   influenza vac split quadrivalent PF (FLUARIX) injection 0.5 mL  0.5 mL Intramuscular Once Derek Jack, MD       octreotide (SANDOSTATIN LAR) 30 MG IM injection            sodium chloride flush (NS) 0.9 % injection 10 mL  10 mL Intravenous PRN Baird Cancer, PA-C   10 mL at 06/19/17 1029    ALLERGIES:  Allergies  Allergen Reactions   Contrast Media [Iodinated Contrast Media]     Burning sensation all over body    PHYSICAL EXAM:  Performance status (ECOG): 1 - Symptomatic but completely ambulatory  Vitals:   04/30/22 1516  BP: (!) 151/80  Pulse: (!) 59  Resp: 16  Temp: 98 F (36.7 C)   Wt Readings from Last 3 Encounters:  04/30/22 163 lb 3.2 oz (74 kg)  01/22/22 166 lb (75.3 kg)  10/17/21 167 lb 12.3 oz (76.1 kg)   Physical Exam Vitals reviewed.  Constitutional:      Appearance: Normal appearance.  Cardiovascular:     Rate and Rhythm: Normal rate and regular rhythm.     Pulses: Normal pulses.     Heart sounds: Normal heart sounds.  Pulmonary:     Effort: Pulmonary effort is normal.     Breath sounds: Normal breath sounds.  Neurological:     General: No focal deficit present.     Mental Status: He is alert and oriented to person, place, and time.  Psychiatric:        Mood and Affect: Mood normal.        Behavior: Behavior normal.      LABORATORY DATA:  I have reviewed the labs as listed.     Latest Ref Rng & Units 04/24/2022    9:16 AM 01/15/2022    9:20 AM 10/10/2021    8:59 AM  CBC  WBC 4.0 - 10.5 K/uL 4.3  4.1  4.0   Hemoglobin 13.0 - 17.0 g/dL 11.6  11.9  12.0   Hematocrit 39.0 - 52.0 % 37.4  39.0  40.9   Platelets 150 - 400 K/uL 110  103  122       Latest Ref Rng & Units 04/24/2022    9:16 AM 01/15/2022    9:20 AM 10/10/2021    8:59 AM  CMP  Glucose 70 - 99 mg/dL  94  113  88   BUN 8 - 23 mg/dL _0 Creatinine 0.61 - 1.24 mg/dL 1.06  0.86  0.99   Sodium 135 - 145 mmol/L 145  142  144   Potassium 3.5 - 5.1 mmol/L 3.5  3.8  3.3   Chloride 98 - 111 mmol/L 113  109  114   CO2 22 - 32 mmol/L _1 Calcium 8.9 - 10.3 mg/dL 8.8  8.8  9.0   Total Protein 6.5 - 8.1 g/dL 6.2  6.5  6.6   Total Bilirubin 0.3 - 1.2 mg/dL 1.3  1.6  1.3   Alkaline Phos 38 - 126 U/L 31  33  37   AST 15 - 41 U/L _2 ALT 0 - 44 U/L _3 DIAGNOSTIC IMAGING:  I have independently reviewed the scans and discussed with the patient. No results found.   ASSESSMENT:  1.  Relapsed kappa light chain multiple myeloma: -Diagnosis on 06/05/2003, presentation with T12 vertebral body fracture, kappa light chain myeloma diagnosed with 2.03 g of light chains/24-hour urine. -06/09/2003 -06/22/2003 radiation 19 Gray total T11-L1, 19 grade T2-T4 for T3 vertebral body involvement. -06/26/2003 chemotherapy with Doxil, Velcade and dexamethasone. 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 -06/22/2008 relapse of myeloma with kappa light chains and 24-hour urine found, treatment initiated with Revlimid/dexamethasone from 07/31/2008 through 08/10/2013. -Radiation therapy 25 centigrade to right hip for impending right femoral neck fracture from 08/27/2010 through 09/10/2010. -PET scan on 09/12/2019 did not show any myeloma lesions. -SPEP and immunofixation on 09/12/2019 was normal.  Kappa light chains have increased to 48.  Ratio has gone up to 4.32 and steadily increasing. -Creatinine and calcium were normal.  LDH was normal. -24-hour urine shows no M spike.  Immunofixation was normal.  Elevated light chain ratio present. -Bone marrow biopsy on 12/02/2019 shows normocellular marrow with plasma cells ranging from 10-20%.  Chromosome analysis was normal.  FISH panel was negative for  high-risk abnormalities. -Skeletal survey on 01/02/2020 shows possible new or better demonstrated lucencies in the T5 and T6 vertebral bodies. -Labs on 01/02/2020 shows kappa light chains 118.7, ratio of 11.2, uptrending.  SPEP is negative. -Revlimid 15 mg 3 weeks on/1 week off with weekly dexamethasone 20 mg started on 01/30/2020.   PLAN:  1.  Relapsed multiple myeloma: - He is tolerating Revlimid very well. - Reviewed myeloma labs from 04/24/2022 which showed M spike not detectable.  Free light chain ratio improved to 2.12.  Immunofixation is normal. - Routine labs are stable. - Continue Revlimid 15 mg 3 weeks on/1 week off.  Continue dexamethasone 16 mg weekly. - RTC 3 months for follow-up.  We will plan to check ferritin and iron panel because of mild microcytic anemia.   2.  Chronic mid back pain: - Continue methadone 10 mg every 12 hours.  Pain is well controlled.   3.  Mild thrombocytopenia: - Mild thrombocytopenia since 2017 is stable.  Platelet count is 110.  No bleeding issues.   4.  Thromboprophylaxis: - Continue aspirin 81 mg for thromboprophylaxis.  5.  Myeloma bone disease: - He had Zometa in the past at [facility].  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   Orders placed this encounter:  No  orders of the defined types were placed in this encounter.     Derek Jack, MD [place] [phone]

## 2022-04-30 NOTE — Progress Notes (Signed)
Flu vaccine given in R deltoid without incident.  Tolerated well & site CDI.  Patient discharged to home in stable condition ambulatory with wife at side.

## 2022-04-30 NOTE — Patient Instructions (Signed)

## 2022-04-30 NOTE — Patient Instructions (Addendum)
[place]  Discharge Instructions  You were seen and examined today by Dr. Delton Coombes.  Dr. Delton Coombes discussed your most recent lab work and CT scan which revealed that everything looks good and stable. Your hemoglobin is slightly low but the Revlimid can cause this. Dr. Delton Coombes will check your iron levels at your next visit.  Continue taking Revlimid as prescribed.  Follow-up as scheduled in 3 months with labs.    Thank you for choosing [facility] to provide your oncology and hematology care.   To afford each patient quality time with our provider, please arrive at least 15 minutes before your scheduled appointment time. You may need to reschedule your appointment if you arrive late (10 or more minutes). Arriving late affects you and other patients whose appointments are after yours.  Also, if you miss three or more appointments without notifying the office, you may be dismissed from the clinic at the provider's discretion.    Again, thank you for choosing [facility].  Our hope is that these requests will decrease the amount of time that you wait before being seen by our physicians.   If you have a lab appointment with the [facility] please come in thru the Main Entrance and check in at the main information desk.           _____________________________________________________________  Should you have questions after your visit to [facility], please contact our office at [phone] and follow the prompts.  Our office hours are 8:00 a.m. to 4:30 p.m. Monday - Thursday and 8:00 a.m. to 2:30 p.m. Friday.  Please note that voicemails left after 4:00 p.m. may not be returned until the following business day.  We are closed weekends and all major holidays.  You do have access to a nurse 24-7, just call the main number to the clinic [phone] and do not press any options, hold on the line and a nurse  will answer the phone.    For prescription refill requests, have your pharmacy contact our office and allow 72 hours.    Masks are optional in the cancer centers. If you would like for your care team to wear a mask while they are taking care of you, please let them know. You may have one support person who is at least 61 years old accompany you for your appointments.

## 2022-05-14 ENCOUNTER — Other Ambulatory Visit: Payer: Self-pay

## 2022-05-14 DIAGNOSIS — C9002 Multiple myeloma in relapse: Secondary | ICD-10-CM

## 2022-05-14 MED ORDER — LENALIDOMIDE 15 MG PO CAPS: ORAL_CAPSULE | ORAL | 0 refills | Status: DC

## 2022-05-14 NOTE — Telephone Encounter (Signed)
Chart reviewed. Revlimid refilled per last office note with Dr. Katragadda.  

## 2022-05-15 ENCOUNTER — Other Ambulatory Visit: Payer: Self-pay | Admitting: Hematology

## 2022-05-15 DIAGNOSIS — C9002 Multiple myeloma in relapse: Secondary | ICD-10-CM

## 2022-05-30 ENCOUNTER — Other Ambulatory Visit: Payer: Self-pay

## 2022-05-30 DIAGNOSIS — G8929 Other chronic pain: Secondary | ICD-10-CM

## 2022-05-30 DIAGNOSIS — C9001 Multiple myeloma in remission: Secondary | ICD-10-CM

## 2022-05-30 MED ORDER — METHADONE HCL 10 MG PO TABS: 10.0000 mg | ORAL_TABLET | Freq: Two times a day (BID) | ORAL | 0 refills | Status: DC

## 2022-06-11 ENCOUNTER — Other Ambulatory Visit: Payer: Self-pay

## 2022-06-11 DIAGNOSIS — C9002 Multiple myeloma in relapse: Secondary | ICD-10-CM

## 2022-06-11 MED ORDER — LENALIDOMIDE 15 MG PO CAPS: ORAL_CAPSULE | ORAL | 0 refills | Status: DC

## 2022-06-11 NOTE — Telephone Encounter (Signed)
Chart reviewed. Revlimid refilled per last office note with Dr. Katragadda.  

## 2022-06-12 ENCOUNTER — Other Ambulatory Visit: Payer: Self-pay | Admitting: Hematology

## 2022-06-12 DIAGNOSIS — C9002 Multiple myeloma in relapse: Secondary | ICD-10-CM

## 2022-06-23 ENCOUNTER — Other Ambulatory Visit: Payer: Self-pay

## 2022-06-23 DIAGNOSIS — C9002 Multiple myeloma in relapse: Secondary | ICD-10-CM

## 2022-06-23 MED ORDER — LENALIDOMIDE 15 MG PO CAPS: ORAL_CAPSULE | ORAL | 0 refills | Status: DC

## 2022-06-23 NOTE — Telephone Encounter (Signed)
Chart reviewed. Revlimid refilled per last office note with Dr. Katragadda.  

## 2022-07-01 ENCOUNTER — Other Ambulatory Visit: Payer: Self-pay | Admitting: Hematology

## 2022-07-01 ENCOUNTER — Other Ambulatory Visit: Payer: Self-pay | Admitting: *Deleted

## 2022-07-01 DIAGNOSIS — G8929 Other chronic pain: Secondary | ICD-10-CM

## 2022-07-01 DIAGNOSIS — C9001 Multiple myeloma in remission: Secondary | ICD-10-CM

## 2022-07-01 MED ORDER — METHADONE HCL 10 MG PO TABS: 10.0000 mg | ORAL_TABLET | Freq: Two times a day (BID) | ORAL | 0 refills | Status: DC

## 2022-07-17 ENCOUNTER — Other Ambulatory Visit: Payer: Self-pay | Admitting: Hematology

## 2022-07-17 DIAGNOSIS — C9002 Multiple myeloma in relapse: Secondary | ICD-10-CM

## 2022-07-17 MED ORDER — LENALIDOMIDE 15 MG PO CAPS: ORAL_CAPSULE | ORAL | 0 refills | Status: DC

## 2022-07-17 NOTE — Telephone Encounter (Signed)
Chart reviewed. Revlimid refilled per last office note with Dr. Katragadda.  

## 2022-07-18 ENCOUNTER — Telehealth: Payer: Self-pay

## 2022-07-18 NOTE — Telephone Encounter (Addendum)
Oral Oncology Patient Advocate Encounter  Received notification that patient was out of funds for co-pay assistance.   Was successful in securing patient a $12,000 grant from [employer] to provide copayment coverage for Lenalidomide.  This will keep the out of pocket expense at $0.     [employer] ID: [id number]   The billing information is as follows and has been shared with [place].    RxBin: [id number] PCN: [id number] Member ID: [id number] Group ID: [id number] Dates of Eligibility: 06/23/22 through 06/23/23  Fund:  Multiple Myeloma - Medicare [place], [facility] Patient [place]  [phone] (phone) [phone] (fax) 07/18/2022 3:13 PM

## 2022-07-23 ENCOUNTER — Other Ambulatory Visit: Payer: Self-pay | Admitting: Hematology

## 2022-07-23 DIAGNOSIS — C9001 Multiple myeloma in remission: Secondary | ICD-10-CM

## 2022-07-30 ENCOUNTER — Other Ambulatory Visit: Payer: Self-pay | Admitting: *Deleted

## 2022-07-30 DIAGNOSIS — G8929 Other chronic pain: Secondary | ICD-10-CM

## 2022-07-30 DIAGNOSIS — C9001 Multiple myeloma in remission: Secondary | ICD-10-CM

## 2022-07-30 MED ORDER — METHADONE HCL 10 MG PO TABS: 10.0000 mg | ORAL_TABLET | Freq: Two times a day (BID) | ORAL | 0 refills | Status: DC

## 2022-08-04 ENCOUNTER — Other Ambulatory Visit: Payer: Medicare Other

## 2022-08-04 ENCOUNTER — Inpatient Hospital Stay: Payer: Medicare Other | Attending: Hematology

## 2022-08-04 DIAGNOSIS — D509 Iron deficiency anemia, unspecified: Secondary | ICD-10-CM | POA: Insufficient documentation

## 2022-08-04 DIAGNOSIS — D696 Thrombocytopenia, unspecified: Secondary | ICD-10-CM | POA: Insufficient documentation

## 2022-08-04 DIAGNOSIS — C9001 Multiple myeloma in remission: Secondary | ICD-10-CM

## 2022-08-04 DIAGNOSIS — C9002 Multiple myeloma in relapse: Secondary | ICD-10-CM | POA: Diagnosis present

## 2022-08-04 DIAGNOSIS — Z7982 Long term (current) use of aspirin: Secondary | ICD-10-CM | POA: Insufficient documentation

## 2022-08-04 LAB — COMPREHENSIVE METABOLIC PANEL
ALT: 13 U/L (ref 0–44)
AST: 18 U/L (ref 15–41)
Albumin: 3.7 g/dL (ref 3.5–5.0)
Alkaline Phosphatase: 35 U/L — ABNORMAL LOW (ref 38–126)
Anion gap: 10 (ref 5–15)
BUN: 10 mg/dL (ref 8–23)
CO2: 24 mmol/L (ref 22–32)
Calcium: 8.7 mg/dL — ABNORMAL LOW (ref 8.9–10.3)
Chloride: 106 mmol/L (ref 98–111)
Creatinine, Ser: 0.97 mg/dL (ref 0.61–1.24)
GFR, Estimated: >60 mL/min (ref >60)
Glucose, Bld: 101 mg/dL — ABNORMAL HIGH (ref 70–99)
Potassium: 3.5 mmol/L (ref 3.5–5.1)
Sodium: 140 mmol/L (ref 135–145)
Total Bilirubin: 1.3 mg/dL — ABNORMAL HIGH (ref 0.3–1.2)
Total Protein: 6.2 g/dL — ABNORMAL LOW (ref 6.5–8.1)

## 2022-08-04 LAB — FERRITIN: Ferritin: 78 ng/mL (ref 24–336)

## 2022-08-04 LAB — CBC WITH DIFFERENTIAL/PLATELET
Abs Immature Granulocytes: 0.01 10*3/uL (ref 0.00–0.07)
Basophils Absolute: 0 10*3/uL (ref 0.0–0.1)
Basophils Relative: 1 %
Eosinophils Absolute: 0.2 10*3/uL (ref 0.0–0.5)
Eosinophils Relative: 5 %
HCT: 37.9 % — ABNORMAL LOW (ref 39.0–52.0)
Hemoglobin: 11.8 g/dL — ABNORMAL LOW (ref 13.0–17.0)
Immature Granulocytes: 0 %
Lymphocytes Relative: 36 %
Lymphs Abs: 1.2 10*3/uL (ref 0.7–4.0)
MCH: 24.6 pg — ABNORMAL LOW (ref 26.0–34.0)
MCHC: 31.1 g/dL (ref 30.0–36.0)
MCV: 79 fL — ABNORMAL LOW (ref 80.0–100.0)
Monocytes Absolute: 0.5 10*3/uL (ref 0.1–1.0)
Monocytes Relative: 14 %
Neutro Abs: 1.4 10*3/uL — ABNORMAL LOW (ref 1.7–7.7)
Neutrophils Relative %: 44 %
Platelets: 101 10*3/uL — ABNORMAL LOW (ref 150–400)
RBC: 4.8 MIL/uL (ref 4.22–5.81)
RDW: 18.6 % — ABNORMAL HIGH (ref 11.5–15.5)
WBC: 3.3 10*3/uL — ABNORMAL LOW (ref 4.0–10.5)
nRBC: 0 % (ref 0.0–0.2)

## 2022-08-04 LAB — IRON AND TIBC
Iron: 83 ug/dL (ref 45–182)
Saturation Ratios: 23 % (ref 17.9–39.5)
TIBC: 365 ug/dL (ref 250–450)
UIBC: 282 ug/dL

## 2022-08-05 LAB — KAPPA/LAMBDA LIGHT CHAINS
Kappa free light chain: 19.4 mg/L (ref 3.3–19.4)
Kappa, lambda light chain ratio: 1.78 — ABNORMAL HIGH (ref 0.26–1.65)
Lambda free light chains: 10.9 mg/L (ref 5.7–26.3)

## 2022-08-06 LAB — PROTEIN ELECTROPHORESIS, SERUM
A/G Ratio: 1.6 (ref 0.7–1.7)
Albumin ELP: 3.5 g/dL (ref 2.9–4.4)
Alpha-1-Globulin: 0.2 g/dL (ref 0.0–0.4)
Alpha-2-Globulin: 0.4 g/dL (ref 0.4–1.0)
Beta Globulin: 0.9 g/dL (ref 0.7–1.3)
Gamma Globulin: 0.7 g/dL (ref 0.4–1.8)
Globulin, Total: 2.2 g/dL (ref 2.2–3.9)
Total Protein ELP: 5.7 g/dL — ABNORMAL LOW (ref 6.0–8.5)

## 2022-08-08 LAB — IMMUNOFIXATION ELECTROPHORESIS
IgA: 181 mg/dL (ref 61–437)
IgG (Immunoglobin G), Serum: 686 mg/dL (ref 603–1613)
IgM (Immunoglobulin M), Srm: 33 mg/dL (ref 20–172)
Total Protein ELP: 5.6 g/dL — ABNORMAL LOW (ref 6.0–8.5)

## 2022-08-11 ENCOUNTER — Encounter: Payer: Self-pay | Admitting: Hematology

## 2022-08-11 ENCOUNTER — Inpatient Hospital Stay: Payer: Medicare Other | Admitting: Hematology

## 2022-08-11 VITALS — BP 162/74 | HR 65 | Temp 97.5°F | Resp 18 | Wt 165.0 lb

## 2022-08-11 DIAGNOSIS — C9001 Multiple myeloma in remission: Secondary | ICD-10-CM

## 2022-08-11 DIAGNOSIS — C9002 Multiple myeloma in relapse: Secondary | ICD-10-CM | POA: Diagnosis not present

## 2022-08-11 NOTE — Progress Notes (Signed)
[place] [address] [place] [address]    Clinic Day:  08/11/2022  Referring physician: Monico Blitz, MD  Patient Care Team: Monico Blitz, MD as PCP - General (Internal Medicine)   ASSESSMENT & PLAN:   Assessment: 1.  Relapsed kappa light chain multiple myeloma: -Diagnosis on 06/05/2003, presentation with T12 vertebral body fracture, kappa light chain myeloma diagnosed with 2.03 g of light chains/24-hour urine. -06/09/2003 -06/22/2003 radiation 19 Gray total T11-L1, 19 grade T2-T4 for T3 vertebral body involvement. -06/26/2003 chemotherapy with Doxil, Velcade and dexamethasone. 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 -06/22/2008 relapse of myeloma with kappa light chains and 24-hour urine found, treatment initiated with Revlimid/dexamethasone from 07/31/2008 through 08/10/2013. -Radiation therapy 25 centigrade to right hip for impending right femoral neck fracture from 08/27/2010 through 09/10/2010. -PET scan on 09/12/2019 did not show any myeloma lesions. -SPEP and immunofixation on 09/12/2019 was normal.  Kappa light chains have increased to 48.  Ratio has gone up to 4.32 and steadily increasing. -Creatinine and calcium were normal.  LDH was normal. -24-hour urine shows no M spike.  Immunofixation was normal.  Elevated light chain ratio present. -Bone marrow biopsy on 12/02/2019 shows normocellular marrow with plasma cells ranging from 10-20%.  Chromosome analysis was normal.  FISH panel was negative for high-risk abnormalities. -Skeletal survey on 01/02/2020 shows possible new or better demonstrated lucencies in the T5 and T6 vertebral bodies. -Labs on 01/02/2020 shows kappa light chains 118.7, ratio of 11.2, uptrending.  SPEP is negative. -Revlimid 15 mg 3 weeks on/1 week off with weekly dexamethasone 20 mg started on  01/30/2020. - He had Zometa in the past at [facility].  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    Plan: 1.  Relapsed multiple myeloma: - He is tolerating Revlimid very well.  No recent infections or hospitalizations. - Reviewed myeloma labs from 08/04/2022.  M spike is not detected.  Immunofixation was normal.  Free light chain ratio improved to 1.78. - Continue Revlimid 15 mg 3 weeks on/1 week off along with dexamethasone 16 mg weekly on Tuesdays. - RTC 3 months for follow-up with repeat myeloma labs.   2.  Chronic mid back pain: - Continue methadone 10 mg every 12 hours.  Pain is well-controlled.   3.  Mild thrombocytopenia: - Mild thrombocytopenia since 2017 is stable. - Latest platelet count is 101.  No bleeding issues.   4.  Thromboprophylaxis: - Continue aspirin 81 mg for thromboprophylaxis.  5.  Microcytic anemia: - He has mild microcytosis with MCV 79.  Hemoglobin 11.8.  Ferritin is 78 and percent saturation 23.  Will monitor closely.    Orders Placed This Encounter  Procedures   Protein electrophoresis, serum    Standing Status:   Future    Standing Expiration Date:   08/12/2023    Order Specific Question:   Release to patient    Answer:   Immediate   Kappa/lambda light chains    Standing Status:   Future    Standing Expiration Date:   08/12/2023   Immunofixation electrophoresis    Standing Status:   Future    Standing Expiration Date:   08/12/2023    Order Specific Question:   Release to patient    Answer:   Immediate   CBC with Differential/Platelet    Standing Status:   Future  Standing Expiration Date:   08/12/2023    Order Specific Question:   Release to patient    Answer:   Immediate   Comprehensive metabolic panel    Standing Status:   Future    Standing Expiration Date:   08/12/2023    Order Specific Question:   Release to patient    Answer:   Immediate      Kaleen Odea as a scribe for Derek Jack, MD.,have documented all  relevant documentation on the behalf of Derek Jack, MD,as directed by  Derek Jack, MD while in the presence of Derek Jack, MD.   I, Derek Jack MD, have reviewed the above documentation for accuracy and completeness, and I agree with the above.   Doyce Loose   2/26/20244:15 PM  CHIEF COMPLAINT:   Diagnosis: multiple myeloma     Cancer Staging  No matching staging information was found for the patient.   Prior Therapy: 1. Auto stem cell transplant at [facility] in 2006. 2. Revlimid maintenance until 2015.  Current Therapy:  Revlimid 3/4 weeks; Decadron weekly    HISTORY OF PRESENT ILLNESS:   Oncology History  Multiple myeloma in remission ([place])  06/05/2003 Initial Diagnosis   Myeloma, presenting with excruciating back pain and T12 vertebral body fracture, kappa light chain multiple myeloma diagnosed with 2030 mg light chains per 24 hours and urine   06/09/2003 - 06/22/2003 Radiation Therapy   19 Gray to T11-L1 or T12 vertebral body fracture. [place] T2-T4 for T3 vertebral body involvement   06/26/2003 - 07/25/2004 Chemotherapy   Doxil, Velcade, and dexamethasone    02/27/2004 Bone Marrow Biopsy   Normal cellular bone marrow showing trilineage hematopoiesis with less than 10% plasma cells.   07/25/2004 Bone Marrow Transplant   Transplant after high-dose chemotherapy with melphalan 200 mg/m, but no maintenance therapy was given, transplant at [facility].   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  Followed by [facility] Department of dentistry.   06/22/2008 Progression   Relapse with The light chains in 24-hour urine found, therapy reinitiated with Revlimid/dexamethasone.   07/31/2008 - 08/10/2013 Chemotherapy   Revlimid/dexamethasone initiated for recurrent disease.    08/27/2010 - 09/10/2010 Radiation Therapy   2500 cGy to right hip for impending right femoral neck fracture   08/04/2013 Imaging   Bone density-normal with  T score of -1.0.   01/19/2014 Imaging   MRI pelvis-partially visualized right L4-L5 and L5-S1 facet arthritis with periarticular edema. This can be associated with referred pain to the right hip. Small abnormal marrow signal foci in the proximal right femur are likely representing treated myeloma. There is no cortical erosion, stress reaction or stress fracture. Similar lesions are present in the right supra-acetabular ileum.   02/08/2014 Imaging   Bone survey-no significant change in widespread myelomatous involvement of skeleton. Multiple pathologic fractures in the thoracic spine at L1 appears stable. No interval pathologic fracture identified.   12/26/2015 Tumor Marker   M-spike NOT OBSERVED; normal IgG, IgM, IgA, & kappa/lambda light chains.     02/26/2016 Treatment Plan Change   Transfer of medical oncology care to [facility]   02/26/2016 Tumor Marker   M-spike NOT OBSERVED; normal IgG, IgM, IgA, & kappa/lambda light chains.    03/22/2016 Imaging   Skeletal survey imaging: IMPRESSION: Stable appearance of widespread myelomatous involvement of the skeleton. No progressive lesions are observed. Stable appearing partial compressions of thoracic vertebral bodies. Stable vertebra plana at T12.  05/29/2016 Tumor Marker   M-spike NOT OBSERVED; normal IgG, IgM, IgA, & kappa/lambda light chains.    12/02/2019 Genetic Testing   FISH analysis        INTERVAL HISTORY:   Steven [name] is a 62 y.o. male presenting to clinic today for follow up of multiple myeloma. He was last seen by me on 04/20/2022.  Today, he states that he is doing well overall. His appetite level is at 100%. His energy level is at 100%.  He denies any new pain and infections. He denies any recent ER visits. He denies diarrhea and GI problems. He denies swelling of the ankles.    PAST MEDICAL HISTORY:   Past Medical History: Past Medical History:  Diagnosis Date   Multiple myeloma in remission ([place])  11/26/2005    Surgical History: Past Surgical History:  Procedure Laterality Date   BACK SURGERY     CYSTOSCOPY/URETEROSCOPY/HOLMIUM LASER/STENT PLACEMENT Left 10/09/2020   Procedure: CYSTOSCOPY/LEFT RETROGRADE/URETEROSCOPY/HOLMIUM LASER/LEFT STENT PLACEMENT;  Surgeon: Festus Aloe, MD;  Location: [facility];  Service: Urology;  Laterality: Left;  ONLY NEEDS 60 MIN   porth cath      Social History: Social History   Socioeconomic History   Marital status: Married    Spouse name: Not on file   Number of children: Not on file   Years of education: Not on file   Highest education level: Not on file  Occupational History   Not on file  Tobacco Use   Smoking status: Never   Smokeless tobacco: Never  Vaping Use   Vaping Use: Never used  Substance and Sexual Activity   Alcohol use: No   Drug use: No   Sexual activity: Not on file  Other Topics Concern   Not on file  Social History Narrative   Not on file   Social Determinants of Health   Financial Resource Strain: Low Risk  (05/14/2020)   Overall Financial Resource Strain (CARDIA)    Difficulty of Paying Living Expenses: Not hard at all  Food Insecurity: No Food Insecurity (05/14/2020)   Hunger Vital Sign    Worried About Running Out of Food in the Last Year: Never true    Bent in the Last Year: Never true  Transportation Needs: No Transportation Needs (05/14/2020)   PRAPARE - Hydrologist (Medical): No    Lack of Transportation (Non-Medical): No  Physical Activity: Inactive (05/14/2020)   Exercise Vital Sign    Days of Exercise per Week: 0 days    Minutes of Exercise per Session: 0 min  Stress: No Stress Concern Present (05/14/2020)   [place]    Feeling of Stress : Not at all  Social Connections: Moderately Integrated (05/14/2020)   Social Connection and Isolation Panel [NHANES]     Frequency of Communication with Friends and Family: More than three times a week    Frequency of Social Gatherings with Friends and Family: Twice a week    Attends Religious Services: 1 to 4 times per year    Active Member of [employer] or Organizations: No    Attends Archivist Meetings: Never    Marital Status: Married  Human resources officer Violence: Not At Risk (05/14/2020)   Humiliation, Afraid, Rape, and Kick questionnaire    Fear of Current or Ex-Partner: No    Emotionally Abused: No    Physically Abused: No    Sexually Abused: No  Family History: Family History  Problem Relation Age of Onset   Diabetes Mother    Cancer Sister     Current Medications:  Current Outpatient Medications:    ASPIRIN 81 PO, Take 81 mg by mouth daily., Disp: , Rfl:    CALCIUM PO, Take 400 mg by mouth daily., Disp: , Rfl:    chlorhexidine (PERIDEX) 0.12 % solution, USE AS DIRECTED 15 MLS IN THE MOUTH OR THROAT 2 (TWO) TIMES DAILY. (Patient taking differently: Use as directed 15 mLs in the mouth or throat daily.), Disp: 473 mL, Rfl: 2   dexamethasone (DECADRON) 4 MG tablet, TAKE 5 TABLETS (20 MG TOTAL) BY MOUTH ONCE A WEEK. TUESDAY, Disp: 80 tablet, Rfl: 4   folic acid (FOLVITE) 1 MG tablet, Take 1 mg by mouth daily., Disp: , Rfl:    KLOR-CON M20 20 MEQ tablet, TAKE 1 TABLET BY MOUTH EVERY DAY, Disp: 90 tablet, Rfl: 1   lenalidomide (REVLIMID) 15 MG capsule, TAKE 1 CAPSULE DAILY FOR 21 DAYS AND 7 DAYS OFF, Disp: 21 capsule, Rfl: 0   lidocaine-prilocaine (EMLA) cream, Apply 1 application topically as needed [facility])., Disp: , Rfl:    methadone (DOLOPHINE) 10 MG tablet, Take 1 tablet (10 mg total) by mouth every 12 (twelve) hours., Disp: 60 tablet, Rfl: 0   polyethylene glycol (MIRALAX / GLYCOLAX) packet, Take 17 g by mouth daily as needed for mild constipation or moderate constipation., Disp: , Rfl:    Pramox-PE-Glycerin-Petrolatum 1-0.25-14.4-15 % CREA, Place 1 application rectally daily as needed  (Irritation)., Disp: , Rfl:  No current facility-administered medications for this visit.  Facility-Administered Medications Ordered in Other Visits:    octreotide (SANDOSTATIN LAR) 30 MG IM injection, , , ,    sodium chloride flush (NS) 0.9 % injection 10 mL, 10 mL, Intravenous, PRN, B[facility], PA-C, 10 mL at 06/19/17 1029   Allergies: Allergies  Allergen Reactions   Contrast Media [Iodinated Contrast Media]     Burning sensation all over body    REVIEW OF SYSTEMS:   Review of Systems  Constitutional:  Negative for chills, fatigue and fever.  HENT:   Negative for lump/mass, mouth sores, nosebleeds, sore throat and trouble swallowing.   Eyes:  Negative for eye problems.  Respiratory:  Negative for cough and shortness of breath.   Cardiovascular:  Negative for chest pain, leg swelling and palpitations.  Gastrointestinal:  Negative for abdominal pain, constipation, diarrhea, nausea and vomiting.  Genitourinary:  Negative for bladder incontinence, difficulty urinating, dysuria, frequency, hematuria and nocturia.   Musculoskeletal:  Negative for arthralgias, back pain, flank pain, myalgias and neck pain.  Skin:  Negative for itching and rash.  Neurological:  Negative for dizziness, headaches and numbness.  Hematological:  Does not bruise/bleed easily.  Psychiatric/Behavioral:  Negative for depression, sleep disturbance and suicidal ideas. The patient is not nervous/anxious.   All other systems reviewed and are negative.    VITALS:   Blood pressure (!) 162/74, pulse 65, temperature (!) 97.5 F (36.4 C), temperature source Oral, resp. rate 18, weight 165 lb (74.8 kg), SpO2 100 %.  Wt Readings from Last 3 Encounters:  08/11/22 165 lb (74.8 kg)  04/30/22 163 lb 3.2 oz (74 kg)  01/22/22 166 lb (75.3 kg)    Body mass index is 29.24 kg/m.  Performance status (ECOG): 1 - Symptomatic but completely ambulatory  PHYSICAL EXAM:   Physical Exam Vitals and nursing note  reviewed. Exam conducted with a chaperone present.  Constitutional:  Appearance: Normal appearance.  Cardiovascular:     Rate and Rhythm: Normal rate and regular rhythm.     Pulses: Normal pulses.     Heart sounds: Normal heart sounds.  Pulmonary:     Effort: Pulmonary effort is normal.     Breath sounds: Normal breath sounds.  Abdominal:     Palpations: Abdomen is soft. There is no hepatomegaly, splenomegaly or mass.     Tenderness: There is no abdominal tenderness.  Musculoskeletal:     Right lower leg: No edema.     Left lower leg: No edema.  Lymphadenopathy:     Cervical: No cervical adenopathy.     Right cervical: No superficial, deep or posterior cervical adenopathy.    Left cervical: No superficial, deep or posterior cervical adenopathy.     Upper Body:     Right upper body: No supraclavicular or axillary adenopathy.     Left upper body: No supraclavicular or axillary adenopathy.  Neurological:     General: No focal deficit present.     Mental Status: He is alert and oriented to person, place, and time.  Psychiatric:        Mood and Affect: Mood normal.        Behavior: Behavior normal.     LABS:      Latest Ref Rng & Units 08/04/2022    9:08 AM 04/24/2022    9:16 AM 01/15/2022    9:20 AM  CBC  WBC 4.0 - 10.5 K/uL 3.3  4.3  4.1   Hemoglobin 13.0 - 17.0 g/dL 11.8  11.6  11.9   Hematocrit 39.0 - 52.0 % 37.9  37.4  39.0   Platelets 150 - 400 K/uL 101  110  103       Latest Ref Rng & Units 08/04/2022    9:08 AM 04/24/2022    9:16 AM 01/15/2022    9:20 AM  CMP  Glucose 70 - 99 mg/dL 101  94  113   BUN 8 - 23 mg/dL '10  24  17   '$ Creatinine 0.61 - 1.24 mg/dL 0.97  1.06  0.86   Sodium 135 - 145 mmol/L 140  145  142   Potassium 3.5 - 5.1 mmol/L 3.5  3.5  3.8   Chloride 98 - 111 mmol/L 106  113  109   CO2 22 - 32 mmol/L '24  26  26   '$ Calcium 8.9 - 10.3 mg/dL 8.7  8.8  8.8   Total Protein 6.5 - 8.1 g/dL 6.2  6.2  6.5   Total Bilirubin 0.3 - 1.2 mg/dL 1.3  1.3  1.6    Alkaline Phos 38 - 126 U/L 35  31  33   AST 15 - 41 U/L '18  16  17   '$ ALT 0 - 44 U/L '13  10  16      '$ No results found for: "CEA1", "CEA" / No results found for: "CEA1", "CEA" No results found for: "PSA1" No results found for: "CAN199" No results found for: "CAN125"  Lab Results  Component Value Date   TOTALPROTELP 5.7 (L) 08/04/2022   TOTALPROTELP 5.6 (L) 08/04/2022   ALBUMINELP 3.5 08/04/2022   A1GS 0.2 08/04/2022   A2GS 0.4 08/04/2022   BETS 0.9 08/04/2022   GAMS 0.7 08/04/2022   MSPIKE Not Observed 08/04/2022   SPEI Comment 08/04/2022   Lab Results  Component Value Date   TIBC 365 08/04/2022   TIBC 333 02/26/2016   FERRITIN 78 08/04/2022  FERRITIN 80 02/26/2016   IRONPCTSAT 23 08/04/2022   IRONPCTSAT 27 02/26/2016   Lab Results  Component Value Date   LDH 122 10/10/2021   LDH 115 04/01/2021   LDH 145 09/18/2020     STUDIES:   No results found.

## 2022-08-11 NOTE — Patient Instructions (Addendum)
[place]  Discharge Instructions  You were seen and examined today by Dr. Delton Coombes.  Dr. Delton Coombes discussed your most recent lab work which revealed that everything looks good.  Keep taking the Revlimid as prescribed.  Follow-up as scheduled in 3 months with labs prior.    Thank you for choosing [facility] to provide your oncology and hematology care.   To afford each patient quality time with our provider, please arrive at least 15 minutes before your scheduled appointment time. You may need to reschedule your appointment if you arrive late (10 or more minutes). Arriving late affects you and other patients whose appointments are after yours.  Also, if you miss three or more appointments without notifying the office, you may be dismissed from the clinic at the provider's discretion.    Again, thank you for choosing [facility].  Our hope is that these requests will decrease the amount of time that you wait before being seen by our physicians.   If you have a lab appointment with the [facility] please come in thru the Main Entrance and check in at the main information desk.           _____________________________________________________________  Should you have questions after your visit to [facility], please contact our office at [phone] and follow the prompts.  Our office hours are 8:00 a.m. to 4:30 p.m. Monday - Thursday and 8:00 a.m. to 2:30 p.m. Friday.  Please note that voicemails left after 4:00 p.m. may not be returned until the following business day.  We are closed weekends and all major holidays.  You do have access to a nurse 24-7, just call the main number to the clinic [phone] and do not press any options, hold on the line and a nurse will answer the phone.    For prescription refill requests, have your pharmacy contact our office and allow 72 hours.    Masks are optional  in the cancer centers. If you would like for your care team to wear a mask while they are taking care of you, please let them know. You may have one support person who is at least 62 years old accompany you for your appointments.

## 2022-08-12 ENCOUNTER — Other Ambulatory Visit: Payer: Self-pay | Admitting: Hematology

## 2022-08-12 DIAGNOSIS — C9002 Multiple myeloma in relapse: Secondary | ICD-10-CM

## 2022-08-12 NOTE — Telephone Encounter (Signed)
Chart reviewed. Revlimid refilled per last office note with Dr. Katragadda.  

## 2022-08-27 ENCOUNTER — Other Ambulatory Visit: Payer: Self-pay

## 2022-08-27 DIAGNOSIS — C9001 Multiple myeloma in remission: Secondary | ICD-10-CM

## 2022-08-27 DIAGNOSIS — G8929 Other chronic pain: Secondary | ICD-10-CM

## 2022-08-29 MED ORDER — METHADONE HCL 10 MG PO TABS: 10.0000 mg | ORAL_TABLET | Freq: Two times a day (BID) | ORAL | 0 refills | Status: DC

## 2022-09-09 ENCOUNTER — Other Ambulatory Visit: Payer: Self-pay

## 2022-09-09 DIAGNOSIS — C9002 Multiple myeloma in relapse: Secondary | ICD-10-CM

## 2022-09-09 MED ORDER — LENALIDOMIDE 15 MG PO CAPS: ORAL_CAPSULE | ORAL | 0 refills | Status: DC

## 2022-09-09 NOTE — Telephone Encounter (Signed)
Chart reviewed. Revlimid refilled per last office note with Dr. Katragadda.  

## 2022-09-11 ENCOUNTER — Other Ambulatory Visit: Payer: Self-pay | Admitting: Hematology

## 2022-09-11 DIAGNOSIS — C9002 Multiple myeloma in relapse: Secondary | ICD-10-CM

## 2022-09-24 ENCOUNTER — Telehealth: Payer: Self-pay | Admitting: *Deleted

## 2022-09-24 NOTE — Telephone Encounter (Signed)
Patient has tooth that has broken into the gumline and needs to see dentist for treatment. Per Dr. Ellin Saba, counts are within acceptable limits to have dental work done.  Referral sent to Dr. Dolly Rias, DDS [address] [place], [place] [address] [phone].  Patient aware to expect a call from them for appointment.

## 2022-09-29 ENCOUNTER — Other Ambulatory Visit: Payer: Self-pay | Admitting: *Deleted

## 2022-09-29 DIAGNOSIS — G8929 Other chronic pain: Secondary | ICD-10-CM

## 2022-09-29 DIAGNOSIS — C9001 Multiple myeloma in remission: Secondary | ICD-10-CM

## 2022-09-30 MED ORDER — METHADONE HCL 10 MG PO TABS: 10.0000 mg | ORAL_TABLET | Freq: Two times a day (BID) | ORAL | 0 refills | Status: DC

## 2022-10-07 ENCOUNTER — Other Ambulatory Visit: Payer: Self-pay | Admitting: Hematology

## 2022-10-07 DIAGNOSIS — C9002 Multiple myeloma in relapse: Secondary | ICD-10-CM

## 2022-10-07 NOTE — Telephone Encounter (Signed)
Chart reviewed. Revlimid refilled per last office note with Dr. Katragadda.  

## 2022-10-28 ENCOUNTER — Other Ambulatory Visit: Payer: Self-pay | Admitting: *Deleted

## 2022-10-28 ENCOUNTER — Other Ambulatory Visit: Payer: Self-pay | Admitting: Hematology

## 2022-10-28 DIAGNOSIS — C9001 Multiple myeloma in remission: Secondary | ICD-10-CM

## 2022-10-28 DIAGNOSIS — G8929 Other chronic pain: Secondary | ICD-10-CM

## 2022-10-28 MED ORDER — METHADONE HCL 10 MG PO TABS: 10.0000 mg | ORAL_TABLET | Freq: Two times a day (BID) | ORAL | 0 refills | Status: DC

## 2022-11-02 ENCOUNTER — Other Ambulatory Visit: Payer: Self-pay | Admitting: Hematology

## 2022-11-02 DIAGNOSIS — C9002 Multiple myeloma in relapse: Secondary | ICD-10-CM

## 2022-11-03 ENCOUNTER — Other Ambulatory Visit: Payer: Self-pay

## 2022-11-03 DIAGNOSIS — C9002 Multiple myeloma in relapse: Secondary | ICD-10-CM

## 2022-11-03 MED ORDER — LENALIDOMIDE 15 MG PO CAPS: ORAL_CAPSULE | ORAL | 0 refills | Status: DC

## 2022-11-03 NOTE — Telephone Encounter (Signed)
Chart reviewed. Revlimid refilled per last office note with Dr. Katragadda.  

## 2022-11-13 ENCOUNTER — Inpatient Hospital Stay: Payer: Medicare Other | Attending: Hematology

## 2022-11-13 DIAGNOSIS — C9001 Multiple myeloma in remission: Secondary | ICD-10-CM

## 2022-11-13 DIAGNOSIS — C9002 Multiple myeloma in relapse: Secondary | ICD-10-CM | POA: Insufficient documentation

## 2022-11-13 LAB — CBC WITH DIFFERENTIAL/PLATELET
Abs Immature Granulocytes: 0.01 10*3/uL (ref 0.00–0.07)
Basophils Absolute: 0 10*3/uL (ref 0.0–0.1)
Basophils Relative: 1 %
Eosinophils Absolute: 0.1 10*3/uL (ref 0.0–0.5)
Eosinophils Relative: 3 %
HCT: 36.9 % — ABNORMAL LOW (ref 39.0–52.0)
Hemoglobin: 11.5 g/dL — ABNORMAL LOW (ref 13.0–17.0)
Immature Granulocytes: 0 %
Lymphocytes Relative: 45 %
Lymphs Abs: 1.6 10*3/uL (ref 0.7–4.0)
MCH: 25 pg — ABNORMAL LOW (ref 26.0–34.0)
MCHC: 31.2 g/dL (ref 30.0–36.0)
MCV: 80.2 fL (ref 80.0–100.0)
Monocytes Absolute: 0.4 10*3/uL (ref 0.1–1.0)
Monocytes Relative: 11 %
Neutro Abs: 1.4 10*3/uL — ABNORMAL LOW (ref 1.7–7.7)
Neutrophils Relative %: 40 %
Platelets: 107 10*3/uL — ABNORMAL LOW (ref 150–400)
RBC: 4.6 MIL/uL (ref 4.22–5.81)
RDW: 18.7 % — ABNORMAL HIGH (ref 11.5–15.5)
WBC: 3.6 10*3/uL — ABNORMAL LOW (ref 4.0–10.5)
nRBC: 0 % (ref 0.0–0.2)

## 2022-11-13 LAB — COMPREHENSIVE METABOLIC PANEL
ALT: 17 U/L (ref 0–44)
AST: 18 U/L (ref 15–41)
Albumin: 3.7 g/dL (ref 3.5–5.0)
Alkaline Phosphatase: 32 U/L — ABNORMAL LOW (ref 38–126)
Anion gap: 8 (ref 5–15)
BUN: 14 mg/dL (ref 8–23)
CO2: 25 mmol/L (ref 22–32)
Calcium: 8.3 mg/dL — ABNORMAL LOW (ref 8.9–10.3)
Chloride: 104 mmol/L (ref 98–111)
Creatinine, Ser: 0.96 mg/dL (ref 0.61–1.24)
GFR, Estimated: >60 mL/min (ref >60)
Glucose, Bld: 95 mg/dL (ref 70–99)
Potassium: 3.4 mmol/L — ABNORMAL LOW (ref 3.5–5.1)
Sodium: 137 mmol/L (ref 135–145)
Total Bilirubin: 1.2 mg/dL (ref 0.3–1.2)
Total Protein: 6.3 g/dL — ABNORMAL LOW (ref 6.5–8.1)

## 2022-11-14 ENCOUNTER — Inpatient Hospital Stay: Payer: Medicare Other

## 2022-11-14 LAB — KAPPA/LAMBDA LIGHT CHAINS
Kappa free light chain: 13.4 mg/L (ref 3.3–19.4)
Kappa, lambda light chain ratio: 1.34 (ref 0.26–1.65)
Lambda free light chains: 10 mg/L (ref 5.7–26.3)

## 2022-11-19 LAB — PROTEIN ELECTROPHORESIS, SERUM
A/G Ratio: 1.5 (ref 0.7–1.7)
Albumin ELP: 3.4 g/dL (ref 2.9–4.4)
Alpha-1-Globulin: 0.2 g/dL (ref 0.0–0.4)
Alpha-2-Globulin: 0.5 g/dL (ref 0.4–1.0)
Beta Globulin: 1 g/dL (ref 0.7–1.3)
Gamma Globulin: 0.7 g/dL (ref 0.4–1.8)
Globulin, Total: 2.3 g/dL (ref 2.2–3.9)
Total Protein ELP: 5.7 g/dL — ABNORMAL LOW (ref 6.0–8.5)

## 2022-11-19 NOTE — Progress Notes (Signed)
[facility] [address] [place] [address]    Clinic Day:  11/20/2022  Referring physician: Kirstie Peri, MD  Patient Care Team: Kirstie Peri, MD as PCP - General (Internal Medicine)   ASSESSMENT & PLAN:   Assessment: 1.  Relapsed kappa light chain multiple myeloma: -Diagnosis on 06/05/2003, presentation with T12 vertebral body fracture, kappa light chain myeloma diagnosed with 2.03 g of light chains/24-hour urine. -06/09/2003 -06/22/2003 radiation 19 Gray total T11-L1, 19 grade T2-T4 for T3 vertebral body involvement. -06/26/2003 chemotherapy with Doxil, Velcade and dexamethasone. 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 -06/22/2008 relapse of myeloma with kappa light chains and 24-hour urine found, treatment initiated with Revlimid/dexamethasone from 07/31/2008 through 08/10/2013. -Radiation therapy 25 centigrade to right hip for impending right femoral neck fracture from 08/27/2010 through 09/10/2010. -PET scan on 09/12/2019 did not show any myeloma lesions. -SPEP and immunofixation on 09/12/2019 was normal.  Kappa light chains have increased to 48.  Ratio has gone up to 4.32 and steadily increasing. -Creatinine and calcium were normal.  LDH was normal. -24-hour urine shows no M spike.  Immunofixation was normal.  Elevated light chain ratio present. -Bone marrow biopsy on 12/02/2019 shows normocellular marrow with plasma cells ranging from 10-20%.  Chromosome analysis was normal.  FISH panel was negative for high-risk abnormalities. -Skeletal survey on 01/02/2020 shows possible new or better demonstrated lucencies in the T5 and T6 vertebral bodies. -Labs on 01/02/2020 shows kappa light chains 118.7, ratio of 11.2, uptrending.  SPEP is negative. -Revlimid 15 mg 3 weeks on/1 week off with weekly dexamethasone 20 mg started on  01/30/2020. - He had Zometa in the past at [facility].  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    Plan: 1.  Relapsed multiple myeloma: - He is tolerating Revlimid very well. - Reviewed myeloma labs from 11/13/2022: M spike is not detected.  Free light chain ratio improved to normal from 1.78-1.34.  Kappa and lambda light chains are normal. - CBC shows mild leukopenia and thrombocytopenia, treatment-related.  Creatinine and calcium and LFTs are normal. - Continue Revlimid 10 mg 3 weeks on/1 week off with dexamethasone 60 mg weekly. - He reportedly has right lower jaw dental infection and is following with his dentist. - Recommend follow-up in 3 months with repeat myeloma labs.   2.  Chronic mid back pain: - Continue methadone 10 mg every 12 hours.  Pain is well-controlled.   3.  Mild thrombocytopenia: - Mild thrombocytopenia since 2017 is stable. - Latest platelet count is 107.  No bleeding issues.   4.  Thromboprophylaxis: -Continue aspirin 81 mg for thromboprophylaxis.  5.  Microcytic anemia: - Latest hemoglobin is 11.4 with MCV 80.  Ferritin on 08/04/2022 was 78. - Will plan on checking ferritin and iron panel at next visit.    Orders Placed This Encounter  Procedures   CBC with Differential/Platelet    Standing Status:   Future    Standing Expiration Date:   11/20/2023    Order Specific Question:   Release to patient    Answer:   Immediate   Comprehensive metabolic panel    Standing Status:   Future    Standing Expiration Date:   11/20/2023    Order Specific Question:   Release to patient    Answer:   Immediate   Protein electrophoresis, serum  Standing Status:   Future    Standing Expiration Date:   11/20/2023    Order Specific Question:   Release to patient    Answer:   Immediate   Kappa/lambda light chains    Standing Status:   Future    Standing Expiration Date:   11/20/2023   Ferritin    Standing Status:   Future    Standing Expiration Date:   11/20/2023    Order  Specific Question:   Release to patient    Answer:   Immediate   Iron and TIBC    Standing Status:   Future    Standing Expiration Date:   11/20/2023    Order Specific Question:   Release to patient    Answer:   Immediate      I,Katie Daubenspeck,acting as a scribe for Doreatha Massed, MD.,have documented all relevant documentation on the behalf of Doreatha Massed, MD,as directed by  Doreatha Massed, MD while in the presence of Doreatha Massed, MD.   I, Doreatha Massed MD, have reviewed the above documentation for accuracy and completeness, and I agree with the above.   Doreatha Massed, MD   6/6/20244:51 PM  CHIEF COMPLAINT:   Diagnosis: multiple myeloma    Cancer Staging  No matching staging information was found for the patient.   Prior Therapy: 1. 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 2. Revlimid maintenance until 2015.  Current Therapy:  Revlimid 3/4 weeks; Decadron weekly    HISTORY OF PRESENT ILLNESS:   Oncology History  Multiple myeloma in remission (HCC)  06/05/2003 Initial Diagnosis   Myeloma, presenting with excruciating back pain and T12 vertebral body fracture, kappa light chain multiple myeloma diagnosed with 2030 mg light chains per 24 hours and urine   06/09/2003 - 06/22/2003 Radiation Therapy   19 Gray to T11-L1 or T12 vertebral body fracture. 19 Gray T2-T4 for T3 vertebral body involvement   06/26/2003 - 07/25/2004 Chemotherapy   Doxil, Velcade, and dexamethasone    02/27/2004 Bone Marrow Biopsy   Normal cellular bone marrow showing trilineage hematopoiesis with less than 10% plasma cells.   07/25/2004 Bone Marrow Transplant   Transplant after high-dose chemotherapy with melphalan 200 mg/m, but no maintenance therapy was given, transplant at [facility].   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  Followed by [facility] Department of dentistry.   06/22/2008 Progression   Relapse with The light chains in  24-hour urine found, therapy reinitiated with Revlimid/dexamethasone.   07/31/2008 - 08/10/2013 Chemotherapy   Revlimid/dexamethasone initiated for recurrent disease.    08/27/2010 - 09/10/2010 Radiation Therapy   2500 cGy to right hip for impending right femoral neck fracture   08/04/2013 Imaging   Bone density-normal with T score of -1.0.   01/19/2014 Imaging   MRI pelvis-partially visualized right L4-L5 and L5-S1 facet arthritis with periarticular edema. This can be associated with referred pain to the right hip. Small abnormal marrow signal foci in the proximal right femur are likely representing treated myeloma. There is no cortical erosion, stress reaction or stress fracture. Similar lesions are present in the right supra-acetabular ileum.   02/08/2014 Imaging   Bone survey-no significant change in widespread myelomatous involvement of skeleton. Multiple pathologic fractures in the thoracic spine at L1 appears stable. No interval pathologic fracture identified.   12/26/2015 Tumor Marker   M-spike NOT OBSERVED; normal IgG, IgM, IgA, & kappa/lambda light chains.     02/26/2016 Treatment Plan Change   Transfer of  medical oncology care to [facility]   02/26/2016 Tumor Marker   M-spike NOT OBSERVED; normal IgG, IgM, IgA, & kappa/lambda light chains.    03/22/2016 Imaging   Skeletal survey imaging: IMPRESSION: Stable appearance of widespread myelomatous involvement of the skeleton. No progressive lesions are observed. Stable appearing partial compressions of thoracic vertebral bodies. Stable vertebra plana at T12.   05/29/2016 Tumor Marker   M-spike NOT OBSERVED; normal IgG, IgM, IgA, & kappa/lambda light chains.    12/02/2019 Genetic Testing   FISH analysis        INTERVAL HISTORY:   Steven [name] is a 62 y.o. male presenting to clinic today for follow up of multiple myeloma. He was last seen by me on 08/11/22.  Today, he states that he is doing well overall. His appetite  level is at 100%. His energy level is at 100%.  PAST MEDICAL HISTORY:   Past Medical History: Past Medical History:  Diagnosis Date   Multiple myeloma in remission (HCC) 11/26/2005    Surgical History: Past Surgical History:  Procedure Laterality Date   BACK SURGERY     CYSTOSCOPY/URETEROSCOPY/HOLMIUM LASER/STENT PLACEMENT Left 10/09/2020   Procedure: CYSTOSCOPY/LEFT RETROGRADE/URETEROSCOPY/HOLMIUM LASER/LEFT STENT PLACEMENT;  Surgeon: Jerilee Field, MD;  Location: [facility];  Service: Urology;  Laterality: Left;  ONLY NEEDS 60 MIN   porth cath      Social History: Social History   Socioeconomic History   Marital status: Married    Spouse name: Not on file   Number of children: Not on file   Years of education: Not on file   Highest education level: Not on file  Occupational History   Not on file  Tobacco Use   Smoking status: Never   Smokeless tobacco: Never  Vaping Use   Vaping Use: Never used  Substance and Sexual Activity   Alcohol use: No   Drug use: No   Sexual activity: Not on file  Other Topics Concern   Not on file  Social History Narrative   Not on file   Social Determinants of Health   Financial Resource Strain: Low Risk  (05/14/2020)   Overall Financial Resource Strain (CARDIA)    Difficulty of Paying Living Expenses: Not hard at all  Food Insecurity: No Food Insecurity (05/14/2020)   Hunger Vital Sign    Worried About Running Out of Food in the Last Year: Never true    Ran Out of Food in the Last Year: Never true  Transportation Needs: No Transportation Needs (05/14/2020)   PRAPARE - Administrator, Civil Service (Medical): No    Lack of Transportation (Non-Medical): No  Physical Activity: Inactive (05/14/2020)   Exercise Vital Sign    Days of Exercise per Week: 0 days    Minutes of Exercise per Session: 0 min  Stress: No Stress Concern Present (05/14/2020)   [employer] of Occupational Health -  Occupational Stress Questionnaire    Feeling of Stress : Not at all  Social Connections: Moderately Integrated (05/14/2020)   Social Connection and Isolation Panel [NHANES]    Frequency of Communication with Friends and Family: More than three times a week    Frequency of Social Gatherings with Friends and Family: Twice a week    Attends Religious Services: 1 to 4 times per year    Active Member of [employer] or Organizations: No    Attends Banker Meetings: Never    Marital Status: Married  Catering manager Violence: Not At  Risk (05/14/2020)   Humiliation, Afraid, Rape, and Kick questionnaire    Fear of Current or Ex-Partner: No    Emotionally Abused: No    Physically Abused: No    Sexually Abused: No    Family History: Family History  Problem Relation Age of Onset   Diabetes Mother    Cancer Sister     Current Medications:  Current Outpatient Medications:    ASPIRIN 81 PO, Take 81 mg by mouth daily., Disp: , Rfl:    CALCIUM PO, Take 400 mg by mouth daily., Disp: , Rfl:    chlorhexidine (PERIDEX) 0.12 % solution, USE AS DIRECTED 15 MLS IN THE MOUTH OR THROAT 2 (TWO) TIMES DAILY. (Patient taking differently: Use as directed 15 mLs in the mouth or throat daily.), Disp: 473 mL, Rfl: 2   dexamethasone (DECADRON) 4 MG tablet, TAKE 5 TABLETS (20 MG TOTAL) BY MOUTH ONCE A WEEK. TUESDAY, Disp: 80 tablet, Rfl: 4   folic acid (FOLVITE) 1 MG tablet, Take 1 mg by mouth daily., Disp: , Rfl:    KLOR-CON M20 20 MEQ tablet, TAKE 1 TABLET BY MOUTH EVERY DAY, Disp: 90 tablet, Rfl: 1   lenalidomide (REVLIMID) 15 MG capsule, TAKE 1 CAPSULE DAILY FOR 21 DAYS AND 7 DAYS OFF, Disp: 21 capsule, Rfl: 0   lidocaine-prilocaine (EMLA) cream, Apply 1 application topically as needed [facility])., Disp: , Rfl:    methadone (DOLOPHINE) 10 MG tablet, Take 1 tablet (10 mg total) by mouth every 12 (twelve) hours., Disp: 60 tablet, Rfl: 0   polyethylene glycol (MIRALAX / GLYCOLAX) packet, Take 17 g by mouth  daily as needed for mild constipation or moderate constipation., Disp: , Rfl:    Pramox-PE-Glycerin-Petrolatum 1-0.25-14.4-15 % CREA, Place 1 application rectally daily as needed (Irritation)., Disp: , Rfl:  No current facility-administered medications for this visit.  Facility-Administered Medications Ordered in Other Visits:    octreotide (SANDOSTATIN LAR) 30 MG IM injection, , , ,    sodium chloride flush (NS) 0.9 % injection 10 mL, 10 mL, Intravenous, PRN, Ellouise Newer, PA-C, 10 mL at 06/19/17 1029   Allergies: Allergies  Allergen Reactions   Contrast Media [Iodinated Contrast Media]     Burning sensation all over body    REVIEW OF SYSTEMS:   Review of Systems  Constitutional:  Negative for chills, fatigue and fever.  HENT:   Negative for lump/mass, mouth sores, nosebleeds, sore throat and trouble swallowing.   Eyes:  Negative for eye problems.  Respiratory:  Negative for cough and shortness of breath.   Cardiovascular:  Negative for chest pain, leg swelling and palpitations.  Gastrointestinal:  Negative for abdominal pain, constipation, diarrhea, nausea and vomiting.  Genitourinary:  Negative for bladder incontinence, difficulty urinating, dysuria, frequency, hematuria and nocturia.   Musculoskeletal:  Negative for arthralgias, back pain, flank pain, myalgias and neck pain.  Skin:  Negative for itching and rash.  Neurological:  Negative for dizziness, headaches and numbness.  Hematological:  Does not bruise/bleed easily.  Psychiatric/Behavioral:  Negative for depression, sleep disturbance and suicidal ideas. The patient is not nervous/anxious.   All other systems reviewed and are negative.    VITALS:   Blood pressure (!) 148/75, pulse (!) 57, temperature 98.5 F (36.9 C), temperature source Oral, resp. rate 18, weight 164 lb (74.4 kg), SpO2 100 %.  Wt Readings from Last 3 Encounters:  11/20/22 164 lb (74.4 kg)  08/11/22 165 lb (74.8 kg)  04/30/22 163 lb 3.2 oz (74  kg)  Body mass index is 29.06 kg/m.  Performance status (ECOG): 1 - Symptomatic but completely ambulatory  PHYSICAL EXAM:   Physical Exam Vitals and nursing note reviewed. Exam conducted with a chaperone present.  Constitutional:      Appearance: Normal appearance.  Cardiovascular:     Rate and Rhythm: Normal rate and regular rhythm.     Pulses: Normal pulses.     Heart sounds: Normal heart sounds.  Pulmonary:     Effort: Pulmonary effort is normal.     Breath sounds: Normal breath sounds.  Abdominal:     Palpations: Abdomen is soft. There is no hepatomegaly, splenomegaly or mass.     Tenderness: There is no abdominal tenderness.  Musculoskeletal:     Right lower leg: No edema.     Left lower leg: No edema.  Lymphadenopathy:     Cervical: No cervical adenopathy.     Right cervical: No superficial, deep or posterior cervical adenopathy.    Left cervical: No superficial, deep or posterior cervical adenopathy.     Upper Body:     Right upper body: No supraclavicular or axillary adenopathy.     Left upper body: No supraclavicular or axillary adenopathy.  Neurological:     General: No focal deficit present.     Mental Status: He is alert and oriented to person, place, and time.  Psychiatric:        Mood and Affect: Mood normal.        Behavior: Behavior normal.     LABS:      Latest Ref Rng & Units 11/13/2022    2:46 PM 08/04/2022    9:08 AM 04/24/2022    9:16 AM  CBC  WBC 4.0 - 10.5 K/uL 3.6  3.3  4.3   Hemoglobin 13.0 - 17.0 g/dL 43.3  29.5  18.8   Hematocrit 39.0 - 52.0 % 36.9  37.9  37.4   Platelets 150 - 400 K/uL 107  101  110       Latest Ref Rng & Units 11/13/2022    2:46 PM 08/04/2022    9:08 AM 04/24/2022    9:16 AM  CMP  Glucose 70 - 99 mg/dL 95  416  94   BUN 8 - 23 mg/dL 14  10  24    Creatinine 0.61 - 1.24 mg/dL 6.06  3.01  6.01   Sodium 135 - 145 mmol/L 137  140  145   Potassium 3.5 - 5.1 mmol/L 3.4  3.5  3.5   Chloride 98 - 111 mmol/L 104  106   113   CO2 22 - 32 mmol/L 25  24  26    Calcium 8.9 - 10.3 mg/dL 8.3  8.7  8.8   Total Protein 6.5 - 8.1 g/dL 6.3  6.2  6.2   Total Bilirubin 0.3 - 1.2 mg/dL 1.2  1.3  1.3   Alkaline Phos 38 - 126 U/L 32  35  31   AST 15 - 41 U/L 18  18  16    ALT 0 - 44 U/L 17  13  10       No results found for: "CEA1", "CEA" / No results found for: "CEA1", "CEA" No results found for: "PSA1" No results found for: "UXN235" No results found for: "CAN125"  Lab Results  Component Value Date   TOTALPROTELP 5.7 (L) 11/13/2022   ALBUMINELP 3.4 11/13/2022   A1GS 0.2 11/13/2022   A2GS 0.5 11/13/2022   BETS 1.0 11/13/2022   GAMS 0.7 11/13/2022  MSPIKE Not Observed 11/13/2022   SPEI Comment 11/13/2022   Lab Results  Component Value Date   TIBC 365 08/04/2022   TIBC 333 02/26/2016   FERRITIN 78 08/04/2022   FERRITIN 80 02/26/2016   IRONPCTSAT 23 08/04/2022   IRONPCTSAT 27 02/26/2016   Lab Results  Component Value Date   LDH 122 10/10/2021   LDH 115 04/01/2021   LDH 145 09/18/2020     STUDIES:   No results found.

## 2022-11-20 ENCOUNTER — Encounter: Payer: Self-pay | Admitting: Hematology

## 2022-11-20 ENCOUNTER — Inpatient Hospital Stay: Payer: Medicare Other | Attending: Hematology | Admitting: Hematology

## 2022-11-20 VITALS — BP 148/75 | HR 57 | Temp 98.5°F | Resp 18 | Wt 164.0 lb

## 2022-11-20 DIAGNOSIS — C9002 Multiple myeloma in relapse: Secondary | ICD-10-CM | POA: Insufficient documentation

## 2022-11-20 DIAGNOSIS — D509 Iron deficiency anemia, unspecified: Secondary | ICD-10-CM | POA: Insufficient documentation

## 2022-11-20 DIAGNOSIS — C9001 Multiple myeloma in remission: Secondary | ICD-10-CM | POA: Diagnosis not present

## 2022-11-20 DIAGNOSIS — D696 Thrombocytopenia, unspecified: Secondary | ICD-10-CM | POA: Insufficient documentation

## 2022-11-20 NOTE — Patient Instructions (Signed)
[facility]  Discharge Instructions  You were seen and examined today by Dr. Katragadda.  Dr. Katragadda discussed your most recent lab work which revealed that everything looks good and stable.  Follow-up as scheduled in 3 months.    Thank you for choosing  [facility] [place] to provide your oncology and hematology care.   To afford each patient quality time with our provider, please arrive at least 15 minutes before your scheduled appointment time. You may need to reschedule your appointment if you arrive late (10 or more minutes). Arriving late affects you and other patients whose appointments are after yours.  Also, if you miss three or more appointments without notifying the office, you may be dismissed from the clinic at the provider's discretion.    Again, thank you for choosing [facility].  Our hope is that these requests will decrease the amount of time that you wait before being seen by our physicians.   If you have a lab appointment with the Cancer Center - please note that after April 8th, all labs will be drawn in the cancer center.  You do not have to check in or register with the main entrance as you have in the past but will complete your check-in at the cancer center.            _____________________________________________________________  Should you have questions after your visit to [facility], please contact our office at [phone] and follow the prompts.  Our office hours are 8:00 a.m. to 4:30 p.m. Monday - Thursday and 8:00 a.m. to 2:30 p.m. Friday.  Please note that voicemails left after 4:00 p.m. may not be returned until the following business day.  We are closed weekends and all major holidays.  You do have access to a nurse 24-7, just call the main number to the clinic [phone] and do not press any options, hold on the line and a nurse will answer the phone.    For prescription  refill requests, have your pharmacy contact our office and allow 72 hours.    Masks are no longer required in the cancer centers. If you would like for your care team to wear a mask while they are taking care of you, please let them know. You may have one support person who is at least 62 years old accompany you for your appointments.  

## 2022-12-03 ENCOUNTER — Other Ambulatory Visit: Payer: Self-pay

## 2022-12-03 DIAGNOSIS — G8929 Other chronic pain: Secondary | ICD-10-CM

## 2022-12-03 DIAGNOSIS — C9002 Multiple myeloma in relapse: Secondary | ICD-10-CM

## 2022-12-03 DIAGNOSIS — C9001 Multiple myeloma in remission: Secondary | ICD-10-CM

## 2022-12-03 MED ORDER — LENALIDOMIDE 15 MG PO CAPS
ORAL_CAPSULE | ORAL | 0 refills | Status: DC
Start: 2022-12-03 — End: 2022-12-30

## 2022-12-03 NOTE — Telephone Encounter (Signed)
Chart reviewed. Revlimid refilled per last office note with Dr. Katragadda.  

## 2022-12-04 ENCOUNTER — Other Ambulatory Visit: Payer: Self-pay

## 2022-12-04 DIAGNOSIS — G8929 Other chronic pain: Secondary | ICD-10-CM

## 2022-12-04 DIAGNOSIS — C9001 Multiple myeloma in remission: Secondary | ICD-10-CM

## 2022-12-04 MED ORDER — METHADONE HCL 10 MG PO TABS
10.0000 mg | ORAL_TABLET | Freq: Two times a day (BID) | ORAL | 0 refills | Status: DC
Start: 2022-12-04 — End: 2022-12-29

## 2022-12-29 ENCOUNTER — Other Ambulatory Visit: Payer: Self-pay | Admitting: *Deleted

## 2022-12-29 DIAGNOSIS — C9001 Multiple myeloma in remission: Secondary | ICD-10-CM

## 2022-12-29 DIAGNOSIS — G8929 Other chronic pain: Secondary | ICD-10-CM

## 2022-12-29 MED ORDER — METHADONE HCL 10 MG PO TABS
10.0000 mg | ORAL_TABLET | Freq: Two times a day (BID) | ORAL | 0 refills | Status: DC
Start: 2022-12-29 — End: 2023-02-04

## 2022-12-30 ENCOUNTER — Other Ambulatory Visit: Payer: Self-pay | Admitting: Hematology

## 2022-12-30 DIAGNOSIS — C9002 Multiple myeloma in relapse: Secondary | ICD-10-CM

## 2022-12-30 NOTE — Telephone Encounter (Signed)
Chart reviewed. Revlimid refilled per last office note with Dr. Katragadda.  

## 2023-02-02 ENCOUNTER — Other Ambulatory Visit: Payer: Self-pay | Admitting: Hematology

## 2023-02-02 DIAGNOSIS — C9002 Multiple myeloma in relapse: Secondary | ICD-10-CM

## 2023-02-02 NOTE — Telephone Encounter (Signed)
Chart reviewed. Revlimid refilled per last office note with Dr. Katragadda.  

## 2023-02-04 ENCOUNTER — Other Ambulatory Visit: Payer: Self-pay | Admitting: *Deleted

## 2023-02-04 DIAGNOSIS — C9001 Multiple myeloma in remission: Secondary | ICD-10-CM

## 2023-02-04 DIAGNOSIS — G8929 Other chronic pain: Secondary | ICD-10-CM

## 2023-02-04 MED ORDER — METHADONE HCL 10 MG PO TABS
10.0000 mg | ORAL_TABLET | Freq: Two times a day (BID) | ORAL | 0 refills | Status: DC
Start: 2023-02-04 — End: 2023-03-31

## 2023-02-24 ENCOUNTER — Inpatient Hospital Stay: Payer: Medicare Other | Attending: Hematology

## 2023-02-24 ENCOUNTER — Other Ambulatory Visit: Payer: Medicare Other

## 2023-02-24 DIAGNOSIS — Z7952 Long term (current) use of systemic steroids: Secondary | ICD-10-CM | POA: Diagnosis not present

## 2023-02-24 DIAGNOSIS — G8929 Other chronic pain: Secondary | ICD-10-CM | POA: Insufficient documentation

## 2023-02-24 DIAGNOSIS — Z9484 Stem cells transplant status: Secondary | ICD-10-CM | POA: Insufficient documentation

## 2023-02-24 DIAGNOSIS — C9002 Multiple myeloma in relapse: Secondary | ICD-10-CM | POA: Diagnosis present

## 2023-02-24 DIAGNOSIS — C9001 Multiple myeloma in remission: Secondary | ICD-10-CM

## 2023-02-24 DIAGNOSIS — Z9481 Bone marrow transplant status: Secondary | ICD-10-CM | POA: Insufficient documentation

## 2023-02-24 DIAGNOSIS — D509 Iron deficiency anemia, unspecified: Secondary | ICD-10-CM | POA: Insufficient documentation

## 2023-02-24 DIAGNOSIS — Z79899 Other long term (current) drug therapy: Secondary | ICD-10-CM | POA: Insufficient documentation

## 2023-02-24 DIAGNOSIS — Z7982 Long term (current) use of aspirin: Secondary | ICD-10-CM | POA: Diagnosis not present

## 2023-02-24 DIAGNOSIS — Z7961 Long term (current) use of immunomodulator: Secondary | ICD-10-CM | POA: Insufficient documentation

## 2023-02-24 DIAGNOSIS — D696 Thrombocytopenia, unspecified: Secondary | ICD-10-CM | POA: Insufficient documentation

## 2023-02-24 LAB — COMPREHENSIVE METABOLIC PANEL
ALT: 15 U/L (ref 0–44)
AST: 16 U/L (ref 15–41)
Albumin: 3.9 g/dL (ref 3.5–5.0)
Alkaline Phosphatase: 38 U/L (ref 38–126)
Anion gap: 7 (ref 5–15)
BUN: 13 mg/dL (ref 8–23)
CO2: 23 mmol/L (ref 22–32)
Calcium: 9 mg/dL (ref 8.9–10.3)
Chloride: 110 mmol/L (ref 98–111)
Creatinine, Ser: 0.9 mg/dL (ref 0.61–1.24)
GFR, Estimated: >60 mL/min (ref >60)
Glucose, Bld: 113 mg/dL — ABNORMAL HIGH (ref 70–99)
Potassium: 3.6 mmol/L (ref 3.5–5.1)
Sodium: 140 mmol/L (ref 135–145)
Total Bilirubin: 1.9 mg/dL — ABNORMAL HIGH (ref 0.3–1.2)
Total Protein: 6.8 g/dL (ref 6.5–8.1)

## 2023-02-24 LAB — CBC WITH DIFFERENTIAL/PLATELET
Abs Immature Granulocytes: 0.02 10*3/uL (ref 0.00–0.07)
Basophils Absolute: 0 10*3/uL (ref 0.0–0.1)
Basophils Relative: 1 %
Eosinophils Absolute: 0 10*3/uL (ref 0.0–0.5)
Eosinophils Relative: 0 %
HCT: 42.4 % (ref 39.0–52.0)
Hemoglobin: 12.9 g/dL — ABNORMAL LOW (ref 13.0–17.0)
Immature Granulocytes: 1 %
Lymphocytes Relative: 18 %
Lymphs Abs: 0.7 10*3/uL (ref 0.7–4.0)
MCH: 24.6 pg — ABNORMAL LOW (ref 26.0–34.0)
MCHC: 30.4 g/dL (ref 30.0–36.0)
MCV: 80.9 fL (ref 80.0–100.0)
Monocytes Absolute: 0.1 10*3/uL (ref 0.1–1.0)
Monocytes Relative: 3 %
Neutro Abs: 3.1 10*3/uL (ref 1.7–7.7)
Neutrophils Relative %: 77 %
Platelets: 96 10*3/uL — ABNORMAL LOW (ref 150–400)
RBC: 5.24 MIL/uL (ref 4.22–5.81)
RDW: 18.9 % — ABNORMAL HIGH (ref 11.5–15.5)
WBC: 3.9 10*3/uL — ABNORMAL LOW (ref 4.0–10.5)
nRBC: 0 % (ref 0.0–0.2)

## 2023-02-24 LAB — FERRITIN: Ferritin: 91 ng/mL (ref 24–336)

## 2023-02-24 LAB — IRON AND TIBC
Iron: 146 ug/dL (ref 45–182)
Saturation Ratios: 36 % (ref 17.9–39.5)
TIBC: 408 ug/dL (ref 250–450)
UIBC: 262 ug/dL

## 2023-02-26 LAB — KAPPA/LAMBDA LIGHT CHAINS
Kappa free light chain: 14.9 mg/L (ref 3.3–19.4)
Kappa, lambda light chain ratio: 1.62 (ref 0.26–1.65)
Lambda free light chains: 9.2 mg/L (ref 5.7–26.3)

## 2023-02-27 LAB — PROTEIN ELECTROPHORESIS, SERUM
A/G Ratio: 1.3 (ref 0.7–1.7)
Albumin ELP: 3.6 g/dL (ref 2.9–4.4)
Alpha-1-Globulin: 0.3 g/dL (ref 0.0–0.4)
Alpha-2-Globulin: 0.6 g/dL (ref 0.4–1.0)
Beta Globulin: 1.2 g/dL (ref 0.7–1.3)
Gamma Globulin: 0.8 g/dL (ref 0.4–1.8)
Globulin, Total: 2.8 g/dL (ref 2.2–3.9)
Total Protein ELP: 6.4 g/dL (ref 6.0–8.5)

## 2023-03-03 ENCOUNTER — Inpatient Hospital Stay (HOSPITAL_BASED_OUTPATIENT_CLINIC_OR_DEPARTMENT_OTHER): Payer: Medicare Other | Admitting: Hematology

## 2023-03-03 VITALS — BP 146/86 | HR 76 | Temp 98.5°F | Resp 18 | Wt 166.1 lb

## 2023-03-03 DIAGNOSIS — C9001 Multiple myeloma in remission: Secondary | ICD-10-CM | POA: Diagnosis not present

## 2023-03-03 DIAGNOSIS — C9002 Multiple myeloma in relapse: Secondary | ICD-10-CM | POA: Diagnosis not present

## 2023-03-03 NOTE — Progress Notes (Signed)
[facility] [address], [place] [address]    Clinic Day:  03/03/2023  Referring physician: Kirstie Peri, MD  Patient Care Team: Kirstie Peri, MD as PCP - General (Internal Medicine)   ASSESSMENT & PLAN:   Assessment: 1.  Relapsed kappa light chain multiple myeloma: -Diagnosis on 06/05/2003, presentation with T12 vertebral body fracture, kappa light chain myeloma diagnosed with 2.03 g of light chains/24-hour urine. -06/09/2003 -06/22/2003 radiation 19 Gray total T11-L1, 19 grade T2-T4 for T3 vertebral body involvement. -06/26/2003 chemotherapy with Doxil, Velcade and dexamethasone. 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 -06/22/2008 relapse of myeloma with kappa light chains and 24-hour urine found, treatment initiated with Revlimid/dexamethasone from 07/31/2008 through 08/10/2013. -Radiation therapy 25 centigrade to right hip for impending right femoral neck fracture from 08/27/2010 through 09/10/2010. -PET scan on 09/12/2019 did not show any myeloma lesions. -SPEP and immunofixation on 09/12/2019 was normal.  Kappa light chains have increased to 48.  Ratio has gone up to 4.32 and steadily increasing. -Creatinine and calcium were normal.  LDH was normal. -24-hour urine shows no M spike.  Immunofixation was normal.  Elevated light chain ratio present. -Bone marrow biopsy on 12/02/2019 shows normocellular marrow with plasma cells ranging from 10-20%.  Chromosome analysis was normal.  FISH panel was negative for high-risk abnormalities. -Skeletal survey on 01/02/2020 shows possible new or better demonstrated lucencies in the T5 and T6 vertebral bodies. -Labs on 01/02/2020 shows kappa light chains 118.7, ratio of 11.2, uptrending.  SPEP is negative. -Revlimid 15 mg 3 weeks on/1 week off with weekly dexamethasone 20 mg started on  01/30/2020. - He had Zometa in the past at [facility].  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    Plan: 1.  Relapsed multiple myeloma: - He is tolerating Revlimid without any major problems. - Reviewed multiple myeloma labs from 02/24/2023: M spike is not detected.  Free light chain ratio is normal at 1.62.  Calcium and creatinine were normal.  Hemoglobin at baseline of 12.9. - Continue Revlimid 10 mg 3 weeks on/1 week off regimen.  Continue dexamethasone 16 mg every week. - He plans to have dental extractions and root canal treatment around 03/16/2023. - RTC 3 months for follow-up with repeat labs.   2.  Chronic mid back pain: - Continue methadone 10 mg every 12 hours.  Pain is well-controlled.  He tried to stop taking methadone but developed sharp pains in his shoulders.   3.  Mild thrombocytopenia: - Mild thrombocytopenia since 2017 is stable.  Platelet count is 96,000.  No bleeding issues reported.   4.  Thromboprophylaxis: - Continue aspirin 81 mg for thromboprophylaxis.  5.  Microcytic anemia: - Ferritin is 91 and percent saturation 36.  Hemoglobin has improved to 12.9.    Orders Placed This Encounter  Procedures   CBC with Differential/Platelet    Standing Status:   Future    Standing Expiration Date:   03/02/2024    Order Specific Question:   Release to patient    Answer:   Immediate   Comprehensive metabolic panel    Standing Status:   Future    Standing Expiration Date:   03/02/2024    Order Specific Question:   Release to patient    Answer:   Immediate   Protein electrophoresis, serum    Standing Status:   Future  Standing Expiration Date:   03/02/2024    Order Specific Question:   Release to patient    Answer:   Immediate   Immunofixation electrophoresis    Standing Status:   Future    Standing Expiration Date:   03/02/2024    Order Specific Question:   Release to patient    Answer:   Immediate   Kappa/lambda light chains    Standing Status:   Future     Standing Expiration Date:   03/02/2024      Alben Deeds Teague,acting as a scribe for Doreatha Massed, MD.,have documented all relevant documentation on the behalf of Doreatha Massed, MD,as directed by  Doreatha Massed, MD while in the presence of Doreatha Massed, MD.  I, Doreatha Massed MD, have reviewed the above documentation for accuracy and completeness, and I agree with the above.    Doreatha Massed, MD   9/17/20243:52 PM  CHIEF COMPLAINT:   Diagnosis: multiple myeloma    Cancer Staging  No matching staging information was found for the patient.    Prior Therapy: 1. Auto stem cell transplant at [facility] in 2006. 2. Revlimid maintenance until 2015.  Current Therapy:  Revlimid 3/4 weeks; Decadron weekly    HISTORY OF PRESENT ILLNESS:   Oncology History  Multiple myeloma in remission (HCC)  06/05/2003 Initial Diagnosis   Myeloma, presenting with excruciating back pain and T12 vertebral body fracture, kappa light chain multiple myeloma diagnosed with 2030 mg light chains per 24 hours and urine   06/09/2003 - 06/22/2003 Radiation Therapy   19 Gray to T11-L1 or T12 vertebral body fracture. 19 Gray T2-T4 for T3 vertebral body involvement   06/26/2003 - 07/25/2004 Chemotherapy   Doxil, Velcade, and dexamethasone    02/27/2004 Bone Marrow Biopsy   Normal cellular bone marrow showing trilineage hematopoiesis with less than 10% plasma cells.   07/25/2004 Bone Marrow Transplant   Transplant after high-dose chemotherapy with melphalan 200 mg/m, but no maintenance therapy was given, transplant at [facility].   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  Followed by [facility] Department of dentistry.   06/22/2008 Progression   Relapse with The light chains in 24-hour urine found, therapy reinitiated with Revlimid/dexamethasone.   07/31/2008 - 08/10/2013 Chemotherapy   Revlimid/dexamethasone initiated for recurrent disease.    08/27/2010 -  09/10/2010 Radiation Therapy   2500 cGy to right hip for impending right femoral neck fracture   08/04/2013 Imaging   Bone density-normal with T score of -1.0.   01/19/2014 Imaging   MRI pelvis-partially visualized right L4-L5 and L5-S1 facet arthritis with periarticular edema. This can be associated with referred pain to the right hip. Small abnormal marrow signal foci in the proximal right femur are likely representing treated myeloma. There is no cortical erosion, stress reaction or stress fracture. Similar lesions are present in the right supra-acetabular ileum.   02/08/2014 Imaging   Bone survey-no significant change in widespread myelomatous involvement of skeleton. Multiple pathologic fractures in the thoracic spine at L1 appears stable. No interval pathologic fracture identified.   12/26/2015 Tumor Marker   M-spike NOT OBSERVED; normal IgG, IgM, IgA, & kappa/lambda light chains.     02/26/2016 Treatment Plan Change   Transfer of medical oncology care to [facility]   02/26/2016 Tumor Marker   M-spike NOT OBSERVED; normal IgG, IgM, IgA, & kappa/lambda light chains.    03/22/2016 Imaging   Skeletal survey imaging: IMPRESSION: Stable appearance of widespread myelomatous involvement of the  skeleton. No progressive lesions are observed. Stable appearing partial compressions of thoracic vertebral bodies. Stable vertebra plana at T12.   05/29/2016 Tumor Marker   M-spike NOT OBSERVED; normal IgG, IgM, IgA, & kappa/lambda light chains.    12/02/2019 Genetic Testing   FISH analysis        INTERVAL HISTORY:   Steven [name] is a 62 y.o. male presenting to clinic today for follow up of multiple myeloma. He was last seen by me on 11/20/22.  Today, he states that he is doing well overall. His appetite level is at 100%. His energy level is at 100%. He is accompanied by his wife.   His pain remains stable. He denies any side effects from Revlimid and is taking it as prescribed. He takes 4  dexamethasone pills a week. He reports he had one mouth ulcer. He did not take pain pills for 2 weeks and notes the only difference he felt was "stickiness" and mild pain in the joints.  He is taking 81 mg ASA. He had mouth cleaning done since his last visit and has a root canal scheduled on 03/16/23. He also will have 2 teeth extractions in the future.   PAST MEDICAL HISTORY:   Past Medical History: Past Medical History:  Diagnosis Date   Multiple myeloma in remission (HCC) 11/26/2005    Surgical History: Past Surgical History:  Procedure Laterality Date   BACK SURGERY     CYSTOSCOPY/URETEROSCOPY/HOLMIUM LASER/STENT PLACEMENT Left 10/09/2020   Procedure: CYSTOSCOPY/LEFT RETROGRADE/URETEROSCOPY/HOLMIUM LASER/LEFT STENT PLACEMENT;  Surgeon: Jerilee Field, MD;  [facility];  Service: Urology;  Laterality: Left;  ONLY NEEDS 60 MIN   porth cath      Social History: Social History   Socioeconomic History   Marital status: Married    Spouse name: Not on file   Number of children: Not on file   Years of education: Not on file   Highest education level: Not on file  Occupational History   Not on file  Tobacco Use   Smoking status: Never   Smokeless tobacco: Never  Vaping Use   Vaping status: Never Used  Substance and Sexual Activity   Alcohol use: No   Drug use: No   Sexual activity: Not on file  Other Topics Concern   Not on file  Social History Narrative   Not on file   Social Determinants of Health   Financial Resource Strain: Low Risk  (05/14/2020)   Overall Financial Resource Strain (CARDIA)    Difficulty of Paying Living Expenses: Not hard at all  Food Insecurity: No Food Insecurity (05/14/2020)   Hunger Vital Sign    Worried About Running Out of Food in the Last Year: Never true    Ran Out of Food in the Last Year: Never true  Transportation Needs: No Transportation Needs (05/14/2020)   PRAPARE - Administrator, Civil Service  (Medical): No    Lack of Transportation (Non-Medical): No  Physical Activity: Inactive (05/14/2020)   Exercise Vital Sign    Days of Exercise per Week: 0 days    Minutes of Exercise per Session: 0 min  Stress: No Stress Concern Present (05/14/2020)   [employer] of Occupational Health - Occupational Stress Questionnaire    Feeling of Stress : Not at all  Social Connections: Moderately Integrated (05/14/2020)   Social Connection and Isolation Panel [NHANES]    Frequency of Communication with Friends and Family: More than three times a week    Frequency  of Social Gatherings with Friends and Family: Twice a week    Attends Religious Services: 1 to 4 times per year    Active Member of [employer] or Organizations: No    Attends Banker Meetings: Never    Marital Status: Married  Catering manager Violence: Not At Risk (05/14/2020)   Humiliation, Afraid, Rape, and Kick questionnaire    Fear of Current or Ex-Partner: No    Emotionally Abused: No    Physically Abused: No    Sexually Abused: No    Family History: Family History  Problem Relation Age of Onset   Diabetes Mother    Cancer Sister     Current Medications:  Current Outpatient Medications:    ASPIRIN 81 PO, Take 81 mg by mouth daily., Disp: , Rfl:    CALCIUM PO, Take 400 mg by mouth daily., Disp: , Rfl:    chlorhexidine (PERIDEX) 0.12 % solution, USE AS DIRECTED 15 MLS IN THE MOUTH OR THROAT 2 (TWO) TIMES DAILY. (Patient taking differently: Use as directed 15 mLs in the mouth or throat daily.), Disp: 473 mL, Rfl: 2   dexamethasone (DECADRON) 4 MG tablet, TAKE 5 TABLETS (20 MG TOTAL) BY MOUTH ONCE A WEEK. TUESDAY, Disp: 80 tablet, Rfl: 4   folic acid (FOLVITE) 1 MG tablet, Take 1 mg by mouth daily., Disp: , Rfl:    KLOR-CON M20 20 MEQ tablet, TAKE 1 TABLET BY MOUTH EVERY DAY, Disp: 90 tablet, Rfl: 1   lenalidomide (REVLIMID) 15 MG capsule, TAKE 1 CAPSULE DAILY FOR 21 DAYS AND 7 DAYS OFF., Disp: 21 capsule, Rfl:  0   lidocaine-prilocaine (EMLA) cream, Apply 1 application topically as needed [facility])., Disp: , Rfl:    methadone (DOLOPHINE) 10 MG tablet, Take 1 tablet (10 mg total) by mouth every 12 (twelve) hours., Disp: 60 tablet, Rfl: 0   polyethylene glycol (MIRALAX / GLYCOLAX) packet, Take 17 g by mouth daily as needed for mild constipation or moderate constipation., Disp: , Rfl:    Pramox-PE-Glycerin-Petrolatum 1-0.25-14.4-15 % CREA, Place 1 application rectally daily as needed (Irritation)., Disp: , Rfl:  No current facility-administered medications for this visit.  Facility-Administered Medications Ordered in Other Visits:    octreotide (SANDOSTATIN LAR) 30 MG IM injection, , , ,    sodium chloride flush (NS) 0.9 % injection 10 mL, 10 mL, Intravenous, PRN, Ellouise Newer, PA-C, 10 mL at 06/19/17 1029   Allergies: Allergies  Allergen Reactions   Contrast Media [Iodinated Contrast Media]     Burning sensation all over body    REVIEW OF SYSTEMS:   Review of Systems  Constitutional:  Negative for chills, fatigue and fever.  HENT:   Negative for lump/mass, mouth sores, nosebleeds, sore throat and trouble swallowing.   Eyes:  Negative for eye problems.  Respiratory:  Negative for cough and shortness of breath.   Cardiovascular:  Negative for chest pain, leg swelling and palpitations.  Gastrointestinal:  Negative for abdominal pain, constipation, diarrhea, nausea and vomiting.  Genitourinary:  Negative for bladder incontinence, difficulty urinating, dysuria, frequency, hematuria and nocturia.   Musculoskeletal:  Negative for arthralgias, back pain, flank pain, myalgias and neck pain.  Skin:  Negative for itching and rash.  Neurological:  Positive for numbness. Negative for dizziness and headaches.  Hematological:  Does not bruise/bleed easily.  Psychiatric/Behavioral:  Negative for depression, sleep disturbance and suicidal ideas. Decreased concentration: tingling feet.The patient is not  nervous/anxious.   All other systems reviewed and are negative.  VITALS:   Blood pressure (!) 146/86, pulse 76, temperature 98.5 F (36.9 C), temperature source Oral, resp. rate 18, weight 166 lb 1.6 oz (75.3 kg), SpO2 97%.  Wt Readings from Last 3 Encounters:  03/03/23 166 lb 1.6 oz (75.3 kg)  11/20/22 164 lb (74.4 kg)  08/11/22 165 lb (74.8 kg)    Body mass index is 29.43 kg/m.  Performance status (ECOG): 1 - Symptomatic but completely ambulatory  PHYSICAL EXAM:   Physical Exam Vitals and nursing note reviewed. Exam conducted with a chaperone present.  Constitutional:      Appearance: Normal appearance.  Cardiovascular:     Rate and Rhythm: Normal rate and regular rhythm.     Pulses: Normal pulses.     Heart sounds: Normal heart sounds.  Pulmonary:     Effort: Pulmonary effort is normal.     Breath sounds: Normal breath sounds.  Abdominal:     Palpations: Abdomen is soft. There is no hepatomegaly, splenomegaly or mass.     Tenderness: There is no abdominal tenderness.  Musculoskeletal:     Right lower leg: No edema.     Left lower leg: No edema.  Lymphadenopathy:     Cervical: No cervical adenopathy.     Right cervical: No superficial, deep or posterior cervical adenopathy.    Left cervical: No superficial, deep or posterior cervical adenopathy.     Upper Body:     Right upper body: No supraclavicular or axillary adenopathy.     Left upper body: No supraclavicular or axillary adenopathy.  Neurological:     General: No focal deficit present.     Mental Status: He is alert and oriented to person, place, and time.  Psychiatric:        Mood and Affect: Mood normal.        Behavior: Behavior normal.     LABS:      Latest Ref Rng & Units 02/24/2023    9:32 AM 11/13/2022    2:46 PM 08/04/2022    9:08 AM  CBC  WBC 4.0 - 10.5 K/uL 3.9  3.6  3.3   Hemoglobin 13.0 - 17.0 g/dL 16.1  09.6  04.5   Hematocrit 39.0 - 52.0 % 42.4  36.9  37.9   Platelets 150 - 400 K/uL  96  107  101       Latest Ref Rng & Units 02/24/2023    9:32 AM 11/13/2022    2:46 PM 08/04/2022    9:08 AM  CMP  Glucose 70 - 99 mg/dL 409  95  811   BUN 8 - 23 mg/dL 13  14  10    Creatinine 0.61 - 1.24 mg/dL 9.14  7.82  9.56   Sodium 135 - 145 mmol/L 140  137  140   Potassium 3.5 - 5.1 mmol/L 3.6  3.4  3.5   Chloride 98 - 111 mmol/L 110  104  106   CO2 22 - 32 mmol/L 23  25  24    Calcium 8.9 - 10.3 mg/dL 9.0  8.3  8.7   Total Protein 6.5 - 8.1 g/dL 6.8  6.3  6.2   Total Bilirubin 0.3 - 1.2 mg/dL 1.9  1.2  1.3   Alkaline Phos 38 - 126 U/L 38  32  35   AST 15 - 41 U/L 16  18  18    ALT 0 - 44 U/L 15  17  13       No results found for: "CEA1", "CEA" /  No results found for: "CEA1", "CEA" No results found for: "PSA1" No results found for: "CAN199" No results found for: "CAN125"  Lab Results  Component Value Date   TOTALPROTELP 6.4 02/24/2023   ALBUMINELP 3.6 02/24/2023   A1GS 0.3 02/24/2023   A2GS 0.6 02/24/2023   BETS 1.2 02/24/2023   GAMS 0.8 02/24/2023   MSPIKE Not Observed 02/24/2023   SPEI Comment 02/24/2023   Lab Results  Component Value Date   TIBC 408 02/24/2023   TIBC 365 08/04/2022   TIBC 333 02/26/2016   FERRITIN 91 02/24/2023   FERRITIN 78 08/04/2022   FERRITIN 80 02/26/2016   IRONPCTSAT 36 02/24/2023   IRONPCTSAT 23 08/04/2022   IRONPCTSAT 27 02/26/2016   Lab Results  Component Value Date   LDH 122 10/10/2021   LDH 115 04/01/2021   LDH 145 09/18/2020     STUDIES:   No results found.

## 2023-03-03 NOTE — Patient Instructions (Signed)
[facility]  Discharge Instructions  You were seen and examined today by Dr. Ellin Saba.  Dr. Ellin Saba discussed your most recent lab work which revealed that everything looks good and stable.  Continue taking the Dexamethasone and Revlimid as prescribed.  Follow-up as scheduled in 3 months.    Thank you for choosing [facility] to provide your oncology and hematology care.   To afford each patient quality time with our provider, please arrive at least 15 minutes before your scheduled appointment time. You may need to reschedule your appointment if you arrive late (10 or more minutes). Arriving late affects you and other patients whose appointments are after yours.  Also, if you miss three or more appointments without notifying the office, you may be dismissed from the clinic at the provider's discretion.    Again, thank you for choosing [facility].  Our hope is that these requests will decrease the amount of time that you wait before being seen by our physicians.   If you have a lab appointment with the Cancer Center - please note that after April 8th, all labs will be drawn in the cancer center.  You do not have to check in or register with the main entrance as you have in the past but will complete your check-in at the cancer center.            _____________________________________________________________  Should you have questions after your visit to [facility], please contact our office at [phone] and follow the prompts.  Our office hours are 8:00 a.m. to 4:30 p.m. Monday - Thursday and 8:00 a.m. to 2:30 p.m. Friday.  Please note that voicemails left after 4:00 p.m. may not be returned until the following business day.  We are closed weekends and all major holidays.  You do have access to a nurse 24-7, just call the main number to the clinic [phone] and do not press any options, hold on the  line and a nurse will answer the phone.    For prescription refill requests, have your pharmacy contact our office and allow 72 hours.    Masks are no longer required in the cancer centers. If you would like for your care team to wear a mask while they are taking care of you, please let them know. You may have one support person who is at least 62 years old accompany you for your appointments.

## 2023-03-04 ENCOUNTER — Other Ambulatory Visit: Payer: Self-pay | Admitting: Hematology

## 2023-03-04 DIAGNOSIS — C9002 Multiple myeloma in relapse: Secondary | ICD-10-CM

## 2023-03-04 NOTE — Telephone Encounter (Signed)
Chart reviewed. Revlimid refilled per last office note with Dr. Katragadda.  

## 2023-03-31 ENCOUNTER — Other Ambulatory Visit: Payer: Self-pay | Admitting: *Deleted

## 2023-03-31 DIAGNOSIS — G8929 Other chronic pain: Secondary | ICD-10-CM

## 2023-03-31 DIAGNOSIS — C9001 Multiple myeloma in remission: Secondary | ICD-10-CM

## 2023-03-31 MED ORDER — METHADONE HCL 10 MG PO TABS: 10.0000 mg | ORAL_TABLET | Freq: Two times a day (BID) | ORAL | 0 refills | Status: DC

## 2023-04-01 ENCOUNTER — Other Ambulatory Visit: Payer: Self-pay | Admitting: Hematology

## 2023-04-01 DIAGNOSIS — C9002 Multiple myeloma in relapse: Secondary | ICD-10-CM

## 2023-04-03 ENCOUNTER — Other Ambulatory Visit: Payer: Self-pay

## 2023-04-03 DIAGNOSIS — C9002 Multiple myeloma in relapse: Secondary | ICD-10-CM

## 2023-04-03 MED ORDER — LENALIDOMIDE 15 MG PO CAPS
ORAL_CAPSULE | ORAL | 0 refills | Status: DC
Start: 2023-04-03 — End: 2023-04-30

## 2023-04-03 NOTE — Telephone Encounter (Signed)
Chart reviewed. Revlimid refilled per last office note with Dr. Katragadda.  

## 2023-04-30 ENCOUNTER — Other Ambulatory Visit: Payer: Self-pay | Admitting: Hematology

## 2023-04-30 DIAGNOSIS — C9002 Multiple myeloma in relapse: Secondary | ICD-10-CM

## 2023-04-30 NOTE — Telephone Encounter (Signed)
Chart reviewed. Revlimid refilled per last office note with Dr. Katragadda.  

## 2023-05-25 ENCOUNTER — Other Ambulatory Visit: Payer: Self-pay | Admitting: *Deleted

## 2023-05-25 ENCOUNTER — Other Ambulatory Visit: Payer: Self-pay | Admitting: Hematology

## 2023-05-25 DIAGNOSIS — C9002 Multiple myeloma in relapse: Secondary | ICD-10-CM

## 2023-05-25 MED ORDER — LENALIDOMIDE 15 MG PO CAPS: ORAL_CAPSULE | ORAL | 0 refills | Status: DC

## 2023-05-27 ENCOUNTER — Inpatient Hospital Stay: Payer: Medicare Other | Attending: Hematology

## 2023-05-27 DIAGNOSIS — D696 Thrombocytopenia, unspecified: Secondary | ICD-10-CM | POA: Insufficient documentation

## 2023-05-27 DIAGNOSIS — M549 Dorsalgia, unspecified: Secondary | ICD-10-CM | POA: Diagnosis not present

## 2023-05-27 DIAGNOSIS — Z7982 Long term (current) use of aspirin: Secondary | ICD-10-CM | POA: Diagnosis not present

## 2023-05-27 DIAGNOSIS — C9002 Multiple myeloma in relapse: Secondary | ICD-10-CM | POA: Insufficient documentation

## 2023-05-27 DIAGNOSIS — G8929 Other chronic pain: Secondary | ICD-10-CM | POA: Diagnosis not present

## 2023-05-27 DIAGNOSIS — D509 Iron deficiency anemia, unspecified: Secondary | ICD-10-CM | POA: Diagnosis not present

## 2023-05-27 DIAGNOSIS — C9001 Multiple myeloma in remission: Secondary | ICD-10-CM

## 2023-05-27 LAB — COMPREHENSIVE METABOLIC PANEL
ALT: 16 U/L (ref 0–44)
AST: 18 U/L (ref 15–41)
Albumin: 3.9 g/dL (ref 3.5–5.0)
Alkaline Phosphatase: 35 U/L — ABNORMAL LOW (ref 38–126)
Anion gap: 10 (ref 5–15)
BUN: 15 mg/dL (ref 8–23)
CO2: 21 mmol/L — ABNORMAL LOW (ref 22–32)
Calcium: 9.1 mg/dL (ref 8.9–10.3)
Chloride: 108 mmol/L (ref 98–111)
Creatinine, Ser: 0.89 mg/dL (ref 0.61–1.24)
GFR, Estimated: >60 mL/min (ref >60)
Glucose, Bld: 103 mg/dL — ABNORMAL HIGH (ref 70–99)
Potassium: 3.5 mmol/L (ref 3.5–5.1)
Sodium: 139 mmol/L (ref 135–145)
Total Bilirubin: 1.5 mg/dL — ABNORMAL HIGH (ref <1.2)
Total Protein: 6.6 g/dL (ref 6.5–8.1)

## 2023-05-27 LAB — CBC WITH DIFFERENTIAL/PLATELET
Abs Immature Granulocytes: 0.05 10*3/uL (ref 0.00–0.07)
Basophils Absolute: 0 10*3/uL (ref 0.0–0.1)
Basophils Relative: 0 %
Eosinophils Absolute: 0 10*3/uL (ref 0.0–0.5)
Eosinophils Relative: 0 %
HCT: 37.7 % — ABNORMAL LOW (ref 39.0–52.0)
Hemoglobin: 11.8 g/dL — ABNORMAL LOW (ref 13.0–17.0)
Immature Granulocytes: 1 %
Lymphocytes Relative: 15 %
Lymphs Abs: 0.8 10*3/uL (ref 0.7–4.0)
MCH: 25.1 pg — ABNORMAL LOW (ref 26.0–34.0)
MCHC: 31.3 g/dL (ref 30.0–36.0)
MCV: 80.2 fL (ref 80.0–100.0)
Monocytes Absolute: 0.7 10*3/uL (ref 0.1–1.0)
Monocytes Relative: 14 %
Neutro Abs: 3.5 10*3/uL (ref 1.7–7.7)
Neutrophils Relative %: 70 %
Platelets: 108 10*3/uL — ABNORMAL LOW (ref 150–400)
RBC: 4.7 MIL/uL (ref 4.22–5.81)
RDW: 18.9 % — ABNORMAL HIGH (ref 11.5–15.5)
WBC: 5 10*3/uL (ref 4.0–10.5)
nRBC: 0 % (ref 0.0–0.2)

## 2023-05-28 LAB — KAPPA/LAMBDA LIGHT CHAINS
Kappa free light chain: 10.3 mg/L (ref 3.3–19.4)
Kappa, lambda light chain ratio: 1.32 (ref 0.26–1.65)
Lambda free light chains: 7.8 mg/L (ref 5.7–26.3)

## 2023-06-03 ENCOUNTER — Inpatient Hospital Stay: Payer: Medicare Other | Admitting: Hematology

## 2023-06-03 VITALS — BP 146/90 | HR 76 | Temp 97.8°F | Resp 18 | Ht 63.0 in | Wt 167.2 lb

## 2023-06-03 DIAGNOSIS — C9001 Multiple myeloma in remission: Secondary | ICD-10-CM | POA: Diagnosis not present

## 2023-06-03 DIAGNOSIS — C9002 Multiple myeloma in relapse: Secondary | ICD-10-CM | POA: Diagnosis not present

## 2023-06-03 LAB — PROTEIN ELECTROPHORESIS, SERUM
A/G Ratio: 1.6 (ref 0.7–1.7)
Albumin ELP: 3.9 g/dL (ref 2.9–4.4)
Alpha-1-Globulin: 0.2 g/dL (ref 0.0–0.4)
Alpha-2-Globulin: 0.5 g/dL (ref 0.4–1.0)
Beta Globulin: 1.1 g/dL (ref 0.7–1.3)
Gamma Globulin: 0.6 g/dL (ref 0.4–1.8)
Globulin, Total: 2.5 g/dL (ref 2.2–3.9)
Total Protein ELP: 6.4 g/dL (ref 6.0–8.5)

## 2023-06-03 NOTE — Progress Notes (Signed)
[facility] [address], [place] [address]    Clinic Day:  06/03/2023  Referring physician: Kirstie Peri, MD  Patient Care Team: Kirstie Peri, MD as PCP - General (Internal Medicine)   ASSESSMENT & PLAN:   Assessment: 1.  Relapsed kappa light chain multiple myeloma: -Diagnosis on 06/05/2003, presentation with T12 vertebral body fracture, kappa light chain myeloma diagnosed with 2.03 g of light chains/24-hour urine. -06/09/2003 -06/22/2003 radiation 19 Gray total T11-L1, 19 grade T2-T4 for T3 vertebral body involvement. -06/26/2003 chemotherapy with Doxil, Velcade and dexamethasone. 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 -06/22/2008 relapse of myeloma with kappa light chains and 24-hour urine found, treatment initiated with Revlimid/dexamethasone from 07/31/2008 through 08/10/2013. -Radiation therapy 25 centigrade to right hip for impending right femoral neck fracture from 08/27/2010 through 09/10/2010. -PET scan on 09/12/2019 did not show any myeloma lesions. -SPEP and immunofixation on 09/12/2019 was normal.  Kappa light chains have increased to 48.  Ratio has gone up to 4.32 and steadily increasing. -Creatinine and calcium were normal.  LDH was normal. -24-hour urine shows no M spike.  Immunofixation was normal.  Elevated light chain ratio present. -Bone marrow biopsy on 12/02/2019 shows normocellular marrow with plasma cells ranging from 10-20%.  Chromosome analysis was normal.  FISH panel was negative for high-risk abnormalities. -Skeletal survey on 01/02/2020 shows possible new or better demonstrated lucencies in the T5 and T6 vertebral bodies. -Labs on 01/02/2020 shows kappa light chains 118.7, ratio of 11.2, uptrending.  SPEP is negative. -Revlimid 15 mg 3 weeks on/1 week off with weekly dexamethasone 20 mg started on  01/30/2020. - He had Zometa in the past at [facility].  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    Plan: 1.  Relapsed multiple myeloma: - He is tolerating Revlimid without any major problems. - Reportedly had COVID in October with mild symptoms. - Reviewed labs from 07/27/2022: Creatinine and calcium are normal.  M spike is negative.  Free light chain ratio is normal.  Immunofixation is pending. - He is currently taking Revlimid 15 mg 3 weeks on/1 week off along with dexamethasone 16 mg weekly. - He is starting next cycle today.  I plan to decrease Revlimid to 10 mg 3 weeks on/1 week off starting next cycle.  If he continues to be stable, I will also discontinue dexamethasone at next visit.  RTC 3 months with repeat myeloma labs.   2.  Chronic mid back pain: - He is taking methadone 10 mg as needed.  He requires it on the days when he does a lot of walking or bending.   3.  Mild thrombocytopenia: - Mild thrombocytopenia since 2017 is stable.  No bleeding issues reported.  Platelet count is 108.   4.  Thromboprophylaxis: - Continue aspirin 81 mg daily for thromboprophylaxis.  5.  Microcytic anemia: - Last ferritin was 91 and percent saturation 36.  Hemoglobin stable at 11.8.    Orders Placed This Encounter  Procedures   CBC with Differential    Standing Status:   Future    Expected Date:   08/25/2023    Expiration Date:   06/02/2024   Comprehensive metabolic panel    Standing Status:   Future    Expected Date:   08/25/2023    Expiration Date:   06/02/2024   Kappa/lambda light chains    Standing  Status:   Future    Expected Date:   08/25/2023    Expiration Date:   06/02/2024   Immunofixation electrophoresis    Standing Status:   Future    Expected Date:   08/25/2023    Expiration Date:   06/02/2024   Protein electrophoresis, serum    Standing Status:   Future    Expected Date:   08/25/2023    Expiration Date:   06/02/2024      Mikeal Hawthorne R Teague,acting as a scribe for  Doreatha Massed, MD.,have documented all relevant documentation on the behalf of Doreatha Massed, MD,as directed by  Doreatha Massed, MD while in the presence of Doreatha Massed, MD.  I, Doreatha Massed MD, have reviewed the above documentation for accuracy and completeness, and I agree with the above.     Doreatha Massed, MD   12/18/20245:44 PM  CHIEF COMPLAINT:   Diagnosis: multiple myeloma    Cancer Staging  No matching staging information was found for the patient.    Prior Therapy: 1. Auto stem cell transplant at [facility] in 2006. 2. Revlimid maintenance until 2015.  Current Therapy:  Revlimid 3/4 weeks; Decadron weekly    HISTORY OF PRESENT ILLNESS:   Oncology History  Multiple myeloma in remission (HCC)  06/05/2003 Initial Diagnosis   Myeloma, presenting with excruciating back pain and T12 vertebral body fracture, kappa light chain multiple myeloma diagnosed with 2030 mg light chains per 24 hours and urine   06/09/2003 - 06/22/2003 Radiation Therapy   19 Gray to T11-L1 or T12 vertebral body fracture. 19 Gray T2-T4 for T3 vertebral body involvement   06/26/2003 - 07/25/2004 Chemotherapy   Doxil, Velcade, and dexamethasone    02/27/2004 Bone Marrow Biopsy   Normal cellular bone marrow showing trilineage hematopoiesis with less than 10% plasma cells.   07/25/2004 Bone Marrow Transplant   Transplant after high-dose chemotherapy with melphalan 200 mg/m, but no maintenance therapy was given, transplant at [facility].   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  Followed by [facility] Department of dentistry.   06/22/2008 Progression   Relapse with The light chains in 24-hour urine found, therapy reinitiated with Revlimid/dexamethasone.   07/31/2008 - 08/10/2013 Chemotherapy   Revlimid/dexamethasone initiated for recurrent disease.    08/27/2010 - 09/10/2010 Radiation Therapy   2500 cGy to right hip for impending right femoral neck  fracture   08/04/2013 Imaging   Bone density-normal with T score of -1.0.   01/19/2014 Imaging   MRI pelvis-partially visualized right L4-L5 and L5-S1 facet arthritis with periarticular edema. This can be associated with referred pain to the right hip. Small abnormal marrow signal foci in the proximal right femur are likely representing treated myeloma. There is no cortical erosion, stress reaction or stress fracture. Similar lesions are present in the right supra-acetabular ileum.   02/08/2014 Imaging   Bone survey-no significant change in widespread myelomatous involvement of skeleton. Multiple pathologic fractures in the thoracic spine at L1 appears stable. No interval pathologic fracture identified.   12/26/2015 Tumor Marker   M-spike NOT OBSERVED; normal IgG, IgM, IgA, & kappa/lambda light chains.     02/26/2016 Treatment Plan Change   Transfer of medical oncology care to [facility]   02/26/2016 Tumor Marker   M-spike NOT OBSERVED; normal IgG, IgM, IgA, & kappa/lambda light chains.    03/22/2016 Imaging   Skeletal survey imaging: IMPRESSION: Stable appearance of widespread myelomatous involvement of the skeleton. No progressive lesions are observed. Stable  appearing partial compressions of thoracic vertebral bodies. Stable vertebra plana at T12.   05/29/2016 Tumor Marker   M-spike NOT OBSERVED; normal IgG, IgM, IgA, & kappa/lambda light chains.    12/02/2019 Genetic Testing   FISH analysis        INTERVAL HISTORY:   Zong is a 62 y.o. male presenting to clinic today for follow up of multiple myeloma. He was last seen by me on 03/03/23.  Today, he states that he is doing well overall. His appetite level is at 100%. His energy level is at 100%.   PAST MEDICAL HISTORY:   Past Medical History: Past Medical History:  Diagnosis Date   Multiple myeloma in remission (HCC) 11/26/2005    Surgical History: Past Surgical History:  Procedure Laterality Date   BACK  SURGERY     CYSTOSCOPY/URETEROSCOPY/HOLMIUM LASER/STENT PLACEMENT Left 10/09/2020   Procedure: CYSTOSCOPY/LEFT RETROGRADE/URETEROSCOPY/HOLMIUM LASER/LEFT STENT PLACEMENT;  Surgeon: Jerilee Field, MD;  Location: [facility];  Service: Urology;  Laterality: Left;  ONLY NEEDS 60 MIN   porth cath      Social History: Social History   Socioeconomic History   Marital status: Married    Spouse name: Not on file   Number of children: Not on file   Years of education: Not on file   Highest education level: Not on file  Occupational History   Not on file  Tobacco Use   Smoking status: Never   Smokeless tobacco: Never  Vaping Use   Vaping status: Never Used  Substance and Sexual Activity   Alcohol use: No   Drug use: No   Sexual activity: Not on file  Other Topics Concern   Not on file  Social History Narrative   Not on file   Social Drivers of Health   Financial Resource Strain: Low Risk  (05/14/2020)   Overall Financial Resource Strain (CARDIA)    Difficulty of Paying Living Expenses: Not hard at all  Food Insecurity: No Food Insecurity (05/14/2020)   Hunger Vital Sign    Worried About Running Out of Food in the Last Year: Never true    Ran Out of Food in the Last Year: Never true  Transportation Needs: No Transportation Needs (05/14/2020)   PRAPARE - Administrator, Civil Service (Medical): No    Lack of Transportation (Non-Medical): No  Physical Activity: Inactive (05/14/2020)   Exercise Vital Sign    Days of Exercise per Week: 0 days    Minutes of Exercise per Session: 0 min  Stress: No Stress Concern Present (05/14/2020)   [employer] of Occupational Health - Occupational Stress Questionnaire    Feeling of Stress : Not at all  Social Connections: Moderately Integrated (05/14/2020)   Social Connection and Isolation Panel [NHANES]    Frequency of Communication with Friends and Family: More than three times a week    Frequency of Social  Gatherings with Friends and Family: Twice a week    Attends Religious Services: 1 to 4 times per year    Active Member of [employer] or Organizations: No    Attends Banker Meetings: Never    Marital Status: Married  Catering manager Violence: Not At Risk (05/14/2020)   Humiliation, Afraid, Rape, and Kick questionnaire    Fear of Current or Ex-Partner: No    Emotionally Abused: No    Physically Abused: No    Sexually Abused: No    Family History: Family History  Problem Relation Age of Onset  Diabetes Mother    Cancer Sister     Current Medications:  Current Outpatient Medications:    ASPIRIN 81 PO, Take 81 mg by mouth daily., Disp: , Rfl:    CALCIUM PO, Take 400 mg by mouth daily., Disp: , Rfl:    chlorhexidine (PERIDEX) 0.12 % solution, USE AS DIRECTED 15 MLS IN THE MOUTH OR THROAT 2 (TWO) TIMES DAILY. (Patient taking differently: Use as directed 15 mLs in the mouth or throat daily.), Disp: 473 mL, Rfl: 2   dexamethasone (DECADRON) 4 MG tablet, TAKE 5 TABLETS (20 MG TOTAL) BY MOUTH ONCE A WEEK. TUESDAY, Disp: 80 tablet, Rfl: 4   folic acid (FOLVITE) 1 MG tablet, Take 1 mg by mouth daily., Disp: , Rfl:    KLOR-CON M20 20 MEQ tablet, TAKE 1 TABLET BY MOUTH EVERY DAY, Disp: 90 tablet, Rfl: 1   lenalidomide (REVLIMID) 15 MG capsule, TAKE 1 CAPSULE DAILY FOR 21 DAYS ON AND 7 DAYS OFF, Disp: 21 capsule, Rfl: 0   lidocaine-prilocaine (EMLA) cream, Apply 1 application topically as needed [facility])., Disp: , Rfl:    methadone (DOLOPHINE) 10 MG tablet, Take 1 tablet (10 mg total) by mouth every 12 (twelve) hours., Disp: 60 tablet, Rfl: 0   polyethylene glycol (MIRALAX / GLYCOLAX) packet, Take 17 g by mouth daily as needed for mild constipation or moderate constipation., Disp: , Rfl:    Pramox-PE-Glycerin-Petrolatum 1-0.25-14.4-15 % CREA, Place 1 application rectally daily as needed (Irritation)., Disp: , Rfl:  No current facility-administered medications for this  visit.  Facility-Administered Medications Ordered in Other Visits:    octreotide (SANDOSTATIN LAR) 30 MG IM injection, , , ,    sodium chloride flush (NS) 0.9 % injection 10 mL, 10 mL, Intravenous, PRN, Ellouise Newer, PA-C, 10 mL at 06/19/17 1029   Allergies: Allergies  Allergen Reactions   Contrast Media [Iodinated Contrast Media]     Burning sensation all over body    REVIEW OF SYSTEMS:   Review of Systems  Constitutional:  Negative for chills, fatigue and fever.  HENT:   Negative for lump/mass, mouth sores, nosebleeds, sore throat and trouble swallowing.   Eyes:  Negative for eye problems.  Respiratory:  Negative for cough and shortness of breath.   Cardiovascular:  Negative for chest pain, leg swelling and palpitations.  Gastrointestinal:  Negative for abdominal pain, constipation, diarrhea, nausea and vomiting.  Genitourinary:  Negative for bladder incontinence, difficulty urinating, dysuria, frequency, hematuria and nocturia.   Musculoskeletal:  Negative for arthralgias, back pain, flank pain, myalgias and neck pain.  Skin:  Negative for itching and rash.  Neurological:  Positive for numbness. Negative for dizziness and headaches.  Hematological:  Does not bruise/bleed easily.  Psychiatric/Behavioral:  Negative for depression, sleep disturbance and suicidal ideas. The patient is not nervous/anxious.   All other systems reviewed and are negative.    VITALS:   Blood pressure (!) 146/90, pulse 76, temperature 97.8 F (36.6 C), temperature source Tympanic, resp. rate 18, height 5\' 3"  (1.6 m), weight 167 lb 3.2 oz (75.8 kg), SpO2 100%.  Wt Readings from Last 3 Encounters:  06/03/23 167 lb 3.2 oz (75.8 kg)  03/03/23 166 lb 1.6 oz (75.3 kg)  11/20/22 164 lb (74.4 kg)    Body mass index is 29.62 kg/m.  Performance status (ECOG): 1 - Symptomatic but completely ambulatory  PHYSICAL EXAM:   Physical Exam Vitals and nursing note reviewed. Exam conducted with a chaperone  present.  Constitutional:  Appearance: Normal appearance.  Cardiovascular:     Rate and Rhythm: Normal rate and regular rhythm.     Pulses: Normal pulses.     Heart sounds: Normal heart sounds.  Pulmonary:     Effort: Pulmonary effort is normal.     Breath sounds: Normal breath sounds.  Abdominal:     Palpations: Abdomen is soft. There is no hepatomegaly, splenomegaly or mass.     Tenderness: There is no abdominal tenderness.  Musculoskeletal:     Right lower leg: No edema.     Left lower leg: No edema.  Lymphadenopathy:     Cervical: No cervical adenopathy.     Right cervical: No superficial, deep or posterior cervical adenopathy.    Left cervical: No superficial, deep or posterior cervical adenopathy.     Upper Body:     Right upper body: No supraclavicular or axillary adenopathy.     Left upper body: No supraclavicular or axillary adenopathy.  Neurological:     General: No focal deficit present.     Mental Status: He is alert and oriented to person, place, and time.  Psychiatric:        Mood and Affect: Mood normal.        Behavior: Behavior normal.     LABS:      Latest Ref Rng & Units 05/27/2023    9:53 AM 02/24/2023    9:32 AM 11/13/2022    2:46 PM  CBC  WBC 4.0 - 10.5 K/uL 5.0  3.9  3.6   Hemoglobin 13.0 - 17.0 g/dL 16.1  09.6  04.5   Hematocrit 39.0 - 52.0 % 37.7  42.4  36.9   Platelets 150 - 400 K/uL 108  96  107       Latest Ref Rng & Units 05/27/2023    9:53 AM 02/24/2023    9:32 AM 11/13/2022    2:46 PM  CMP  Glucose 70 - 99 mg/dL 409  811  95   BUN 8 - 23 mg/dL 15  13  14    Creatinine 0.61 - 1.24 mg/dL 9.14  7.82  9.56   Sodium 135 - 145 mmol/L 139  140  137   Potassium 3.5 - 5.1 mmol/L 3.5  3.6  3.4   Chloride 98 - 111 mmol/L 108  110  104   CO2 22 - 32 mmol/L 21  23  25    Calcium 8.9 - 10.3 mg/dL 9.1  9.0  8.3   Total Protein 6.5 - 8.1 g/dL 6.6  6.8  6.3   Total Bilirubin <1.2 mg/dL 1.5  1.9  1.2   Alkaline Phos 38 - 126 U/L 35  38  32    AST 15 - 41 U/L 18  16  18    ALT 0 - 44 U/L 16  15  17       No results found for: "CEA1", "CEA" / No results found for: "CEA1", "CEA" No results found for: "PSA1" No results found for: "CAN199" No results found for: "CAN125"  Lab Results  Component Value Date   TOTALPROTELP 6.4 05/27/2023   ALBUMINELP 3.9 05/27/2023   A1GS 0.2 05/27/2023   A2GS 0.5 05/27/2023   BETS 1.1 05/27/2023   GAMS 0.6 05/27/2023   MSPIKE Not Observed 05/27/2023   SPEI Comment 05/27/2023   Lab Results  Component Value Date   TIBC 408 02/24/2023   TIBC 365 08/04/2022   TIBC 333 02/26/2016   FERRITIN 91 02/24/2023   FERRITIN 78 08/04/2022  FERRITIN 80 02/26/2016   IRONPCTSAT 36 02/24/2023   IRONPCTSAT 23 08/04/2022   IRONPCTSAT 27 02/26/2016   Lab Results  Component Value Date   LDH 122 10/10/2021   LDH 115 04/01/2021   LDH 145 09/18/2020     STUDIES:   No results found.

## 2023-06-03 NOTE — Patient Instructions (Signed)
[facility] at [facility] Discharge Instructions   You were seen and examined today by Dr. Ellin Saba.  He reviewed the results of your lab work which are normal/stable.   Continue Revlimid as prescribed.   Return as scheduled.    Thank you for choosing [facility] at [facility] to provide your oncology and hematology care.  To afford each patient quality time with our provider, please arrive at least 15 minutes before your scheduled appointment time.   If you have a lab appointment with the Cancer Center please come in thru the Main Entrance and check in at the main information desk.  You need to re-schedule your appointment should you arrive 10 or more minutes late.  We strive to give you quality time with our providers, and arriving late affects you and other patients whose appointments are after yours.  Also, if you no show three or more times for appointments you may be dismissed from the clinic at the providers discretion.     Again, thank you for choosing [facility].  Our hope is that these requests will decrease the amount of time that you wait before being seen by our physicians.       _____________________________________________________________  Should you have questions after your visit to [facility], please contact our office at [phone] and follow the prompts.  Our office hours are 8:00 a.m. and 4:30 p.m. Monday - Friday.  Please note that voicemails left after 4:00 p.m. may not be returned until the following business day.  We are closed weekends and major holidays.  You do have access to a nurse 24-7, just call the main number to the clinic [phone] and do not press any options, hold on the line and a nurse will answer the phone.    For prescription refill requests, have your pharmacy contact our office and allow 72 hours.    Due to Covid, you will need to wear a mask upon entering the  hospital. If you do not have a mask, a mask will be given to you at the Main Entrance upon arrival. For doctor visits, patients may have 1 support person age 10 or older with them. For treatment visits, patients can not have anyone with them due to social distancing guidelines and our immunocompromised population.

## 2023-06-03 NOTE — Progress Notes (Signed)
Patient is taking Revlimid as prescribed.  He has not missed any doses and reports no side effects at this time.   

## 2023-06-08 LAB — IMMUNOFIXATION ELECTROPHORESIS
IgA: 158 mg/dL (ref 61–437)
IgG (Immunoglobin G), Serum: 705 mg/dL (ref 603–1613)
IgM (Immunoglobulin M), Srm: 32 mg/dL (ref 20–172)
Total Protein ELP: 6.2 g/dL (ref 6.0–8.5)

## 2023-06-15 ENCOUNTER — Other Ambulatory Visit: Payer: Self-pay | Admitting: *Deleted

## 2023-06-15 DIAGNOSIS — C9001 Multiple myeloma in remission: Secondary | ICD-10-CM

## 2023-06-15 DIAGNOSIS — G8929 Other chronic pain: Secondary | ICD-10-CM

## 2023-06-15 MED ORDER — METHADONE HCL 10 MG PO TABS: 10.0000 mg | ORAL_TABLET | Freq: Two times a day (BID) | ORAL | 0 refills | Status: DC

## 2023-06-22 ENCOUNTER — Other Ambulatory Visit: Payer: Self-pay

## 2023-06-22 DIAGNOSIS — C9002 Multiple myeloma in relapse: Secondary | ICD-10-CM

## 2023-06-22 MED ORDER — LENALIDOMIDE 10 MG PO CAPS: ORAL_CAPSULE | ORAL | 0 refills | Status: DC

## 2023-06-22 NOTE — Telephone Encounter (Signed)
 Chart reviewed. Revlimid refilled per last office note with Dr. Ellin Saba.

## 2023-07-02 ENCOUNTER — Other Ambulatory Visit (HOSPITAL_COMMUNITY): Payer: Self-pay

## 2023-07-02 MED ORDER — LENALIDOMIDE 10 MG PO CAPS: 10.0000 mg | ORAL_CAPSULE | Freq: Every day | ORAL | 0 refills | Status: DC

## 2023-07-02 NOTE — Addendum Note (Signed)
Addended by: Remi Haggard on: 07/02/2023 09:33 AM   Modules accepted: Orders

## 2023-07-02 NOTE — Telephone Encounter (Signed)
Rx originally sent as Brand product, per patient insurance only covering generic. Rx resent for generic product.

## 2023-07-04 ENCOUNTER — Other Ambulatory Visit: Payer: Self-pay | Admitting: Hematology

## 2023-07-04 DIAGNOSIS — C9001 Multiple myeloma in remission: Secondary | ICD-10-CM

## 2023-07-20 ENCOUNTER — Other Ambulatory Visit: Payer: Self-pay

## 2023-07-20 DIAGNOSIS — C9002 Multiple myeloma in relapse: Secondary | ICD-10-CM

## 2023-07-20 MED ORDER — LENALIDOMIDE 10 MG PO CAPS: 10.0000 mg | ORAL_CAPSULE | Freq: Every day | ORAL | 0 refills | Status: DC

## 2023-07-20 NOTE — Telephone Encounter (Signed)
Chart reviewed. Revlimid refilled per last office note with Dr. Katragadda.  

## 2023-07-30 ENCOUNTER — Telehealth: Payer: Self-pay

## 2023-07-30 ENCOUNTER — Other Ambulatory Visit (HOSPITAL_COMMUNITY): Payer: Self-pay

## 2023-07-30 NOTE — Telephone Encounter (Signed)
Oral Oncology Patient Advocate Encounter  Re-authorization   Received notification that prior authorization for Revlimid is required.   PA submitted on 07/30/23  Key [id number] Status is pending     Ardeen Fillers, CPhT Oncology Pharmacy Patient Advocate  [facility]  [phone] (phone) [phone] (fax) 07/30/2023 3:46 PM

## 2023-07-31 ENCOUNTER — Other Ambulatory Visit (HOSPITAL_COMMUNITY): Payer: Self-pay

## 2023-07-31 NOTE — Telephone Encounter (Signed)
Oral Oncology Patient Advocate Encounter  Prior Authorization for Revlimid has been approved.    PA# [id number] Effective dates: 07/30/2023 through 06/15/2024  Patients co-pay is $0.    Ella Bodo, CPhT Oncology Pharmacy Patient Advocate [facility] Direct Number: [phone] Fax: [phone]

## 2023-08-04 ENCOUNTER — Other Ambulatory Visit: Payer: Self-pay | Admitting: *Deleted

## 2023-08-04 DIAGNOSIS — G8929 Other chronic pain: Secondary | ICD-10-CM

## 2023-08-04 DIAGNOSIS — C9001 Multiple myeloma in remission: Secondary | ICD-10-CM

## 2023-08-04 MED ORDER — METHADONE HCL 10 MG PO TABS: 10.0000 mg | ORAL_TABLET | Freq: Two times a day (BID) | ORAL | 0 refills | Status: DC

## 2023-08-08 ENCOUNTER — Other Ambulatory Visit: Payer: Self-pay | Admitting: Hematology

## 2023-08-24 ENCOUNTER — Other Ambulatory Visit: Payer: Self-pay

## 2023-08-24 DIAGNOSIS — C9002 Multiple myeloma in relapse: Secondary | ICD-10-CM

## 2023-08-24 MED ORDER — LENALIDOMIDE 10 MG PO CAPS: 10.0000 mg | ORAL_CAPSULE | Freq: Every day | ORAL | 0 refills | Status: DC

## 2023-08-24 NOTE — Telephone Encounter (Signed)
 Chart reviewed. Revlimid refilled per last office note with Dr. Ellin Saba.

## 2023-08-25 ENCOUNTER — Inpatient Hospital Stay: Payer: Medicare Other | Attending: Hematology

## 2023-08-25 DIAGNOSIS — M549 Dorsalgia, unspecified: Secondary | ICD-10-CM | POA: Insufficient documentation

## 2023-08-25 DIAGNOSIS — G8929 Other chronic pain: Secondary | ICD-10-CM | POA: Diagnosis not present

## 2023-08-25 DIAGNOSIS — C9001 Multiple myeloma in remission: Secondary | ICD-10-CM

## 2023-08-25 DIAGNOSIS — Z7982 Long term (current) use of aspirin: Secondary | ICD-10-CM | POA: Diagnosis not present

## 2023-08-25 DIAGNOSIS — C9002 Multiple myeloma in relapse: Secondary | ICD-10-CM | POA: Insufficient documentation

## 2023-08-25 DIAGNOSIS — D696 Thrombocytopenia, unspecified: Secondary | ICD-10-CM | POA: Diagnosis not present

## 2023-08-25 DIAGNOSIS — D649 Anemia, unspecified: Secondary | ICD-10-CM | POA: Diagnosis not present

## 2023-08-25 LAB — COMPREHENSIVE METABOLIC PANEL
ALT: 16 U/L (ref 0–44)
AST: 20 U/L (ref 15–41)
Albumin: 3.9 g/dL (ref 3.5–5.0)
Alkaline Phosphatase: 34 U/L — ABNORMAL LOW (ref 38–126)
Anion gap: 8 (ref 5–15)
BUN: 16 mg/dL (ref 8–23)
CO2: 22 mmol/L (ref 22–32)
Calcium: 8.8 mg/dL — ABNORMAL LOW (ref 8.9–10.3)
Chloride: 110 mmol/L (ref 98–111)
Creatinine, Ser: 0.95 mg/dL (ref 0.61–1.24)
GFR, Estimated: >60 mL/min (ref >60)
Glucose, Bld: 118 mg/dL — ABNORMAL HIGH (ref 70–99)
Potassium: 3.7 mmol/L (ref 3.5–5.1)
Sodium: 140 mmol/L (ref 135–145)
Total Bilirubin: 1.5 mg/dL — ABNORMAL HIGH (ref 0.0–1.2)
Total Protein: 6.7 g/dL (ref 6.5–8.1)

## 2023-08-25 LAB — CBC WITH DIFFERENTIAL/PLATELET
Abs Immature Granulocytes: 0 10*3/uL (ref 0.00–0.07)
Basophils Absolute: 0 10*3/uL (ref 0.0–0.1)
Basophils Relative: 1 %
Eosinophils Absolute: 0 10*3/uL (ref 0.0–0.5)
Eosinophils Relative: 0 %
HCT: 41.4 % (ref 39.0–52.0)
Hemoglobin: 12.5 g/dL — ABNORMAL LOW (ref 13.0–17.0)
Immature Granulocytes: 0 %
Lymphocytes Relative: 26 %
Lymphs Abs: 0.7 10*3/uL (ref 0.7–4.0)
MCH: 24.3 pg — ABNORMAL LOW (ref 26.0–34.0)
MCHC: 30.2 g/dL (ref 30.0–36.0)
MCV: 80.4 fL (ref 80.0–100.0)
Monocytes Absolute: 0.1 10*3/uL (ref 0.1–1.0)
Monocytes Relative: 4 %
Neutro Abs: 1.7 10*3/uL (ref 1.7–7.7)
Neutrophils Relative %: 69 %
Platelets: 112 10*3/uL — ABNORMAL LOW (ref 150–400)
RBC: 5.15 MIL/uL (ref 4.22–5.81)
RDW: 17.7 % — ABNORMAL HIGH (ref 11.5–15.5)
WBC: 2.5 10*3/uL — ABNORMAL LOW (ref 4.0–10.5)
nRBC: 0 % (ref 0.0–0.2)

## 2023-08-26 LAB — KAPPA/LAMBDA LIGHT CHAINS
Kappa free light chain: 12.9 mg/L (ref 3.3–19.4)
Kappa, lambda light chain ratio: 1.61 (ref 0.26–1.65)
Lambda free light chains: 8 mg/L (ref 5.7–26.3)

## 2023-08-26 LAB — PROTEIN ELECTROPHORESIS, SERUM
A/G Ratio: 1.6 (ref 0.7–1.7)
Albumin ELP: 3.8 g/dL (ref 2.9–4.4)
Alpha-1-Globulin: 0.2 g/dL (ref 0.0–0.4)
Alpha-2-Globulin: 0.5 g/dL (ref 0.4–1.0)
Beta Globulin: 1.1 g/dL (ref 0.7–1.3)
Gamma Globulin: 0.6 g/dL (ref 0.4–1.8)
Globulin, Total: 2.4 g/dL (ref 2.2–3.9)
Total Protein ELP: 6.2 g/dL (ref 6.0–8.5)

## 2023-08-26 LAB — IMMUNOFIXATION ELECTROPHORESIS
IgA: 160 mg/dL (ref 61–437)
IgG (Immunoglobin G), Serum: 748 mg/dL (ref 603–1613)
IgM (Immunoglobulin M), Srm: 34 mg/dL (ref 20–172)
Total Protein ELP: 6.2 g/dL (ref 6.0–8.5)

## 2023-09-01 ENCOUNTER — Inpatient Hospital Stay: Payer: Medicare Other | Admitting: Hematology

## 2023-09-01 VITALS — BP 140/88 | HR 79 | Temp 96.8°F | Resp 18 | Wt 164.5 lb

## 2023-09-01 DIAGNOSIS — C9001 Multiple myeloma in remission: Secondary | ICD-10-CM | POA: Diagnosis not present

## 2023-09-01 DIAGNOSIS — C9002 Multiple myeloma in relapse: Secondary | ICD-10-CM | POA: Diagnosis not present

## 2023-09-01 NOTE — Progress Notes (Signed)
 [facility] [address] [place] [address]    Clinic Day:  09/01/2023  Referring physician: Kirstie Peri, MD  Patient Care Team: Kirstie Peri, MD as PCP - General (Internal Medicine)   ASSESSMENT & PLAN:   Assessment: 1.  Relapsed kappa light chain multiple myeloma: -Diagnosis on 06/05/2003, presentation with T12 vertebral body fracture, kappa light chain myeloma diagnosed with 2.03 g of light chains/24-hour urine. -06/09/2003 -06/22/2003 radiation 19 Gray total T11-L1, 19 grade T2-T4 for T3 vertebral body involvement. -06/26/2003 chemotherapy with Doxil, Velcade and dexamethasone. 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 -06/22/2008 relapse of myeloma with kappa light chains and 24-hour urine found, treatment initiated with Revlimid/dexamethasone from 07/31/2008 through 08/10/2013. -Radiation therapy 25 centigrade to right hip for impending right femoral neck fracture from 08/27/2010 through 09/10/2010. -PET scan on 09/12/2019 did not show any myeloma lesions. -SPEP and immunofixation on 09/12/2019 was normal.  Kappa light chains have increased to 48.  Ratio has gone up to 4.32 and steadily increasing. -Creatinine and calcium were normal.  LDH was normal. -24-hour urine shows no M spike.  Immunofixation was normal.  Elevated light chain ratio present. -Bone marrow biopsy on 12/02/2019 shows normocellular marrow with plasma cells ranging from 10-20%.  Chromosome analysis was normal.  FISH panel was negative for high-risk abnormalities. -Skeletal survey on 01/02/2020 shows possible new or better demonstrated lucencies in the T5 and T6 vertebral bodies. -Labs on 01/02/2020 shows kappa light chains 118.7, ratio of 11.2, uptrending.  SPEP is negative. -Revlimid 15 mg 3 weeks on/1 week off with weekly dexamethasone 20 mg started on 01/30/2020.   Revlimid dose reduced to 10 mg 3 weeks on/1 week off in December 2024, dexamethasone discontinued on 09/01/2023 - He had Zometa in the past at [facility].  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    Plan: 1.  Relapsed multiple myeloma: - At last visit I have decreased dose of Revlimid to 10 mg 3 weeks on/1 week off.  He is continuing dexamethasone 16 mg weekly. - Denies any infections.  Reportedly had couple of episodes of burning in the bilateral lower ribs, lasted 1 hour each time.  Does not have any such sensation today. - Labs: Normal LFTs and creatinine.  M spike is negative.  White count is low at 2.5 but normal ANC.  Mild thrombocytopenia.  Hemoglobin 12.5.  Free light chain ratio is normal.  Immunofixation is negative. - Recommend continue Revlimid 10 mg daily 21/7 days schedule.  Discontinue dexamethasone.  RTC 3 months for follow-up with repeat myeloma labs.   2.  Chronic mid back pain: - Continue methadone 10 mg as needed.  Requires 1-2 times per week when he is doing a lot of walking or bending.   3.  Mild thrombocytopenia: - Mild thrombocytopenia since 2017 is stable.  Latest platelet count is 112.  No bleeding issues reported.   4.  Thromboprophylaxis: - Continue aspirin 81 mg daily for thromboprophylaxis.  5.  Normocytic anemia: - Last ferritin was 91 and percent saturation 36.  Hemoglobin today is 12.5.  This is from myelosuppression.    Orders Placed This Encounter  Procedures   CBC with Differential    Standing Status:   Future    Expected Date:   11/23/2023    Expiration Date:   08/31/2024   Comprehensive metabolic panel  Standing Status:   Future    Expected Date:   11/23/2023    Expiration Date:   08/31/2024   Kappa/lambda light chains    Standing Status:   Future    Expected Date:   11/23/2023    Expiration Date:   08/31/2024   Immunofixation electrophoresis    Standing Status:   Future    Expected Date:   11/23/2023    Expiration Date:   08/31/2024   Protein  electrophoresis, serum    Standing Status:   Future    Expected Date:   11/23/2023    Expiration Date:   08/31/2024      Mikeal Hawthorne R Teague,acting as a scribe for Doreatha Massed, MD.,have documented all relevant documentation on the behalf of Doreatha Massed, MD,as directed by  Doreatha Massed, MD while in the presence of Doreatha Massed, MD.  I, Doreatha Massed MD, have reviewed the above documentation for accuracy and completeness, and I agree with the above.      Doreatha Massed, MD   3/18/20252:45 PM  CHIEF COMPLAINT:   Diagnosis: multiple myeloma    Cancer Staging  No matching staging information was found for the patient.    Prior Therapy: 1. 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 2. Revlimid maintenance until 2015.  Current Therapy:  Revlimid 3/4 weeks; Decadron weekly    HISTORY OF PRESENT ILLNESS:   Oncology History  Multiple myeloma in remission (HCC)  06/05/2003 Initial Diagnosis   Myeloma, presenting with excruciating back pain and T12 vertebral body fracture, kappa light chain multiple myeloma diagnosed with 2030 mg light chains per 24 hours and urine   06/09/2003 - 06/22/2003 Radiation Therapy   19 Gray to T11-L1 or T12 vertebral body fracture. 19 Gray T2-T4 for T3 vertebral body involvement   06/26/2003 - 07/25/2004 Chemotherapy   Doxil, Velcade, and dexamethasone    02/27/2004 Bone Marrow Biopsy   Normal cellular bone marrow showing trilineage hematopoiesis with less than 10% plasma cells.   07/25/2004 Bone Marrow Transplant   Transplant after high-dose chemotherapy with melphalan 200 mg/m, but no maintenance therapy was given, transplant at [facility].   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  Followed by [facility] Department of dentistry.   06/22/2008 Progression   Relapse with The light chains in 24-hour urine found, therapy reinitiated with Revlimid/dexamethasone.   07/31/2008 - 08/10/2013  Chemotherapy   Revlimid/dexamethasone initiated for recurrent disease.    08/27/2010 - 09/10/2010 Radiation Therapy   2500 cGy to right hip for impending right femoral neck fracture   08/04/2013 Imaging   Bone density-normal with T score of -1.0.   01/19/2014 Imaging   MRI pelvis-partially visualized right L4-L5 and L5-S1 facet arthritis with periarticular edema. This can be associated with referred pain to the right hip. Small abnormal marrow signal foci in the proximal right femur are likely representing treated myeloma. There is no cortical erosion, stress reaction or stress fracture. Similar lesions are present in the right supra-acetabular ileum.   02/08/2014 Imaging   Bone survey-no significant change in widespread myelomatous involvement of skeleton. Multiple pathologic fractures in the thoracic spine at L1 appears stable. No interval pathologic fracture identified.   12/26/2015 Tumor Marker   M-spike NOT OBSERVED; normal IgG, IgM, IgA, & kappa/lambda light chains.     02/26/2016 Treatment Plan Change   Transfer of medical oncology care to [facility]   02/26/2016 Tumor Marker   M-spike NOT OBSERVED; normal IgG, IgM,  IgA, & kappa/lambda light chains.    03/22/2016 Imaging   Skeletal survey imaging: IMPRESSION: Stable appearance of widespread myelomatous involvement of the skeleton. No progressive lesions are observed. Stable appearing partial compressions of thoracic vertebral bodies. Stable vertebra plana at T12.   05/29/2016 Tumor Marker   M-spike NOT OBSERVED; normal IgG, IgM, IgA, & kappa/lambda light chains.    12/02/2019 Genetic Testing   FISH analysis        INTERVAL HISTORY:   Steven [name] is a 63 y.o. male presenting to clinic today for follow up of multiple myeloma. He was last seen by me on 06/03/23.  Since his last visit, he was seen by endodontics on 08/13/23.   Today, he states that he is doing well overall. His appetite level is at 100%. His energy  level is at 100%.  Cassie is accompanied by a family member. He reports a burning sensation on his bilateral upper rib cage that comes and goes lasting an hour at a time with 2 episodes in the last month. He does note he was taking some sort of OTC supplement while this occurred and stopped taking it 1 week ago. Yanky is taking Revlimid, dexamethasone, and aspirin as prescribed. He is taking methadone prn for chronic back pain.   PAST MEDICAL HISTORY:   Past Medical History: Past Medical History:  Diagnosis Date   Multiple myeloma in remission (HCC) 11/26/2005    Surgical History: Past Surgical History:  Procedure Laterality Date   BACK SURGERY     CYSTOSCOPY/URETEROSCOPY/HOLMIUM LASER/STENT PLACEMENT Left 10/09/2020   Procedure: CYSTOSCOPY/LEFT RETROGRADE/URETEROSCOPY/HOLMIUM LASER/LEFT STENT PLACEMENT;  Surgeon: Jerilee Field, MD;  Location: [facility];  Service: Urology;  Laterality: Left;  ONLY NEEDS 60 MIN   porth cath      Social History: Social History   Socioeconomic History   Marital status: Married    Spouse name: Not on file   Number of children: Not on file   Years of education: Not on file   Highest education level: Not on file  Occupational History   Not on file  Tobacco Use   Smoking status: Never   Smokeless tobacco: Never  Vaping Use   Vaping status: Never Used  Substance and Sexual Activity   Alcohol use: No   Drug use: No   Sexual activity: Not on file  Other Topics Concern   Not on file  Social History Narrative   Not on file   Social Drivers of Health   Financial Resource Strain: Low Risk  (05/14/2020)   Overall Financial Resource Strain (CARDIA)    Difficulty of Paying Living Expenses: Not hard at all  Food Insecurity: No Food Insecurity (05/14/2020)   Hunger Vital Sign    Worried About Running Out of Food in the Last Year: Never true    Ran Out of Food in the Last Year: Never true  Transportation Needs: No  Transportation Needs (05/14/2020)   PRAPARE - Administrator, Civil Service (Medical): No    Lack of Transportation (Non-Medical): No  Physical Activity: Inactive (05/14/2020)   Exercise Vital Sign    Days of Exercise per Week: 0 days    Minutes of Exercise per Session: 0 min  Stress: No Stress Concern Present (05/14/2020)   [employer] of Occupational Health - Occupational Stress Questionnaire    Feeling of Stress : Not at all  Social Connections: Moderately Integrated (05/14/2020)   Social Connection and Isolation Panel [NHANES]    Frequency of  Communication with Friends and Family: More than three times a week    Frequency of Social Gatherings with Friends and Family: Twice a week    Attends Religious Services: 1 to 4 times per year    Active Member of [employer] or Organizations: No    Attends Banker Meetings: Never    Marital Status: Married  Catering manager Violence: Not At Risk (05/14/2020)   Humiliation, Afraid, Rape, and Kick questionnaire    Fear of Current or Ex-Partner: No    Emotionally Abused: No    Physically Abused: No    Sexually Abused: No    Family History: Family History  Problem Relation Age of Onset   Diabetes Mother    Cancer Sister     Current Medications:  Current Outpatient Medications:    ASPIRIN 81 PO, Take 81 mg by mouth daily., Disp: , Rfl:    CALCIUM PO, Take 400 mg by mouth daily., Disp: , Rfl:    chlorhexidine (PERIDEX) 0.12 % solution, USE AS DIRECTED 15 MLS IN THE MOUTH OR THROAT 2 (TWO) TIMES DAILY. (Patient taking differently: Use as directed 15 mLs in the mouth or throat daily.), Disp: 473 mL, Rfl: 2   dexamethasone (DECADRON) 4 MG tablet, TAKE 5 TABLETS (20 MG TOTAL) BY MOUTH ONCE A WEEK ON TUESDAYS, Disp: 60 tablet, Rfl: 6   folic acid (FOLVITE) 1 MG tablet, Take 1 mg by mouth daily., Disp: , Rfl:    KLOR-CON M20 20 MEQ tablet, TAKE 1 TABLET BY MOUTH EVERY DAY, Disp: 90 tablet, Rfl: 1   lenalidomide  (REVLIMID) 10 MG capsule, Take 1 capsule (10 mg total) by mouth daily. Take for 21 days, then hold for 7 days. Repeat every 28 days., Disp: 21 capsule, Rfl: 0   lidocaine-prilocaine (EMLA) cream, Apply 1 application topically as needed [facility])., Disp: , Rfl:    methadone (DOLOPHINE) 10 MG tablet, Take 1 tablet (10 mg total) by mouth every 12 (twelve) hours., Disp: 60 tablet, Rfl: 0   polyethylene glycol (MIRALAX / GLYCOLAX) packet, Take 17 g by mouth daily as needed for mild constipation or moderate constipation., Disp: , Rfl:    Pramox-PE-Glycerin-Petrolatum 1-0.25-14.4-15 % CREA, Place 1 application rectally daily as needed (Irritation)., Disp: , Rfl:  No current facility-administered medications for this visit.  Facility-Administered Medications Ordered in Other Visits:    octreotide (SANDOSTATIN LAR) 30 MG IM injection, , , ,    sodium chloride flush (NS) 0.9 % injection 10 mL, 10 mL, Intravenous, PRN, Ellouise Newer, PA-C, 10 mL at 06/19/17 1029   Allergies: Allergies  Allergen Reactions   Contrast Media [Iodinated Contrast Media]     Burning sensation all over body    REVIEW OF SYSTEMS:   Review of Systems  Constitutional:  Negative for chills, fatigue and fever.  HENT:   Negative for lump/mass, mouth sores, nosebleeds, sore throat and trouble swallowing.   Eyes:  Negative for eye problems.  Respiratory:  Negative for cough and shortness of breath.   Cardiovascular:  Negative for chest pain, leg swelling and palpitations.  Gastrointestinal:  Negative for abdominal pain, constipation, diarrhea, nausea and vomiting.  Genitourinary:  Negative for bladder incontinence, difficulty urinating, dysuria, frequency, hematuria and nocturia.   Musculoskeletal:  Negative for arthralgias, back pain, flank pain, myalgias and neck pain.       +rib burning sensation since medication change  Skin:  Negative for itching and rash.  Neurological:  Negative for dizziness, headaches and numbness.  Hematological:  Does not bruise/bleed easily.  Psychiatric/Behavioral:  Negative for depression, sleep disturbance and suicidal ideas. The patient is not nervous/anxious.   All other systems reviewed and are negative.    VITALS:   Blood pressure (!) 140/88, pulse 79, temperature (!) 96.8 F (36 C), temperature source Tympanic, resp. rate 18, weight 164 lb 7.4 oz (74.6 kg), SpO2 100%.  Wt Readings from Last 3 Encounters:  09/01/23 164 lb 7.4 oz (74.6 kg)  06/03/23 167 lb 3.2 oz (75.8 kg)  03/03/23 166 lb 1.6 oz (75.3 kg)    Body mass index is 29.13 kg/m.  Performance status (ECOG): 1 - Symptomatic but completely ambulatory  PHYSICAL EXAM:   Physical Exam Vitals and nursing note reviewed. Exam conducted with a chaperone present.  Constitutional:      Appearance: Normal appearance.  Cardiovascular:     Rate and Rhythm: Normal rate and regular rhythm.     Pulses: Normal pulses.     Heart sounds: Normal heart sounds.  Pulmonary:     Effort: Pulmonary effort is normal.     Breath sounds: Normal breath sounds.  Abdominal:     Palpations: Abdomen is soft. There is no hepatomegaly, splenomegaly or mass.     Tenderness: There is no abdominal tenderness.  Musculoskeletal:     Right lower leg: No edema.     Left lower leg: No edema.  Lymphadenopathy:     Cervical: No cervical adenopathy.     Right cervical: No superficial, deep or posterior cervical adenopathy.    Left cervical: No superficial, deep or posterior cervical adenopathy.     Upper Body:     Right upper body: No supraclavicular or axillary adenopathy.     Left upper body: No supraclavicular or axillary adenopathy.  Neurological:     General: No focal deficit present.     Mental Status: He is alert and oriented to person, place, and time.  Psychiatric:        Mood and Affect: Mood normal.        Behavior: Behavior normal.     LABS:      Latest Ref Rng & Units 08/25/2023    9:21 AM 05/27/2023    9:53 AM  02/24/2023    9:32 AM  CBC  WBC 4.0 - 10.5 K/uL 2.5  5.0  3.9   Hemoglobin 13.0 - 17.0 g/dL 13.0  86.5  78.4   Hematocrit 39.0 - 52.0 % 41.4  37.7  42.4   Platelets 150 - 400 K/uL 112  108  96       Latest Ref Rng & Units 08/25/2023    9:21 AM 05/27/2023    9:53 AM 02/24/2023    9:32 AM  CMP  Glucose 70 - 99 mg/dL 696  295  284   BUN 8 - 23 mg/dL 16  15  13    Creatinine 0.61 - 1.24 mg/dL 1.32  4.40  1.02   Sodium 135 - 145 mmol/L 140  139  140   Potassium 3.5 - 5.1 mmol/L 3.7  3.5  3.6   Chloride 98 - 111 mmol/L 110  108  110   CO2 22 - 32 mmol/L 22  21  23    Calcium 8.9 - 10.3 mg/dL 8.8  9.1  9.0   Total Protein 6.5 - 8.1 g/dL 6.7  6.6  6.8   Total Bilirubin 0.0 - 1.2 mg/dL 1.5  1.5  1.9   Alkaline Phos 38 - 126 U/L 34  35  38   AST 15 - 41 U/L 20  18  16    ALT 0 - 44 U/L 16  16  15       No results found for: "CEA1", "CEA" / No results found for: "CEA1", "CEA" No results found for: "PSA1" No results found for: "CAN199" No results found for: "CAN125"  Lab Results  Component Value Date   TOTALPROTELP 6.2 08/25/2023   TOTALPROTELP 6.2 08/25/2023   ALBUMINELP 3.8 08/25/2023   A1GS 0.2 08/25/2023   A2GS 0.5 08/25/2023   BETS 1.1 08/25/2023   GAMS 0.6 08/25/2023   MSPIKE Not Observed 08/25/2023   SPEI Comment 08/25/2023   Lab Results  Component Value Date   TIBC 408 02/24/2023   TIBC 365 08/04/2022   TIBC 333 02/26/2016   FERRITIN 91 02/24/2023   FERRITIN 78 08/04/2022   FERRITIN 80 02/26/2016   IRONPCTSAT 36 02/24/2023   IRONPCTSAT 23 08/04/2022   IRONPCTSAT 27 02/26/2016   Lab Results  Component Value Date   LDH 122 10/10/2021   LDH 115 04/01/2021   LDH 145 09/18/2020     STUDIES:   No results found.

## 2023-09-01 NOTE — Progress Notes (Signed)
Patient is taking Revlimid as prescribed.  He has not missed any doses and reports no side effects at this time.   

## 2023-09-01 NOTE — Patient Instructions (Addendum)
 [facility] at [facility] Discharge Instructions   You were seen and examined today by Dr. Ellin Saba.  He reviewed the results of your lab work which are normal/stable.   Continue Revlimid as prescribed. You may stop the steroid (dexamethasone).   We will see you back in . We will repeat lab work prior to this visit.   Return as scheduled.    Thank you for choosing [facility] at [facility] to provide your oncology and hematology care.  To afford each patient quality time with our provider, please arrive at least 15 minutes before your scheduled appointment time.   If you have a lab appointment with the Cancer Center please come in thru the Main Entrance and check in at the main information desk.  You need to re-schedule your appointment should you arrive 10 or more minutes late.  We strive to give you quality time with our providers, and arriving late affects you and other patients whose appointments are after yours.  Also, if you no show three or more times for appointments you may be dismissed from the clinic at the providers discretion.     Again, thank you for choosing [facility].  Our hope is that these requests will decrease the amount of time that you wait before being seen by our physicians.       _____________________________________________________________  Should you have questions after your visit to [facility], please contact our office at [phone] and follow the prompts.  Our office hours are 8:00 a.m. and 4:30 p.m. Monday - Friday.  Please note that voicemails left after 4:00 p.m. may not be returned until the following business day.  We are closed weekends and major holidays.  You do have access to a nurse 24-7, just call the main number to the clinic [phone] and do not press any options, hold on the line and a nurse will answer the phone.    For prescription refill requests, have your  pharmacy contact our office and allow 72 hours.    Due to Covid, you will need to wear a mask upon entering the hospital. If you do not have a mask, a mask will be given to you at the Main Entrance upon arrival. For doctor visits, patients may have 1 support person age 5 or older with them. For treatment visits, patients can not have anyone with them due to social distancing guidelines and our immunocompromised population.

## 2023-09-17 ENCOUNTER — Other Ambulatory Visit: Payer: Self-pay

## 2023-09-17 DIAGNOSIS — C9002 Multiple myeloma in relapse: Secondary | ICD-10-CM

## 2023-09-17 MED ORDER — LENALIDOMIDE 10 MG PO CAPS: 10.0000 mg | ORAL_CAPSULE | Freq: Every day | ORAL | 0 refills | Status: DC

## 2023-09-17 NOTE — Telephone Encounter (Signed)
 Chart reviewed. Revlimid refilled per last office note with Dr. Ellin Saba.

## 2023-10-12 ENCOUNTER — Other Ambulatory Visit: Payer: Self-pay | Admitting: *Deleted

## 2023-10-12 DIAGNOSIS — C9002 Multiple myeloma in relapse: Secondary | ICD-10-CM

## 2023-10-12 MED ORDER — LENALIDOMIDE 10 MG PO CAPS: 10.0000 mg | ORAL_CAPSULE | Freq: Every day | ORAL | 0 refills | Status: DC

## 2023-10-13 ENCOUNTER — Other Ambulatory Visit: Payer: Self-pay | Admitting: Hematology

## 2023-10-13 DIAGNOSIS — C9002 Multiple myeloma in relapse: Secondary | ICD-10-CM

## 2023-11-10 ENCOUNTER — Other Ambulatory Visit: Payer: Self-pay | Admitting: Hematology

## 2023-11-10 DIAGNOSIS — C9002 Multiple myeloma in relapse: Secondary | ICD-10-CM

## 2023-12-02 ENCOUNTER — Inpatient Hospital Stay: Attending: Hematology

## 2023-12-02 DIAGNOSIS — G8929 Other chronic pain: Secondary | ICD-10-CM | POA: Insufficient documentation

## 2023-12-02 DIAGNOSIS — Z7982 Long term (current) use of aspirin: Secondary | ICD-10-CM | POA: Diagnosis not present

## 2023-12-02 DIAGNOSIS — C9002 Multiple myeloma in relapse: Secondary | ICD-10-CM | POA: Insufficient documentation

## 2023-12-02 DIAGNOSIS — D696 Thrombocytopenia, unspecified: Secondary | ICD-10-CM | POA: Diagnosis not present

## 2023-12-02 DIAGNOSIS — D649 Anemia, unspecified: Secondary | ICD-10-CM | POA: Insufficient documentation

## 2023-12-02 DIAGNOSIS — M549 Dorsalgia, unspecified: Secondary | ICD-10-CM | POA: Insufficient documentation

## 2023-12-02 DIAGNOSIS — C9001 Multiple myeloma in remission: Secondary | ICD-10-CM

## 2023-12-02 LAB — CBC WITH DIFFERENTIAL/PLATELET
Abs Immature Granulocytes: 0.01 10*3/uL (ref 0.00–0.07)
Basophils Absolute: 0 10*3/uL (ref 0.0–0.1)
Basophils Relative: 0 %
Eosinophils Absolute: 0 10*3/uL (ref 0.0–0.5)
Eosinophils Relative: 0 %
HCT: 40.8 % (ref 39.0–52.0)
Hemoglobin: 12.8 g/dL — ABNORMAL LOW (ref 13.0–17.0)
Immature Granulocytes: 0 %
Lymphocytes Relative: 22 %
Lymphs Abs: 1 10*3/uL (ref 0.7–4.0)
MCH: 24.3 pg — ABNORMAL LOW (ref 26.0–34.0)
MCHC: 31.4 g/dL (ref 30.0–36.0)
MCV: 77.6 fL — ABNORMAL LOW (ref 80.0–100.0)
Monocytes Absolute: 0.6 10*3/uL (ref 0.1–1.0)
Monocytes Relative: 12 %
Neutro Abs: 3.1 10*3/uL (ref 1.7–7.7)
Neutrophils Relative %: 66 %
Platelets: 110 10*3/uL — ABNORMAL LOW (ref 150–400)
RBC: 5.26 MIL/uL (ref 4.22–5.81)
RDW: 19.7 % — ABNORMAL HIGH (ref 11.5–15.5)
WBC: 4.7 10*3/uL (ref 4.0–10.5)
nRBC: 0 % (ref 0.0–0.2)

## 2023-12-02 LAB — COMPREHENSIVE METABOLIC PANEL WITH GFR
ALT: 14 U/L (ref 0–44)
AST: 21 U/L (ref 15–41)
Albumin: 3.9 g/dL (ref 3.5–5.0)
Alkaline Phosphatase: 36 U/L — ABNORMAL LOW (ref 38–126)
Anion gap: 11 (ref 5–15)
BUN: 20 mg/dL (ref 8–23)
CO2: 22 mmol/L (ref 22–32)
Calcium: 9.2 mg/dL (ref 8.9–10.3)
Chloride: 108 mmol/L (ref 98–111)
Creatinine, Ser: 1.02 mg/dL (ref 0.61–1.24)
GFR, Estimated: >60 mL/min (ref >60)
Glucose, Bld: 97 mg/dL (ref 70–99)
Potassium: 3.9 mmol/L (ref 3.5–5.1)
Sodium: 141 mmol/L (ref 135–145)
Total Bilirubin: 1.2 mg/dL (ref 0.0–1.2)
Total Protein: 6.7 g/dL (ref 6.5–8.1)

## 2023-12-03 LAB — PROTEIN ELECTROPHORESIS, SERUM
A/G Ratio: 1.6 (ref 0.7–1.7)
Albumin ELP: 3.8 g/dL (ref 2.9–4.4)
Alpha-1-Globulin: 0.2 g/dL (ref 0.0–0.4)
Alpha-2-Globulin: 0.5 g/dL (ref 0.4–1.0)
Beta Globulin: 1.1 g/dL (ref 0.7–1.3)
Gamma Globulin: 0.7 g/dL (ref 0.4–1.8)
Globulin, Total: 2.4 g/dL (ref 2.2–3.9)
Total Protein ELP: 6.2 g/dL (ref 6.0–8.5)

## 2023-12-03 LAB — KAPPA/LAMBDA LIGHT CHAINS
Kappa free light chain: 10.9 mg/L (ref 3.3–19.4)
Kappa, lambda light chain ratio: 1.63 (ref 0.26–1.65)
Lambda free light chains: 6.7 mg/L (ref 5.7–26.3)

## 2023-12-04 LAB — IMMUNOFIXATION ELECTROPHORESIS
IgA: 148 mg/dL (ref 61–437)
IgG (Immunoglobin G), Serum: 741 mg/dL (ref 603–1613)
IgM (Immunoglobulin M), Srm: 28 mg/dL (ref 20–172)
Total Protein ELP: 6.3 g/dL (ref 6.0–8.5)

## 2023-12-07 ENCOUNTER — Other Ambulatory Visit: Payer: Self-pay | Admitting: *Deleted

## 2023-12-07 DIAGNOSIS — C9002 Multiple myeloma in relapse: Secondary | ICD-10-CM

## 2023-12-07 MED ORDER — REVLIMID 10 MG PO CAPS: 10.0000 mg | ORAL_CAPSULE | Freq: Every day | ORAL | 0 refills | Status: DC

## 2023-12-09 ENCOUNTER — Inpatient Hospital Stay: Admitting: Hematology

## 2023-12-09 VITALS — BP 139/73 | HR 62 | Temp 98.2°F | Resp 16 | Wt 166.8 lb

## 2023-12-09 DIAGNOSIS — C9001 Multiple myeloma in remission: Secondary | ICD-10-CM | POA: Diagnosis not present

## 2023-12-09 DIAGNOSIS — C9002 Multiple myeloma in relapse: Secondary | ICD-10-CM | POA: Diagnosis not present

## 2023-12-09 NOTE — Patient Instructions (Addendum)
 [facility]  Discharge Instructions  You were seen and examined today by Dr. Rogers.  Dr. Rogers discussed your most recent lab work which revealed that everything looks good and stable.  Continue taking Revlimid  as prescribed.  Follow-up as scheduled.    Thank you for choosing [facility] to provide your oncology and hematology care.   To afford each patient quality time with our provider, please arrive at least 15 minutes before your scheduled appointment time. You may need to reschedule your appointment if you arrive late (10 or more minutes). Arriving late affects you and other patients whose appointments are after yours.  Also, if you miss three or more appointments without notifying the office, you may be dismissed from the clinic at the provider's discretion.    Again, thank you for choosing [facility].  Our hope is that these requests will decrease the amount of time that you wait before being seen by our physicians.   If you have a lab appointment with the Cancer Center - please note that after April 8th, all labs will be drawn in the cancer center.  You do not have to check in or register with the main entrance as you have in the past but will complete your check-in at the cancer center.            _____________________________________________________________  Should you have questions after your visit to [facility], please contact our office at [phone] and follow the prompts.  Our office hours are 8:00 a.m. to 4:30 p.m. Monday - Thursday and 8:00 a.m. to 2:30 p.m. Friday.  Please note that voicemails left after 4:00 p.m. may not be returned until the following business day.  We are closed weekends and all major holidays.  You do have access to a nurse 24-7, just call the main number to the clinic [phone] and do not press any options, hold on the line and a nurse will answer the  phone.    For prescription refill requests, have your pharmacy contact our office and allow 72 hours.    Masks are no longer required in the cancer centers. If you would like for your care team to wear a mask while they are taking care of you, please let them know. You may have one support person who is at least 63 years old accompany you for your appointments.

## 2023-12-09 NOTE — Progress Notes (Signed)
 [facility] [address] [place] [address]    Clinic Day:  12/09/2023  Referring physician: Maree Isles, MD  Patient Care Team: Maree Isles, MD as PCP - General (Internal Medicine)   ASSESSMENT & PLAN:   Assessment: 1.  Relapsed kappa light chain multiple myeloma: -Diagnosis on 06/05/2003, presentation with T12 vertebral body fracture, kappa light chain myeloma diagnosed with 2.03 g of light chains/24-hour urine. -06/09/2003 -06/22/2003 radiation 19 Gray total T11-L1, 19 grade T2-T4 for T3 vertebral body involvement. -06/26/2003 chemotherapy with Doxil, Velcade and dexamethasone . 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 -06/22/2008 relapse of myeloma with kappa light chains and 24-hour urine found, treatment initiated with Revlimid /dexamethasone  from 07/31/2008 through 08/10/2013. -Radiation therapy 25 centigrade to right hip for impending right femoral neck fracture from 08/27/2010 through 09/10/2010. -PET scan on 09/12/2019 did not show any myeloma lesions. -SPEP and immunofixation on 09/12/2019 was normal.  Kappa light chains have increased to 48.  Ratio has gone up to 4.32 and steadily increasing. -Creatinine and calcium were normal.  LDH was normal. -24-hour urine shows no M spike.  Immunofixation was normal.  Elevated light chain ratio present. -Bone marrow biopsy on 12/02/2019 shows normocellular marrow with plasma cells ranging from 10-20%.  Chromosome analysis was normal.  FISH panel was negative for high-risk abnormalities. -Skeletal survey on 01/02/2020 shows possible new or better demonstrated lucencies in the T5 and T6 vertebral bodies. -Labs on 01/02/2020 shows kappa light chains 118.7, ratio of 11.2, uptrending.  SPEP is negative. -Revlimid  15 mg 3 weeks on/1 week off with weekly dexamethasone  20 mg started on 01/30/2020.   Revlimid  dose reduced to 10 mg 3 weeks on/1 week off in December 2024, dexamethasone  discontinued on 09/01/2023 - He had Zometa in the past at [facility].  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    Plan: 1.  Relapsed multiple myeloma: - At last visit 3 months ago, I have discontinued dexamethasone  weekly dose. - He felt sore for few days which improved. - Denies any infections.  Denies any new onset pains. - Labs from 12/02/2023: M spike is negative.  FLC ratio is normal.  Immunofixation was normal.  LFTs were normal.  Creatinine and calcium are normal.  CBC shows normal white count and mild anemia and thrombocytopenia which are stable. - Continue Revlimid  10 mg 3 weeks on/1 week off until progression.  Recommend follow-up in 3 months with repeat myeloma labs.   2.  Chronic mid back pain: - He is trying to wean himself off of methadone .  He is taking methadone  10 mg as needed when he does activity.  He is requiring 1 to 2 pills/week.   3.  Mild thrombocytopenia: - Mild thrombocytopenia since 2017 is stable.  Latest platelet count is stable at 110.   4.  Thromboprophylaxis: - Continue aspirin 81 mg daily for thromboprophylaxis.  5.  Normocytic anemia: - Mild anemia from myelosuppression.  Hemoglobin is 12.8.  Last ferritin was 91 and saturation 36.  Will repeat ferritin and iron panel next visit.    Orders Placed This Encounter  Procedures   Protein electrophoresis, serum    Standing Status:   Future    Expected Date:   03/10/2024    Expiration Date:   06/08/2024    Release to patient:   Immediate   Kappa/lambda light chains    Standing  Status:   Future    Expected Date:   03/10/2024    Expiration Date:   06/08/2024    Release to patient:   Immediate   Immunofixation electrophoresis    Standing Status:   Future    Expected Date:   03/10/2024    Expiration Date:   06/08/2024    Release to patient:   Immediate   CBC with Differential/Platelet    Standing Status:   Future     Expected Date:   03/10/2024    Expiration Date:   06/08/2024    Release to patient:   Immediate   Comprehensive metabolic panel with GFR    Standing Status:   Future    Expected Date:   03/10/2024    Expiration Date:   06/08/2024    Release to patient:   Immediate   Ferritin    Standing Status:   Future    Expected Date:   03/10/2024    Expiration Date:   06/08/2024    Release to patient:   Immediate   Iron and TIBC    Standing Status:   Future    Expected Date:   03/10/2024    Expiration Date:   06/08/2024    Release to patient:   Immediate      I,Helena R Teague,acting as a scribe for Alean Stands, MD.,have documented all relevant documentation on the behalf of Alean Stands, MD,as directed by  Alean Stands, MD while in the presence of Alean Stands, MD.  I, Alean Stands MD, have reviewed the above documentation for accuracy and completeness, and I agree with the above.      Alean Stands, MD   6/25/20253:35 PM  CHIEF COMPLAINT:   Diagnosis: multiple myeloma    Cancer Staging  No matching staging information was found for the patient.    Prior Therapy: 1. 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 2. Revlimid  maintenance until 2015.  Current Therapy:  Revlimid  3/4 weeks; Decadron  weekly    HISTORY OF PRESENT ILLNESS:   Oncology History  Multiple myeloma in remission (HCC)  06/05/2003 Initial Diagnosis   Myeloma, presenting with excruciating back pain and T12 vertebral body fracture, kappa light chain multiple myeloma diagnosed with 2030 mg light chains per 24 hours and urine   06/09/2003 - 06/22/2003 Radiation Therapy   19 Gray to T11-L1 or T12 vertebral body fracture. 19 Gray T2-T4 for T3 vertebral body involvement   06/26/2003 - 07/25/2004 Chemotherapy   Doxil, Velcade, and dexamethasone     02/27/2004 Bone Marrow Biopsy   Normal cellular bone marrow showing trilineage hematopoiesis with less than 10% plasma cells.    07/25/2004 Bone Marrow Transplant   Transplant after high-dose chemotherapy with melphalan 200 mg/m, but no maintenance therapy was given, transplant at [facility].   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  Followed by [facility] Department of dentistry.   06/22/2008 Progression   Relapse with The light chains in 24-hour urine found, therapy reinitiated with Revlimid /dexamethasone .   07/31/2008 - 08/10/2013 Chemotherapy   Revlimid /dexamethasone  initiated for recurrent disease.    08/27/2010 - 09/10/2010 Radiation Therapy   2500 cGy to right hip for impending right femoral neck fracture   08/04/2013 Imaging   Bone density-normal with T score of -1.0.   01/19/2014 Imaging   MRI pelvis-partially visualized right L4-L5 and L5-S1 facet arthritis with periarticular edema. This can be associated with referred pain to the right hip. Small abnormal marrow signal foci in  the proximal right femur are likely representing treated myeloma. There is no cortical erosion, stress reaction or stress fracture. Similar lesions are present in the right supra-acetabular ileum.   02/08/2014 Imaging   Bone survey-no significant change in widespread myelomatous involvement of skeleton. Multiple pathologic fractures in the thoracic spine at L1 appears stable. No interval pathologic fracture identified.   12/26/2015 Tumor Marker   M-spike NOT OBSERVED; normal IgG, IgM, IgA, & kappa/lambda light chains.     02/26/2016 Treatment Plan Change   Transfer of medical oncology care to [facility]   02/26/2016 Tumor Marker   M-spike NOT OBSERVED; normal IgG, IgM, IgA, & kappa/lambda light chains.    03/22/2016 Imaging   Skeletal survey imaging: IMPRESSION: Stable appearance of widespread myelomatous involvement of the skeleton. No progressive lesions are observed. Stable appearing partial compressions of thoracic vertebral bodies. Stable vertebra plana at T12.   05/29/2016 Tumor Marker    M-spike NOT OBSERVED; normal IgG, IgM, IgA, & kappa/lambda light chains.    12/02/2019 Genetic Testing   FISH analysis        INTERVAL HISTORY:   Daiquan is a 63 y.o. male presenting to clinic today for follow up of multiple myeloma. He was last seen by me on 09/01/23.  Today, he states that he is doing well overall. His appetite level is at 100%. His energy level is at 100%.  Damar is accompanied by a family member.   PAST MEDICAL HISTORY:   Past Medical History: Past Medical History:  Diagnosis Date   Multiple myeloma in remission (HCC) 11/26/2005    Surgical History: Past Surgical History:  Procedure Laterality Date   BACK SURGERY     CYSTOSCOPY/URETEROSCOPY/HOLMIUM LASER/STENT PLACEMENT Left 10/09/2020   Procedure: CYSTOSCOPY/LEFT RETROGRADE/URETEROSCOPY/HOLMIUM LASER/LEFT STENT PLACEMENT;  Surgeon: Nieves Cough, MD;  Location: [facility];  Service: Urology;  Laterality: Left;  ONLY NEEDS 60 MIN   porth cath      Social History: Social History   Socioeconomic History   Marital status: Married    Spouse name: Not on file   Number of children: Not on file   Years of education: Not on file   Highest education level: Not on file  Occupational History   Not on file  Tobacco Use   Smoking status: Never   Smokeless tobacco: Never  Vaping Use   Vaping status: Never Used  Substance and Sexual Activity   Alcohol use: No   Drug use: No   Sexual activity: Not on file  Other Topics Concern   Not on file  Social History Narrative   Not on file   Social Drivers of Health   Financial Resource Strain: Low Risk  (05/14/2020)   Overall Financial Resource Strain (CARDIA)    Difficulty of Paying Living Expenses: Not hard at all  Food Insecurity: No Food Insecurity (05/14/2020)   Hunger Vital Sign    Worried About Running Out of Food in the Last Year: Never true    Ran Out of Food in the Last Year: Never true  Transportation Needs: No Transportation  Needs (05/14/2020)   PRAPARE - Administrator, Civil Service (Medical): No    Lack of Transportation (Non-Medical): No  Physical Activity: Inactive (05/14/2020)   Exercise Vital Sign    Days of Exercise per Week: 0 days    Minutes of Exercise per Session: 0 min  Stress: No Stress Concern Present (05/14/2020)   [employer] of Occupational Health - Occupational  Stress Questionnaire    Feeling of Stress : Not at all  Social Connections: Moderately Integrated (05/14/2020)   Social Connection and Isolation Panel    Frequency of Communication with Friends and Family: More than three times a week    Frequency of Social Gatherings with Friends and Family: Twice a week    Attends Religious Services: 1 to 4 times per year    Active Member of [employer] or Organizations: No    Attends Banker Meetings: Never    Marital Status: Married  Catering manager Violence: Not At Risk (05/14/2020)   Humiliation, Afraid, Rape, and Kick questionnaire    Fear of Current or Ex-Partner: No    Emotionally Abused: No    Physically Abused: No    Sexually Abused: No    Family History: Family History  Problem Relation Age of Onset   Diabetes Mother    Cancer Sister     Current Medications:  Current Outpatient Medications:    ASPIRIN 81 PO, Take 81 mg by mouth daily., Disp: , Rfl:    CALCIUM PO, Take 400 mg by mouth daily., Disp: , Rfl:    chlorhexidine  (PERIDEX ) 0.12 % solution, USE AS DIRECTED 15 MLS IN THE MOUTH OR THROAT 2 (TWO) TIMES DAILY. (Patient taking differently: Use as directed 15 mLs in the mouth or throat daily.), Disp: 473 mL, Rfl: 2   dexamethasone  (DECADRON ) 4 MG tablet, TAKE 5 TABLETS (20 MG TOTAL) BY MOUTH ONCE A WEEK ON TUESDAYS, Disp: 60 tablet, Rfl: 6   folic acid (FOLVITE) 1 MG tablet, Take 1 mg by mouth daily., Disp: , Rfl:    KLOR-CON  M20 20 MEQ tablet, TAKE 1 TABLET BY MOUTH EVERY DAY, Disp: 90 tablet, Rfl: 1   lidocaine -prilocaine (EMLA) cream, Apply 1  application topically as needed [facility])., Disp: , Rfl:    methadone  (DOLOPHINE ) 10 MG tablet, Take 1 tablet (10 mg total) by mouth every 12 (twelve) hours., Disp: 60 tablet, Rfl: 0   polyethylene glycol (MIRALAX / GLYCOLAX) packet, Take 17 g by mouth daily as needed for mild constipation or moderate constipation., Disp: , Rfl:    Pramox-PE-Glycerin-Petrolatum  1-0.25-14.4-15 % CREA, Place 1 application rectally daily as needed (Irritation)., Disp: , Rfl:    REVLIMID  10 MG capsule, Take 1 capsule (10 mg total) by mouth daily. Take 10 mg by mouth daily for 3 weeks in a row with 1 week off. Repeat every 28 days. [employer] Auth # [id number]   Date Obtained: 12/07/2023, Disp: 21 capsule, Rfl: 0 No current facility-administered medications for this visit.  Facility-Administered Medications Ordered in Other Visits:    octreotide  (SANDOSTATIN  LAR) 30 MG IM injection, , , ,    sodium chloride  flush (NS) 0.9 % injection 10 mL, 10 mL, Intravenous, PRN, Kefalas, Thomas S, PA-C, 10 mL at 06/19/17 1029   Allergies: Allergies  Allergen Reactions   Contrast Media [Iodinated Contrast Media]     Burning sensation all over body    REVIEW OF SYSTEMS:   Review of Systems  Constitutional:  Negative for chills, fatigue and fever.  HENT:   Negative for lump/mass, mouth sores, nosebleeds, sore throat and trouble swallowing.   Eyes:  Negative for eye problems.  Respiratory:  Negative for cough and shortness of breath.   Cardiovascular:  Negative for chest pain, leg swelling and palpitations.  Gastrointestinal:  Negative for abdominal pain, constipation, diarrhea, nausea and vomiting.  Genitourinary:  Negative for bladder incontinence, difficulty urinating, dysuria, frequency, hematuria and nocturia.  Musculoskeletal:  Negative for arthralgias, back pain, flank pain, myalgias and neck pain.  Skin:  Negative for itching and rash.  Neurological:  Positive for numbness. Negative for dizziness and headaches.   Hematological:  Does not bruise/bleed easily.  Psychiatric/Behavioral:  Negative for depression, sleep disturbance and suicidal ideas. The patient is not nervous/anxious.   All other systems reviewed and are negative.    VITALS:   Blood pressure 139/73, pulse 62, temperature 98.2 F (36.8 C), temperature source Oral, resp. rate 16, weight 166 lb 12.8 oz (75.7 kg), SpO2 100%.  Wt Readings from Last 3 Encounters:  12/09/23 166 lb 12.8 oz (75.7 kg)  09/01/23 164 lb 7.4 oz (74.6 kg)  06/03/23 167 lb 3.2 oz (75.8 kg)    Body mass index is 29.55 kg/m.  Performance status (ECOG): 1 - Symptomatic but completely ambulatory  PHYSICAL EXAM:   Physical Exam Vitals and nursing note reviewed. Exam conducted with a chaperone present.  Constitutional:      Appearance: Normal appearance.   Cardiovascular:     Rate and Rhythm: Normal rate and regular rhythm.     Pulses: Normal pulses.     Heart sounds: Normal heart sounds.  Pulmonary:     Effort: Pulmonary effort is normal.     Breath sounds: Normal breath sounds.  Abdominal:     Palpations: Abdomen is soft. There is no hepatomegaly, splenomegaly or mass.     Tenderness: There is no abdominal tenderness.   Musculoskeletal:     Right lower leg: No edema.     Left lower leg: No edema.  Lymphadenopathy:     Cervical: No cervical adenopathy.     Right cervical: No superficial, deep or posterior cervical adenopathy.    Left cervical: No superficial, deep or posterior cervical adenopathy.     Upper Body:     Right upper body: No supraclavicular or axillary adenopathy.     Left upper body: No supraclavicular or axillary adenopathy.   Neurological:     General: No focal deficit present.     Mental Status: He is alert and oriented to person, place, and time.   Psychiatric:        Mood and Affect: Mood normal.        Behavior: Behavior normal.     LABS:      Latest Ref Rng & Units 12/02/2023    9:35 AM 08/25/2023    9:21 AM  05/27/2023    9:53 AM  CBC  WBC 4.0 - 10.5 K/uL 4.7  2.5  5.0   Hemoglobin 13.0 - 17.0 g/dL 87.1  87.4  88.1   Hematocrit 39.0 - 52.0 % 40.8  41.4  37.7   Platelets 150 - 400 K/uL 110  112  108       Latest Ref Rng & Units 12/02/2023    9:35 AM 08/25/2023    9:21 AM 05/27/2023    9:53 AM  CMP  Glucose 70 - 99 mg/dL 97  881  896   BUN 8 - 23 mg/dL 20  16  15    Creatinine 0.61 - 1.24 mg/dL 8.97  9.04  9.10   Sodium 135 - 145 mmol/L 141  140  139   Potassium 3.5 - 5.1 mmol/L 3.9  3.7  3.5   Chloride 98 - 111 mmol/L 108  110  108   CO2 22 - 32 mmol/L 22  22  21    Calcium 8.9 - 10.3 mg/dL 9.2  8.8  9.1  Total Protein 6.5 - 8.1 g/dL 6.7  6.7  6.6   Total Bilirubin 0.0 - 1.2 mg/dL 1.2  1.5  1.5   Alkaline Phos 38 - 126 U/L 36  34  35   AST 15 - 41 U/L 21  20  18    ALT 0 - 44 U/L 14  16  16       No results found for: CEA1, CEA / No results found for: CEA1, CEA No results found for: PSA1 No results found for: CAN199 No results found for: RJW874  Lab Results  Component Value Date   TOTALPROTELP 6.3 12/02/2023   TOTALPROTELP 6.2 12/02/2023   ALBUMINELP 3.8 12/02/2023   A1GS 0.2 12/02/2023   A2GS 0.5 12/02/2023   BETS 1.1 12/02/2023   GAMS 0.7 12/02/2023   MSPIKE Not Observed 12/02/2023   SPEI Comment 12/02/2023   Lab Results  Component Value Date   TIBC 408 02/24/2023   TIBC 365 08/04/2022   TIBC 333 02/26/2016   FERRITIN 91 02/24/2023   FERRITIN 78 08/04/2022   FERRITIN 80 02/26/2016   IRONPCTSAT 36 02/24/2023   IRONPCTSAT 23 08/04/2022   IRONPCTSAT 27 02/26/2016   Lab Results  Component Value Date   LDH 122 10/10/2021   LDH 115 04/01/2021   LDH 145 09/18/2020     STUDIES:   No results found.

## 2023-12-26 ENCOUNTER — Other Ambulatory Visit: Payer: Self-pay | Admitting: Hematology

## 2023-12-26 DIAGNOSIS — C9001 Multiple myeloma in remission: Secondary | ICD-10-CM

## 2023-12-31 ENCOUNTER — Other Ambulatory Visit: Payer: Self-pay | Admitting: *Deleted

## 2023-12-31 DIAGNOSIS — C9001 Multiple myeloma in remission: Secondary | ICD-10-CM

## 2023-12-31 DIAGNOSIS — G8929 Other chronic pain: Secondary | ICD-10-CM

## 2023-12-31 MED ORDER — METHADONE HCL 10 MG PO TABS: 10.0000 mg | ORAL_TABLET | Freq: Two times a day (BID) | ORAL | 0 refills | Status: DC

## 2024-01-06 ENCOUNTER — Other Ambulatory Visit: Payer: Self-pay

## 2024-01-06 DIAGNOSIS — C9002 Multiple myeloma in relapse: Secondary | ICD-10-CM

## 2024-01-06 MED ORDER — REVLIMID 10 MG PO CAPS: 10.0000 mg | ORAL_CAPSULE | Freq: Every day | ORAL | 0 refills | Status: DC

## 2024-01-06 NOTE — Telephone Encounter (Signed)
 Chart reviewed. Revlimid refilled per last office note with Dr. Ellin Saba.

## 2024-01-12 LAB — COLOGUARD: COLOGUARD: POSITIVE — AB

## 2024-02-01 ENCOUNTER — Other Ambulatory Visit: Payer: Self-pay

## 2024-02-01 DIAGNOSIS — C9002 Multiple myeloma in relapse: Secondary | ICD-10-CM

## 2024-02-01 MED ORDER — REVLIMID 10 MG PO CAPS: 10.0000 mg | ORAL_CAPSULE | Freq: Every day | ORAL | 0 refills | Status: DC

## 2024-02-01 NOTE — Telephone Encounter (Signed)
 Chart reviewed. Revlimid  refilled per last office note with Dr. Rogers and verbal order from Dr. Davonna.

## 2024-03-02 ENCOUNTER — Other Ambulatory Visit: Payer: Self-pay

## 2024-03-02 DIAGNOSIS — C9002 Multiple myeloma in relapse: Secondary | ICD-10-CM

## 2024-03-02 MED ORDER — REVLIMID 10 MG PO CAPS: 10.0000 mg | ORAL_CAPSULE | Freq: Every day | ORAL | 0 refills | Status: DC

## 2024-03-02 NOTE — Telephone Encounter (Signed)
 Chart reviewed. Revlimid refilled per last office note with Dr. Ellin Saba.

## 2024-03-04 ENCOUNTER — Other Ambulatory Visit

## 2024-03-08 ENCOUNTER — Other Ambulatory Visit: Payer: Self-pay

## 2024-03-09 ENCOUNTER — Inpatient Hospital Stay: Attending: Hematology

## 2024-03-09 DIAGNOSIS — C9001 Multiple myeloma in remission: Secondary | ICD-10-CM

## 2024-03-09 DIAGNOSIS — C9002 Multiple myeloma in relapse: Secondary | ICD-10-CM | POA: Insufficient documentation

## 2024-03-09 DIAGNOSIS — D696 Thrombocytopenia, unspecified: Secondary | ICD-10-CM | POA: Insufficient documentation

## 2024-03-09 DIAGNOSIS — D649 Anemia, unspecified: Secondary | ICD-10-CM | POA: Insufficient documentation

## 2024-03-09 LAB — CBC WITH DIFFERENTIAL/PLATELET
Abs Immature Granulocytes: 0.01 K/uL (ref 0.00–0.07)
Basophils Absolute: 0 K/uL (ref 0.0–0.1)
Basophils Relative: 0 %
Eosinophils Absolute: 0 K/uL (ref 0.0–0.5)
Eosinophils Relative: 0 %
HCT: 39.4 % (ref 39.0–52.0)
Hemoglobin: 12.3 g/dL — ABNORMAL LOW (ref 13.0–17.0)
Immature Granulocytes: 0 %
Lymphocytes Relative: 19 %
Lymphs Abs: 0.5 K/uL — ABNORMAL LOW (ref 0.7–4.0)
MCH: 24.4 pg — ABNORMAL LOW (ref 26.0–34.0)
MCHC: 31.2 g/dL (ref 30.0–36.0)
MCV: 78.2 fL — ABNORMAL LOW (ref 80.0–100.0)
Monocytes Absolute: 0.1 K/uL (ref 0.1–1.0)
Monocytes Relative: 3 %
Neutro Abs: 2.2 K/uL (ref 1.7–7.7)
Neutrophils Relative %: 78 %
Platelets: 125 K/uL — ABNORMAL LOW (ref 150–400)
RBC: 5.04 MIL/uL (ref 4.22–5.81)
RDW: 18.7 % — ABNORMAL HIGH (ref 11.5–15.5)
WBC: 2.8 K/uL — ABNORMAL LOW (ref 4.0–10.5)
nRBC: 0 % (ref 0.0–0.2)

## 2024-03-09 LAB — IRON AND TIBC
Iron: 70 ug/dL (ref 45–182)
Saturation Ratios: 18 % (ref 17.9–39.5)
TIBC: 384 ug/dL (ref 250–450)
UIBC: 314 ug/dL

## 2024-03-09 LAB — COMPREHENSIVE METABOLIC PANEL WITH GFR
ALT: 19 U/L (ref 0–44)
AST: 22 U/L (ref 15–41)
Albumin: 3.9 g/dL (ref 3.5–5.0)
Alkaline Phosphatase: 37 U/L — ABNORMAL LOW (ref 38–126)
Anion gap: 10 (ref 5–15)
BUN: 12 mg/dL (ref 8–23)
CO2: 22 mmol/L (ref 22–32)
Calcium: 9.4 mg/dL (ref 8.9–10.3)
Chloride: 110 mmol/L (ref 98–111)
Creatinine, Ser: 0.9 mg/dL (ref 0.61–1.24)
GFR, Estimated: >60 mL/min (ref >60)
Glucose, Bld: 110 mg/dL — ABNORMAL HIGH (ref 70–99)
Potassium: 3.8 mmol/L (ref 3.5–5.1)
Sodium: 142 mmol/L (ref 135–145)
Total Bilirubin: 1.6 mg/dL — ABNORMAL HIGH (ref 0.0–1.2)
Total Protein: 6.6 g/dL (ref 6.5–8.1)

## 2024-03-09 LAB — FERRITIN: Ferritin: 62 ng/mL (ref 24–336)

## 2024-03-10 LAB — KAPPA/LAMBDA LIGHT CHAINS
Kappa free light chain: 15 mg/L (ref 3.3–19.4)
Kappa, lambda light chain ratio: 1.81 — ABNORMAL HIGH (ref 0.26–1.65)
Lambda free light chains: 8.3 mg/L (ref 5.7–26.3)

## 2024-03-11 ENCOUNTER — Ambulatory Visit: Admitting: Oncology

## 2024-03-11 LAB — PROTEIN ELECTROPHORESIS, SERUM
A/G Ratio: 1.3 (ref 0.7–1.7)
Albumin ELP: 3.8 g/dL (ref 2.9–4.4)
Alpha-1-Globulin: 0.2 g/dL (ref 0.0–0.4)
Alpha-2-Globulin: 0.5 g/dL (ref 0.4–1.0)
Beta Globulin: 1.2 g/dL (ref 0.7–1.3)
Gamma Globulin: 0.9 g/dL (ref 0.4–1.8)
Globulin, Total: 2.9 g/dL (ref 2.2–3.9)
Total Protein ELP: 6.7 g/dL (ref 6.0–8.5)

## 2024-03-12 LAB — IMMUNOFIXATION ELECTROPHORESIS
IgA: 178 mg/dL (ref 61–437)
IgG (Immunoglobin G), Serum: 888 mg/dL (ref 603–1613)
IgM (Immunoglobulin M), Srm: 29 mg/dL (ref 20–172)
Total Protein ELP: 6.5 g/dL (ref 6.0–8.5)

## 2024-03-16 ENCOUNTER — Inpatient Hospital Stay: Attending: Hematology | Admitting: Oncology

## 2024-03-16 VITALS — BP 143/76 | HR 61 | Temp 97.1°F | Resp 18 | Ht 65.0 in | Wt 164.0 lb

## 2024-03-16 DIAGNOSIS — D6959 Other secondary thrombocytopenia: Secondary | ICD-10-CM | POA: Diagnosis not present

## 2024-03-16 DIAGNOSIS — C9002 Multiple myeloma in relapse: Secondary | ICD-10-CM | POA: Diagnosis present

## 2024-03-16 DIAGNOSIS — Z7961 Long term (current) use of immunomodulator: Secondary | ICD-10-CM | POA: Diagnosis not present

## 2024-03-16 DIAGNOSIS — T451X5A Adverse effect of antineoplastic and immunosuppressive drugs, initial encounter: Secondary | ICD-10-CM | POA: Insufficient documentation

## 2024-03-16 DIAGNOSIS — D701 Agranulocytosis secondary to cancer chemotherapy: Secondary | ICD-10-CM | POA: Diagnosis not present

## 2024-03-16 DIAGNOSIS — D508 Other iron deficiency anemias: Secondary | ICD-10-CM | POA: Diagnosis not present

## 2024-03-16 DIAGNOSIS — D6481 Anemia due to antineoplastic chemotherapy: Secondary | ICD-10-CM | POA: Diagnosis not present

## 2024-03-16 DIAGNOSIS — D649 Anemia, unspecified: Secondary | ICD-10-CM | POA: Insufficient documentation

## 2024-03-16 NOTE — Progress Notes (Signed)
 [facility] OFFICE PROGRESS NOTE  Steven Isles, MD  ASSESSMENT & PLAN:    Assessment & Plan Multiple myeloma in relapse [facility]) - Patient is currently on Revlimid  10 mg 3 weeks on 1 week off. -She continues to tolerate well. -Denies any infections. -Reviewed his labs which are unremarkable.  He does have mild thrombocytopenia, neutropenia and anemia which is chronic for him and secondary to Revlimid . -We discussed he should continue Revlimid  3 weeks on 1 week off and we will see him back in 3 months.   Other iron deficiency anemia Slight drop in his ferritin and iron saturations on recent lab draw. He has never tried oral iron supplements.  We discussed taking ferrous gluconate 324 mg tablets every other day.  Will recheck his labs in 3 months.  Please take with orange juice to increase absorption. He denies any melena, hematochezia or bright red blood per rectum.  Orders Placed This Encounter  Procedures   Protein electrophoresis, serum    Standing Status:   Future    Expected Date:   06/16/2024    Expiration Date:   09/14/2024    Release to patient:   Immediate   Kappa/lambda light chains    Standing Status:   Future    Expected Date:   06/16/2024    Expiration Date:   09/14/2024    Release to patient:   Immediate   Immunofixation electrophoresis    Standing Status:   Future    Expected Date:   06/16/2024    Expiration Date:   09/14/2024    Release to patient:   Immediate   CBC with Differential/Platelet    Standing Status:   Future    Expected Date:   06/16/2024    Expiration Date:   09/14/2024    Release to patient:   Immediate   Comprehensive metabolic panel with GFR    Standing Status:   Future    Expected Date:   06/16/2024    Expiration Date:   09/14/2024    Release to patient:   Immediate   Ferritin    Standing Status:   Future    Expected Date:   06/16/2024    Expiration Date:   09/14/2024    Release to patient:   Immediate   Iron and TIBC    Standing Status:    Future    Expected Date:   06/16/2024    Expiration Date:   09/14/2024    Release to patient:   Immediate    INTERVAL HISTORY: Patient returns for follow-up for multiple myeloma not in remission.  He is currently on Revlimid  3 weeks on 1 week off and has been doing well with this.  Denies any recurrent infections.  Denies any recent hospitalizations, surgeries or changes to his baseline health.  Overall feels well.  We reviewed BC, CMP, iron panel, ferritin, kappa lambda light chains and protein electrophoresis.  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 -06/09/2003 -06/22/2003 radiation 19 Gray total T11-L1, 19 grade T2-T4 for T3 vertebral body involvement. -06/26/2003 chemotherapy with Doxil, Velcade and dexamethasone . 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 -06/22/2008 relapse of myeloma with kappa light chains and 24-hour urine found, treatment initiated with Revlimid /dexamethasone  from 07/31/2008  through 08/10/2013. -Radiation therapy 25 centigrade to right hip for impending right femoral neck fracture from 08/27/2010 through 09/10/2010. -PET scan on 09/12/2019 did not show any myeloma lesions. -SPEP and immunofixation on 09/12/2019 was normal.  Kappa light chains have increased to 48.  Ratio has gone up to 4.32 and steadily increasing. -Creatinine and calcium were normal.  LDH was normal. -24-hour urine shows no M spike.  Immunofixation was normal.  Elevated light chain ratio present. -Bone marrow biopsy on 12/02/2019 shows normocellular marrow with plasma cells ranging from 10-20%.  Chromosome analysis was normal.  FISH panel was negative for high-risk abnormalities. -Skeletal survey on  01/02/2020 shows possible new or better demonstrated lucencies in the T5 and T6 vertebral bodies. -Labs on 01/02/2020 shows kappa light chains 118.7, ratio of 11.2, uptrending.  SPEP is negative. -Revlimid  15 mg 3 weeks on/1 week off with weekly dexamethasone  20 mg started on 01/30/2020.  Revlimid  dose reduced to 10 mg 3 weeks on/1 week off in December 2024, dexamethasone  discontinued on 09/01/2023 - He had Zometa in the past at [facility].  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   Multiple myeloma in remission (HCC)  06/05/2003 Initial Diagnosis   Myeloma, presenting with excruciating back pain and T12 vertebral body fracture, kappa light chain multiple myeloma diagnosed with 2030 mg light chains per 24 hours and urine   06/09/2003 - 06/22/2003 Radiation Therapy   19 Gray to T11-L1 or T12 vertebral body fracture. 19 Gray T2-T4 for T3 vertebral body involvement   06/26/2003 - 07/25/2004 Chemotherapy   Doxil, Velcade, and dexamethasone     02/27/2004 Bone Marrow Biopsy   Normal cellular bone marrow showing trilineage hematopoiesis with less than 10% plasma cells.   07/25/2004 Bone Marrow Transplant   Transplant after high-dose chemotherapy with melphalan 200 mg/m, but no maintenance therapy was given, transplant at [facility].   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  Followed by [facility] Department of dentistry.   06/22/2008 Progression   Relapse with The light chains in 24-hour urine found, therapy reinitiated with Revlimid /dexamethasone .   07/31/2008 - 08/10/2013 Chemotherapy   Revlimid /dexamethasone  initiated for recurrent disease.    08/27/2010 - 09/10/2010 Radiation Therapy   2500 cGy to right hip for impending right femoral neck fracture   08/04/2013 Imaging   Bone density-normal with T score of -1.0.   01/19/2014 Imaging   MRI pelvis-partially visualized right L4-L5 and L5-S1 facet arthritis with periarticular edema. This can be associated with referred pain to the  right hip. Small abnormal marrow signal foci in the proximal right femur are likely representing treated myeloma. There is no cortical erosion, stress reaction or stress fracture. Similar lesions are present in the right supra-acetabular ileum.   02/08/2014 Imaging   Bone survey-no significant change in widespread myelomatous involvement of skeleton. Multiple pathologic fractures in the thoracic spine at L1 appears stable. No interval pathologic fracture identified.   12/26/2015 Tumor Marker   M-spike NOT OBSERVED; normal IgG, IgM, IgA, & kappa/lambda light chains.     02/26/2016 Treatment Plan Change   Transfer of medical oncology care to [facility]   02/26/2016 Tumor Marker   M-spike NOT OBSERVED; normal IgG, IgM, IgA, & kappa/lambda light chains.    03/22/2016 Imaging   Skeletal survey imaging: IMPRESSION: Stable appearance of widespread myelomatous involvement of the skeleton. No progressive lesions are observed. Stable appearing partial compressions of thoracic vertebral bodies. Stable vertebra plana at T12.   05/29/2016 Tumor  Marker   M-spike NOT OBSERVED; normal IgG, IgM, IgA, & kappa/lambda light chains.    12/02/2019 Genetic Testing   FISH analysis        CBC    Component Value Date/Time   WBC 2.8 (L) 03/09/2024 0944   RBC 5.04 03/09/2024 0944   HGB 12.3 (L) 03/09/2024 0944   HCT 39.4 03/09/2024 0944   PLT 125 (L) 03/09/2024 0944   MCV 78.2 (L) 03/09/2024 0944   MCH 24.4 (L) 03/09/2024 0944   MCHC 31.2 03/09/2024 0944   RDW 18.7 (H) 03/09/2024 0944   LYMPHSABS 0.5 (L) 03/09/2024 0944   MONOABS 0.1 03/09/2024 0944   EOSABS 0.0 03/09/2024 0944   BASOSABS 0.0 03/09/2024 0944       Latest Ref Rng & Units 03/09/2024    9:44 AM 12/02/2023    9:35 AM 08/25/2023    9:21 AM  CMP  Glucose 70 - 99 mg/dL 889  97  881   BUN 8 - 23 mg/dL 12  20  16    Creatinine 0.61 - 1.24 mg/dL 9.09  8.97  9.04   Sodium 135 - 145 mmol/L 142  141  140   Potassium 3.5 - 5.1  mmol/L 3.8  3.9  3.7   Chloride 98 - 111 mmol/L 110  108  110   CO2 22 - 32 mmol/L 22  22  22    Calcium 8.9 - 10.3 mg/dL 9.4  9.2  8.8   Total Protein 6.5 - 8.1 g/dL 6.6  6.7  6.7   Total Bilirubin 0.0 - 1.2 mg/dL 1.6  1.2  1.5   Alkaline Phos 38 - 126 U/L 37  36  34   AST 15 - 41 U/L 22  21  20    ALT 0 - 44 U/L 19  14  16       Lab Results  Component Value Date   FERRITIN 62 03/09/2024    Vitals:   03/16/24 1414  BP: (!) 143/76  Pulse: 61  Resp: 18  Temp: (!) 97.1 F (36.2 C)  SpO2: 100%    Review of System:  Review of Systems  Neurological:  Positive for tingling and sensory change.    Physical Exam: Physical Exam Constitutional:      Appearance: Normal appearance.  HENT:     Head: Normocephalic and atraumatic.  Eyes:     Pupils: Pupils are equal, round, and reactive to light.  Cardiovascular:     Rate and Rhythm: Normal rate and regular rhythm.     Heart sounds: Normal heart sounds. No murmur heard. Pulmonary:     Effort: Pulmonary effort is normal.     Breath sounds: Normal breath sounds. No wheezing.  Abdominal:     General: Bowel sounds are normal. There is no distension.     Palpations: Abdomen is soft.     Tenderness: There is no abdominal tenderness.  Musculoskeletal:        General: Normal range of motion.     Cervical back: Normal range of motion.  Skin:    General: Skin is warm and dry.     Findings: No rash.  Neurological:     Mental Status: He is alert and oriented to person, place, and time.     Gait: Gait is intact.  Psychiatric:        Mood and Affect: Mood and affect normal.        Cognition and Memory: Memory normal.  Judgment: Judgment normal.      I spent 20 minutes dedicated to the care of this patient (face-to-face and non-face-to-face) on the date of the encounter to include what is described in the assessment and plan.,  Steven Hope, NP 03/16/2024 2:57 PM

## 2024-03-16 NOTE — Assessment & Plan Note (Addendum)
 Slight drop in his ferritin and iron saturations on recent lab draw. He has never tried oral iron supplements.  We discussed taking ferrous gluconate 324 mg tablets every other day.  Will recheck his labs in 3 months.  Please take with orange juice to increase absorption. He denies any melena, hematochezia or bright red blood per rectum.

## 2024-03-16 NOTE — Assessment & Plan Note (Addendum)
-   Patient is currently on Revlimid  10 mg 3 weeks on 1 week off. -She continues to tolerate well. -Denies any infections. -Reviewed his labs which are unremarkable.  He does have mild thrombocytopenia, neutropenia and anemia which is chronic for him and secondary to Revlimid . -We discussed he should continue Revlimid  3 weeks on 1 week off and we will see him back in 3 months.

## 2024-03-16 NOTE — Patient Instructions (Signed)
 Recommend otc Iron supplments- Ferrous gluconate. Another one is found on [employer] and its called Slow Fe. Take iron every other day. May take with stool softner such as Colace and Senakot.   We will re-check in 3 months.

## 2024-03-30 ENCOUNTER — Other Ambulatory Visit: Payer: Self-pay

## 2024-03-30 DIAGNOSIS — C9002 Multiple myeloma in relapse: Secondary | ICD-10-CM

## 2024-03-30 MED ORDER — REVLIMID 10 MG PO CAPS: 10.0000 mg | ORAL_CAPSULE | Freq: Every day | ORAL | 0 refills | Status: DC

## 2024-03-30 NOTE — Telephone Encounter (Signed)
 Chart reviewed. Revlimid  refilled per last office note with Delon Hope, NP.

## 2024-04-27 ENCOUNTER — Other Ambulatory Visit: Payer: Self-pay

## 2024-04-27 DIAGNOSIS — C9002 Multiple myeloma in relapse: Secondary | ICD-10-CM

## 2024-04-27 MED ORDER — REVLIMID 10 MG PO CAPS: 10.0000 mg | ORAL_CAPSULE | Freq: Every day | ORAL | 0 refills | Status: DC

## 2024-04-27 NOTE — Progress Notes (Signed)
 Chart reviewed. Revlimid  refilled per last office note with Delon Hope, NP.

## 2024-05-23 ENCOUNTER — Other Ambulatory Visit: Payer: Self-pay

## 2024-05-23 DIAGNOSIS — C9002 Multiple myeloma in relapse: Secondary | ICD-10-CM

## 2024-05-23 MED ORDER — REVLIMID 10 MG PO CAPS: 10.0000 mg | ORAL_CAPSULE | Freq: Every day | ORAL | 0 refills | Status: DC

## 2024-05-23 NOTE — Progress Notes (Signed)
 Chart reviewed. Revlimid  refilled per last office note with Delon Hope, NP.

## 2024-06-03 ENCOUNTER — Other Ambulatory Visit: Payer: Self-pay

## 2024-06-03 DIAGNOSIS — G8929 Other chronic pain: Secondary | ICD-10-CM

## 2024-06-03 DIAGNOSIS — C9001 Multiple myeloma in remission: Secondary | ICD-10-CM

## 2024-06-03 NOTE — Telephone Encounter (Signed)
 Steven [name], I have never filled methadone  before.  Are we about to fill this?  I saw that you had last reviewed or filled his prescription. Delon Hope, AGNP-C Department of Hematology/Oncology [facility] at [facility]  Phone: [phone]  06/03/2024 12:48 PM

## 2024-06-07 MED ORDER — METHADONE HCL 10 MG PO TABS: 10.0000 mg | ORAL_TABLET | Freq: Every day | ORAL | 0 refills | Status: AC | PRN

## 2024-06-07 NOTE — Telephone Encounter (Addendum)
 Hi Steven [name] - yes, we can prescribe methadone  as part of pain medication plan, but not for use as treatment of opioid addiction.  Looks like this patient has been on it since at least 2017.    I haven't seen the patient before, and I last refilled his methadone  back in 2023 while Dr. Rogers was out of office.  I'll go ahead and write a month supply as per Dr. Charmain last note and w/ PDMP review.  I'm happy to fill while you are out of office, but will defer to you and Dr. Davonna for future doses, since you have had chance to see patient for in-person visit.

## 2024-06-13 ENCOUNTER — Encounter: Payer: Self-pay | Admitting: *Deleted

## 2024-06-17 ENCOUNTER — Other Ambulatory Visit: Payer: Self-pay

## 2024-06-17 DIAGNOSIS — C9001 Multiple myeloma in remission: Secondary | ICD-10-CM

## 2024-06-17 DIAGNOSIS — C9002 Multiple myeloma in relapse: Secondary | ICD-10-CM

## 2024-06-17 MED ORDER — POTASSIUM CHLORIDE CRYS ER 20 MEQ PO TBCR: 20.0000 meq | EXTENDED_RELEASE_TABLET | Freq: Every day | ORAL | 1 refills | Status: AC

## 2024-06-17 MED ORDER — REVLIMID 10 MG PO CAPS: 10.0000 mg | ORAL_CAPSULE | Freq: Every day | ORAL | 0 refills | Status: DC

## 2024-06-17 NOTE — Telephone Encounter (Signed)
 Chart reviewed. Revlimid  refilled per last office note with Delon Hope, NP.

## 2024-06-21 ENCOUNTER — Inpatient Hospital Stay: Attending: Hematology

## 2024-06-21 DIAGNOSIS — C9002 Multiple myeloma in relapse: Secondary | ICD-10-CM

## 2024-06-21 LAB — COMPREHENSIVE METABOLIC PANEL WITH GFR
ALT: 17 U/L (ref 0–44)
AST: 21 U/L (ref 15–41)
Albumin: 4 g/dL (ref 3.5–5.0)
Alkaline Phosphatase: 42 U/L (ref 38–126)
Anion gap: 13 (ref 5–15)
BUN: 13 mg/dL (ref 8–23)
CO2: 23 mmol/L (ref 22–32)
Calcium: 8.9 mg/dL (ref 8.9–10.3)
Chloride: 109 mmol/L (ref 98–111)
Creatinine, Ser: 0.97 mg/dL (ref 0.61–1.24)
GFR, Estimated: >60 mL/min
Glucose, Bld: 86 mg/dL (ref 70–99)
Potassium: 3.4 mmol/L — ABNORMAL LOW (ref 3.5–5.1)
Sodium: 146 mmol/L — ABNORMAL HIGH (ref 135–145)
Total Bilirubin: 0.8 mg/dL (ref 0.0–1.2)
Total Protein: 6.2 g/dL — ABNORMAL LOW (ref 6.5–8.1)

## 2024-06-21 LAB — IRON AND TIBC
Iron: 68 ug/dL (ref 45–182)
Saturation Ratios: 21 % (ref 17.9–39.5)
TIBC: 329 ug/dL (ref 250–450)
UIBC: 261 ug/dL

## 2024-06-21 LAB — CBC WITH DIFFERENTIAL/PLATELET
Abs Immature Granulocytes: 0.02 K/uL (ref 0.00–0.07)
Basophils Absolute: 0 K/uL (ref 0.0–0.1)
Basophils Relative: 1 %
Eosinophils Absolute: 0.1 K/uL (ref 0.0–0.5)
Eosinophils Relative: 3 %
HCT: 39.1 % (ref 39.0–52.0)
Hemoglobin: 12 g/dL — ABNORMAL LOW (ref 13.0–17.0)
Immature Granulocytes: 1 %
Lymphocytes Relative: 41 %
Lymphs Abs: 1.6 K/uL (ref 0.7–4.0)
MCH: 24.1 pg — ABNORMAL LOW (ref 26.0–34.0)
MCHC: 30.7 g/dL (ref 30.0–36.0)
MCV: 78.7 fL — ABNORMAL LOW (ref 80.0–100.0)
Monocytes Absolute: 0.7 K/uL (ref 0.1–1.0)
Monocytes Relative: 18 %
Neutro Abs: 1.4 K/uL — ABNORMAL LOW (ref 1.7–7.7)
Neutrophils Relative %: 36 %
Platelets: 139 K/uL — ABNORMAL LOW (ref 150–400)
RBC: 4.97 MIL/uL (ref 4.22–5.81)
RDW: 18.8 % — ABNORMAL HIGH (ref 11.5–15.5)
WBC: 4 K/uL (ref 4.0–10.5)
nRBC: 0 % (ref 0.0–0.2)

## 2024-06-21 LAB — FERRITIN: Ferritin: 130 ng/mL (ref 24–336)

## 2024-06-22 LAB — KAPPA/LAMBDA LIGHT CHAINS
Kappa free light chain: 16.1 mg/L (ref 3.3–19.4)
Kappa, lambda light chain ratio: 1.68 — ABNORMAL HIGH (ref 0.26–1.65)
Lambda free light chains: 9.6 mg/L (ref 5.7–26.3)

## 2024-06-23 LAB — PROTEIN ELECTROPHORESIS, SERUM
A/G Ratio: 1.6 (ref 0.7–1.7)
Albumin ELP: 3.6 g/dL (ref 2.9–4.4)
Alpha-1-Globulin: 0.2 g/dL (ref 0.0–0.4)
Alpha-2-Globulin: 0.5 g/dL (ref 0.4–1.0)
Beta Globulin: 0.9 g/dL (ref 0.7–1.3)
Gamma Globulin: 0.7 g/dL (ref 0.4–1.8)
Globulin, Total: 2.3 g/dL (ref 2.2–3.9)
Total Protein ELP: 5.9 g/dL — ABNORMAL LOW (ref 6.0–8.5)

## 2024-06-24 ENCOUNTER — Other Ambulatory Visit (HOSPITAL_COMMUNITY): Payer: Self-pay

## 2024-06-24 ENCOUNTER — Telehealth: Payer: Self-pay | Admitting: Pharmacy Technician

## 2024-06-24 DIAGNOSIS — C9002 Multiple myeloma in relapse: Secondary | ICD-10-CM

## 2024-06-24 LAB — IMMUNOFIXATION ELECTROPHORESIS
IgA: 143 mg/dL (ref 61–437)
IgG (Immunoglobin G), Serum: 784 mg/dL (ref 603–1613)
IgM (Immunoglobulin M), Srm: 31 mg/dL (ref 20–172)
Total Protein ELP: 5.9 g/dL — ABNORMAL LOW (ref 6.0–8.5)

## 2024-06-24 MED ORDER — LENALIDOMIDE 10 MG PO CAPS: 10.0000 mg | ORAL_CAPSULE | Freq: Every day | ORAL | 0 refills | Status: DC

## 2024-06-24 NOTE — Telephone Encounter (Signed)
 Oral Oncology Patient Advocate Encounter  After completing a benefits investigation, prior authorization for lenalidomide  is not required at this time through [facility]  Prescription sent on 06/17/2024 was sent for the brand name Revlimid  but the patient's insurance only covers the generic.  Pharmacist, Alyson, will resend the prescription for the generic.  I have spoken with the patient.  Steven [name] ([name]) Chet Burnet, CPhT  [facility], [facility], [facility] - Oral Chemotherapy Patient Advocate Specialist III Phone: [phone]  Fax: [phone]

## 2024-06-24 NOTE — Addendum Note (Signed)
 Addended by: RODGERS RENAEE SAILOR on: 06/24/2024 11:33 AM   Modules accepted: Orders

## 2024-06-24 NOTE — Telephone Encounter (Signed)
 Prescription for generic lenalidomide  sent to [facility]

## 2024-06-28 ENCOUNTER — Inpatient Hospital Stay: Admitting: Oncology

## 2024-06-28 ENCOUNTER — Other Ambulatory Visit: Payer: Self-pay

## 2024-06-28 VITALS — BP 135/82 | HR 70 | Temp 97.8°F | Resp 18 | Wt 165.3 lb

## 2024-06-28 DIAGNOSIS — G8929 Other chronic pain: Secondary | ICD-10-CM

## 2024-06-28 DIAGNOSIS — C9002 Multiple myeloma in relapse: Secondary | ICD-10-CM

## 2024-06-28 DIAGNOSIS — D508 Other iron deficiency anemias: Secondary | ICD-10-CM | POA: Diagnosis not present

## 2024-06-28 DIAGNOSIS — M546 Pain in thoracic spine: Secondary | ICD-10-CM

## 2024-06-28 MED ORDER — LENALIDOMIDE 10 MG PO CAPS: 10.0000 mg | ORAL_CAPSULE | Freq: Every day | ORAL | 0 refills | Status: AC

## 2024-06-28 NOTE — Progress Notes (Unsigned)
Patient is taking Revlimid as prescribed.  She has not missed any doses and reports no side effects at this time.   

## 2024-06-28 NOTE — Progress Notes (Unsigned)
 " Patient Care Team: Maree Isles, MD as PCP - General (Internal Medicine)  Clinic Day:  06/28/2024  Referring physician: Maree Isles, MD   CHIEF COMPLAINT:  CC:  Relapsed kappa light chain multiple myeloma   RAVIN DENARDO 64 y.o. male was transferred to my care after his prior physician has left.   ASSESSMENT & PLAN:   Assessment & Plan: JUD FANGUY  is a 64 y.o. male with multiple myeloma in remission  Assessment and Plan  Multiple myeloma in remission Extensive oncology history below Initial diagnosis in 2004 followed by stem cell transplant 1st relapse in 2010 and stayed on revlimid  and dex until 2015 2nd relapse in 2021 and was started on Revlimid  and dexamethasone . Currently on Revlimid  10mg  3 weeks on/1 week off  -Patient tolerating revlimid  well with no side effects. Continue 10mg  3 weeks on/1 week off -Labs reviewed today: CMP:Na:146, K:3.4, normal creatinine, CBC: Normal WBC and platelets, Hb:12.0, SPEP: No M spike, IFE: unremarkable, normal free light chains -Continue aspirin   Return to clinic in 3 months with labs  Chronic back pain Taking methadone  as needed  Mild anemia Likely secondary to revlimid  use Continue to monitor at this time  Hypernatremia Hypokalemia Likely secondary to recent diarrhea  -Recommended oral hydration -Recommended increasing dietary potassium supplementation   The patient understands the plans discussed today and is in agreement with them.  He knows to contact our office if he develops concerns prior to his next appointment.  40 minutes of total time was spent for this patient encounter, including preparation,review of records,  face-to-face counseling with the patient and coordination of care, physical exam, and documentation of the encounter.    LILLETTE Verneta SAUNDERS Teague,acting as a neurosurgeon for Mickiel Dry, MD.,have documented all relevant documentation on the behalf of Mickiel Dry, MD,as directed by  Mickiel Dry, MD while in the presence of Mickiel Dry, MD.  I, Mickiel Dry MD, have reviewed the above documentation for accuracy and completeness, and I agree with the above.    Mickiel Dry, MD  [facility] [place] - A DEPT OF [facility] [place] [address] [place] [address] Dept: [phone] Dept Fax: [phone]   Orders Placed This Encounter  Procedures   CBC with Differential    Standing Status:   Future    Expected Date:   09/19/2024    Expiration Date:   12/18/2024   Comprehensive metabolic panel    Standing Status:   Future    Expected Date:   09/19/2024    Expiration Date:   12/18/2024   Kappa/lambda light chains    Standing Status:   Future    Expected Date:   09/19/2024    Expiration Date:   12/18/2024   Immunofixation electrophoresis    Standing Status:   Future    Expected Date:   09/19/2024    Expiration Date:   12/18/2024   Protein electrophoresis, serum    Standing Status:   Future    Expected Date:   09/19/2024    Expiration Date:   12/18/2024     ONCOLOGY HISTORY:   I have reviewed his chart and materials related to his cancer extensively and collaborated history with the patient. Summary of oncologic history is as follows:    Diagnosis:  Relapsed kappa light chain multiple myeloma   -06/05/2003: Initial Diagnosis with T12 vertebral body fracture, kappa light chain myeloma diagnosed with 2.03 g of light  chains/24-hour urine  -06/09/2003 - 06/22/2003: RT to T11 - L1 vertebrae and T2 - T4 vertebrae -06/12/2003: Bone Marrow Biopsy.  Pathology: Hypercellular bone marrow (75%) with plasma cell dyscrasia (80% plasma cells by aspirate differential, 70% and 60% plasma cells by CD138 immunohistochemistry of clot and core, respectively, and monoclonal kappa by in-situ hybridization).  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 KFLC elevated at 38.9 with FLC ratio of 3.50 -09/12/2019: PET: No evidence of metabolically active myeloma.  -09/12/2019: SPEP and immunofixation normal. KFLC elevated at 48.0 with FLC ratio of 4.32.  -09/12/2019: Creatinine normal. Calcium normal. LDH normal. Normal Beta-2  microglobulin.  -09/20/2019: UPEP: No M-spike present. Normal immunofixation. Elevated FLC ratio over 69.51. -12/02/2019: Bone Marrow Biopsy.  Pathology: Normocellular bone marrow for age with plasma cell neoplasm. The plasma cells are increased in number representing 7% of all cells in the aspirate.  CD138 performed on clot and biopsy sections highlight the plasma cell component which generally represents 10% or less of the total cellular material in the form of interstitial cells and variably sized cluster.  However, in a few small areas, a higher plasma cell cellularity is seen (10-20%). Blasts not increased.  -Flow cytometry: Predominance of T cells (22% of all cells) with relative abundance of CD8 positive cells. No monoclonal B-cell population identified.   Chromosome Analysis: Normal  MM FISH Panel: Unavailable on EPIC -01/02/2020: Skeletal Survey: Possible new or better demonstrated lucencies in the T5-T6 vertebral bodies. Otherwise, no significant change in lucencies in multiple bones compatible with the patient's known multiple myeloma. -01/02/2020: SPEP and immunofixation normal. Elevated KFLC at 118.7 with FLC ratio of 11.20.   -01/30/2020 - current: Revlimid  15 mg 3 weeks on/1 week off with weekly dexamethasone  -05/2023: Revlimid  dose reduced to 10 mg 3 weeks on/1 week off -09/01/2023: Dexamethasone  discontinued -2021- current: Myeloma stable to improved with normal SPEP and immunofixation. KFLC and FLC ratio normal to slightly elevated.    Current Treatment:  Revlimid  10mg  3 weeks on/ 1 week off  INTERVAL HISTORY:   MANDY FITZWATER is here today for follow up and to establish care with me for relapsed multiple myeloma. Patient is accompanied by his wife today.   Leovanni has no complaints today. He is tolerating Revlimid  well and denies any tiredness. He had one episode of diarrhea last week, which may have contributed to his mildly low potassium today.   He has chronic anemia and is taking oral iron supplements every other day. He has no family history of sickle cell or thalassemia. He is taking calcium, vitamin D supplements, and baby aspirin daily. Remmy's chronic back pain is stable and takes methadone  prn.   I have reviewed the past medical history, past surgical history, social history and family history with the patient and they are unchanged from previous note.  ALLERGIES:  is allergic to contrast media [iodinated contrast media].  MEDICATIONS:  Current Outpatient Medications  Medication Sig Dispense Refill   ASPIRIN 81 PO Take 81 mg by mouth daily.     CALCIUM PO Take 400 mg by mouth daily.  dexamethasone  (DECADRON ) 4 MG tablet TAKE 5 TABLETS (20 MG TOTAL) BY MOUTH ONCE A WEEK ON TUESDAYS 60 tablet 6   ferrous sulfate 324 MG TBEC Take 324 mg by mouth every other day.     folic acid (FOLVITE) 1 MG tablet Take 1 mg by mouth daily.     lenalidomide  (REVLIMID ) 10 MG capsule Take 1 capsule (10 mg total) by mouth daily. Take for 21 days, then hold for 7 days. Repeat every 28 days. 21 capsule 0   lidocaine -prilocaine (EMLA) cream Apply 1 application topically as needed [facility]).     methadone   (DOLOPHINE ) 10 MG tablet Take 1 tablet (10 mg total) by mouth daily as needed for severe pain (pain score 7-10). 30 tablet 0   polyethylene glycol (MIRALAX / GLYCOLAX) packet Take 17 g by mouth daily as needed for mild constipation or moderate constipation.     potassium chloride  SA (KLOR-CON  M) 20 MEQ tablet Take 1 tablet (20 mEq total) by mouth daily. 90 tablet 1   Pramox-PE-Glycerin-Petrolatum  1-0.25-14.4-15 % CREA Place 1 application rectally daily as needed (Irritation).     No current facility-administered medications for this visit.   Facility-Administered Medications Ordered in Other Visits  Medication Dose Route Frequency Provider Last Rate Last Admin   octreotide  (SANDOSTATIN  LAR) 30 MG IM injection            sodium chloride  flush (NS) 0.9 % injection 10 mL  10 mL Intravenous PRN Kefalas, Thomas S, PA-C   10 mL at 06/19/17 1029    VITALS:  Blood pressure 135/82, pulse 70, temperature 97.8 F (36.6 C), temperature source Oral, resp. rate 18, weight 165 lb 5.5 oz (75 kg), SpO2 97%.  Wt Readings from Last 3 Encounters:  06/28/24 165 lb 5.5 oz (75 kg)  03/16/24 164 lb (74.4 kg)  12/09/23 166 lb 12.8 oz (75.7 kg)    Body mass index is 27.51 kg/m.  Performance status (ECOG): 1 - Symptomatic but completely ambulatory  PHYSICAL EXAM:   GENERAL:alert, no distress and comfortable LYMPH:  no palpable lymphadenopathy in the cervical, axillary or inguinal LUNGS: clear to auscultation and percussion with normal breathing effort HEART: regular rate & rhythm and no murmurs and no lower extremity edema ABDOMEN:abdomen soft, non-tender and normal bowel sounds Musculoskeletal:no cyanosis of digits and no clubbing  NEURO: alert & oriented x 3 with fluent speech  LABORATORY DATA:  I have reviewed the data as listed   Lab Results  Component Value Date   WBC 4.0 06/21/2024   NEUTROABS 1.4 (L) 06/21/2024   HGB 12.0 (L) 06/21/2024   HCT 39.1 06/21/2024   MCV 78.7 (L) 06/21/2024   PLT  139 (L) 06/21/2024      Chemistry      Component Value Date/Time   NA 146 (H) 06/21/2024 1024   K 3.4 (L) 06/21/2024 1024   CL 109 06/21/2024 1024   CO2 23 06/21/2024 1024   BUN 13 06/21/2024 1024   CREATININE 0.97 06/21/2024 1024      Component Value Date/Time   CALCIUM 8.9 06/21/2024 1024   ALKPHOS 42 06/21/2024 1024   AST 21 06/21/2024 1024   ALT 17 06/21/2024 1024   BILITOT 0.8 06/21/2024 1024       Latest Reference Range & Units 06/21/24 10:24  Kappa free light chain 3.3 - 19.4 mg/L 16.1  Lambda free light chains 5.7 - 26.3 mg/L 9.6  Kappa, lambda light chain ratio 0.26 - 1.65  1.68 (H)  Immunofixation Result,  Serum  Unremarkable  (H): Data is abnormally high (C): Corrected   Latest Reference Range & Units 06/21/24 10:24  Total Protein ELP 6.0 - 8.5 g/dL 6.0 - 8.5 g/dL 5.9 (L) 5.9 (L)  Albumin ELP 2.9 - 4.4 g/dL 3.6  Globulin, Total 2.2 - 3.9 g/dL 2.3 (C)  A/G Ratio 0.7 - 1.7  1.6 (C)  Alpha-1-Globulin 0.0 - 0.4 g/dL 0.2  Joeyj-7-Honalopw 0.4 - 1.0 g/dL 0.5  Beta Globulin 0.7 - 1.3 g/dL 0.9  Gamma Globulin 0.4 - 1.8 g/dL 0.7  M-SPIKE, % Not Observed g/dL Not Observed  SPE Interp.  Comment  Comment  Comment  IgG (Immunoglobin G), Serum 603 - 1,613 mg/dL 215  IgM (Immunoglobulin M), Srm 20 - 172 mg/dL 31  IgA 61 - 562 mg/dL 856  (L): Data is abnormally low (C): Corrected  RADIOGRAPHIC STUDIES: I have personally reviewed the radiological images as listed and agreed with the findings in the report. "

## 2024-06-28 NOTE — Patient Instructions (Signed)

## 2024-09-19 ENCOUNTER — Inpatient Hospital Stay

## 2024-09-26 ENCOUNTER — Inpatient Hospital Stay: Admitting: Oncology
